# Patient Record
Sex: Female | Born: 1935
Health system: Southern US, Community
[De-identification: ages and names within clinical notes are randomized; demographics above are authoritative.]

## PROBLEM LIST (undated history)

## (undated) DIAGNOSIS — M810 Age-related osteoporosis without current pathological fracture: Secondary | ICD-10-CM

## (undated) DIAGNOSIS — I639 Cerebral infarction, unspecified: Secondary | ICD-10-CM

## (undated) DIAGNOSIS — E119 Type 2 diabetes mellitus without complications: Secondary | ICD-10-CM

## (undated) DIAGNOSIS — F32A Depression, unspecified: Secondary | ICD-10-CM

## (undated) DIAGNOSIS — S52509A Unspecified fracture of the lower end of unspecified radius, initial encounter for closed fracture: Secondary | ICD-10-CM

## (undated) DIAGNOSIS — S329XXA Fracture of unspecified parts of lumbosacral spine and pelvis, initial encounter for closed fracture: Secondary | ICD-10-CM

## (undated) DIAGNOSIS — F329 Major depressive disorder, single episode, unspecified: Secondary | ICD-10-CM

## (undated) DIAGNOSIS — I10 Essential (primary) hypertension: Secondary | ICD-10-CM

## (undated) DIAGNOSIS — M1990 Unspecified osteoarthritis, unspecified site: Secondary | ICD-10-CM

## (undated) DIAGNOSIS — D649 Anemia, unspecified: Secondary | ICD-10-CM

## (undated) DIAGNOSIS — S62101A Fracture of unspecified carpal bone, right wrist, initial encounter for closed fracture: Secondary | ICD-10-CM

## (undated) DIAGNOSIS — E785 Hyperlipidemia, unspecified: Secondary | ICD-10-CM

## (undated) HISTORY — DX: Fracture of unspecified carpal bone, right wrist, initial encounter for closed fracture: S62.101A

## (undated) HISTORY — DX: Major depressive disorder, single episode, unspecified: F32.9

## (undated) HISTORY — DX: Unspecified osteoarthritis, unspecified site: M19.90

## (undated) HISTORY — DX: Age-related osteoporosis without current pathological fracture: M81.0

## (undated) HISTORY — DX: Anemia, unspecified: D64.9

## (undated) HISTORY — DX: Unspecified fracture of the lower end of unspecified radius, initial encounter for closed fracture: S52.509A

## (undated) HISTORY — DX: Essential (primary) hypertension: I10

## (undated) HISTORY — DX: Hyperlipidemia, unspecified: E78.5

## (undated) HISTORY — DX: Depression, unspecified: F32.A

## (undated) HISTORY — DX: Type 2 diabetes mellitus without complications: E11.9

---

## 1958-08-27 HISTORY — PX: ABDOMINAL HYSTERECTOMY: SHX81

## 1977-08-27 DIAGNOSIS — I639 Cerebral infarction, unspecified: Secondary | ICD-10-CM

## 1977-08-27 HISTORY — DX: Cerebral infarction, unspecified: I63.9

## 2001-11-17 ENCOUNTER — Other Ambulatory Visit: Admission: RE | Admit: 2001-11-17 | Discharge: 2001-11-17 | Payer: Self-pay | Admitting: *Deleted

## 2006-07-08 ENCOUNTER — Inpatient Hospital Stay (HOSPITAL_COMMUNITY): Admission: EM | Admit: 2006-07-08 | Discharge: 2006-07-12 | Payer: Self-pay | Admitting: Emergency Medicine

## 2010-06-05 ENCOUNTER — Inpatient Hospital Stay (HOSPITAL_COMMUNITY): Admission: EM | Admit: 2010-06-05 | Discharge: 2010-06-08 | Payer: Self-pay | Admitting: Emergency Medicine

## 2010-06-06 ENCOUNTER — Encounter (INDEPENDENT_AMBULATORY_CARE_PROVIDER_SITE_OTHER): Payer: Self-pay | Admitting: Internal Medicine

## 2010-06-06 ENCOUNTER — Ambulatory Visit: Payer: Self-pay | Admitting: Vascular Surgery

## 2010-06-07 ENCOUNTER — Encounter (INDEPENDENT_AMBULATORY_CARE_PROVIDER_SITE_OTHER): Payer: Self-pay | Admitting: Internal Medicine

## 2010-06-19 ENCOUNTER — Encounter: Admission: RE | Admit: 2010-06-19 | Discharge: 2010-07-07 | Payer: Self-pay | Admitting: Internal Medicine

## 2010-11-09 LAB — COMPREHENSIVE METABOLIC PANEL
ALT: 14 U/L (ref 0–35)
Alkaline Phosphatase: 65 U/L (ref 39–117)
BUN: 10 mg/dL (ref 6–23)
CO2: 20 mEq/L (ref 19–32)
GFR calc non Af Amer: 50 mL/min — ABNORMAL LOW (ref >60)
Glucose, Bld: 177 mg/dL — ABNORMAL HIGH (ref 70–99)
Potassium: 4.4 mEq/L (ref 3.5–5.1)
Sodium: 139 mEq/L (ref 135–145)
Total Bilirubin: 0.6 mg/dL (ref 0.3–1.2)

## 2010-11-09 LAB — GLUCOSE, CAPILLARY
Glucose-Capillary: 117 mg/dL — ABNORMAL HIGH (ref 70–99)
Glucose-Capillary: 138 mg/dL — ABNORMAL HIGH (ref 70–99)
Glucose-Capillary: 194 mg/dL — ABNORMAL HIGH (ref 70–99)

## 2010-11-09 LAB — CBC
HCT: 30.6 % — ABNORMAL LOW (ref 36.0–46.0)
Hemoglobin: 9.8 g/dL — ABNORMAL LOW (ref 12.0–15.0)
MCHC: 32 g/dL (ref 30.0–36.0)
MCV: 95.5 fL (ref 78.0–100.0)
MCV: 95.6 fL (ref 78.0–100.0)
Platelets: 267 10*3/uL (ref 150–400)
RBC: 3.33 MIL/uL — ABNORMAL LOW (ref 3.87–5.11)
RDW: 13.6 % (ref 11.5–15.5)
WBC: 4.8 10*3/uL (ref 4.0–10.5)
WBC: 5.8 10*3/uL (ref 4.0–10.5)

## 2010-11-09 LAB — HEMOGLOBIN A1C
Hgb A1c MFr Bld: 8.1 % — ABNORMAL HIGH (ref <5.7)
Mean Plasma Glucose: 186 mg/dL — ABNORMAL HIGH (ref <117)

## 2010-11-09 LAB — BASIC METABOLIC PANEL
BUN: 13 mg/dL (ref 6–23)
Calcium: 8.4 mg/dL (ref 8.4–10.5)
Calcium: 9.1 mg/dL (ref 8.4–10.5)
Chloride: 111 mEq/L (ref 96–112)
Chloride: 112 mEq/L (ref 96–112)
Creatinine, Ser: 1.07 mg/dL (ref 0.4–1.2)
Creatinine, Ser: 1.09 mg/dL (ref 0.4–1.2)
GFR calc Af Amer: 58 mL/min — ABNORMAL LOW (ref >60)
GFR calc Af Amer: 59 mL/min — ABNORMAL LOW (ref >60)
GFR calc Af Amer: >60 mL/min (ref >60)
GFR calc non Af Amer: 48 mL/min — ABNORMAL LOW (ref >60)
GFR calc non Af Amer: 49 mL/min — ABNORMAL LOW (ref >60)
GFR calc non Af Amer: 51 mL/min — ABNORMAL LOW (ref >60)
Glucose, Bld: 160 mg/dL — ABNORMAL HIGH (ref 70–99)
Potassium: 4.4 mEq/L (ref 3.5–5.1)
Sodium: 137 mEq/L (ref 135–145)
Sodium: 139 mEq/L (ref 135–145)

## 2010-11-09 LAB — URINALYSIS, ROUTINE W REFLEX MICROSCOPIC
Bilirubin Urine: NEGATIVE
Glucose, UA: NEGATIVE mg/dL
Hgb urine dipstick: NEGATIVE
Protein, ur: NEGATIVE mg/dL
Urobilinogen, UA: 0.2 mg/dL (ref 0.0–1.0)

## 2010-11-09 LAB — DIFFERENTIAL
Basophils Absolute: 0 10*3/uL (ref 0.0–0.1)
Eosinophils Relative: 2 % (ref 0–5)
Lymphocytes Relative: 32 % (ref 12–46)
Lymphs Abs: 1.9 10*3/uL (ref 0.7–4.0)
Neutro Abs: 3.3 10*3/uL (ref 1.7–7.7)
Neutrophils Relative %: 58 % (ref 43–77)

## 2010-11-09 LAB — HEPATIC FUNCTION PANEL
Albumin: 3.6 g/dL (ref 3.5–5.2)
Alkaline Phosphatase: 62 U/L (ref 39–117)
Indirect Bilirubin: 0.6 mg/dL (ref 0.3–0.9)
Total Protein: 7 g/dL (ref 6.0–8.3)

## 2010-11-09 LAB — LIPID PANEL
Cholesterol: 162 mg/dL (ref 0–200)
HDL: 51 mg/dL (ref >39)
LDL Cholesterol: 97 mg/dL (ref 0–99)
Total CHOL/HDL Ratio: 3.2 RATIO

## 2010-11-09 LAB — MAGNESIUM: Magnesium: 1.7 mg/dL (ref 1.5–2.5)

## 2010-11-09 LAB — CK TOTAL AND CKMB (NOT AT ARMC)
CK, MB: 2.1 ng/mL (ref 0.3–4.0)
Relative Index: 0.5 (ref 0.0–2.5)
Total CK: 428 U/L — ABNORMAL HIGH (ref 7–177)

## 2010-11-09 LAB — APTT: aPTT: 28 seconds (ref 24–37)

## 2010-11-09 LAB — PROTIME-INR: INR: 1.01 (ref 0.00–1.49)

## 2010-11-09 LAB — AMMONIA: Ammonia: 21 umol/L (ref 11–35)

## 2010-11-26 ENCOUNTER — Emergency Department (HOSPITAL_COMMUNITY): Payer: No Typology Code available for payment source

## 2010-11-26 ENCOUNTER — Emergency Department (HOSPITAL_COMMUNITY)
Admission: EM | Admit: 2010-11-26 | Discharge: 2010-11-26 | Disposition: A | Discharge status: Home / Self Care | Payer: No Typology Code available for payment source | Attending: Emergency Medicine | Admitting: Emergency Medicine

## 2010-11-26 DIAGNOSIS — G8320 Monoplegia of upper limb affecting unspecified side: Secondary | ICD-10-CM | POA: Insufficient documentation

## 2010-11-26 DIAGNOSIS — E119 Type 2 diabetes mellitus without complications: Secondary | ICD-10-CM | POA: Insufficient documentation

## 2010-11-26 DIAGNOSIS — S139XXA Sprain of joints and ligaments of unspecified parts of neck, initial encounter: Secondary | ICD-10-CM | POA: Insufficient documentation

## 2010-11-26 DIAGNOSIS — M542 Cervicalgia: Secondary | ICD-10-CM | POA: Insufficient documentation

## 2010-11-26 DIAGNOSIS — Z8673 Personal history of transient ischemic attack (TIA), and cerebral infarction without residual deficits: Secondary | ICD-10-CM | POA: Insufficient documentation

## 2010-11-26 DIAGNOSIS — I10 Essential (primary) hypertension: Secondary | ICD-10-CM | POA: Insufficient documentation

## 2010-11-26 DIAGNOSIS — Z79899 Other long term (current) drug therapy: Secondary | ICD-10-CM | POA: Insufficient documentation

## 2011-01-12 NOTE — Discharge Summary (Signed)
NAME:  Patricia Andrews, Patricia Andrews               ACCOUNT NO.:  [id number]   MEDICAL RECORD NO.:  [id number]          PATIENT TYPE:  INP   LOCATION:  5040                         FACILITY:  [facility]   PHYSICIAN:  Margaretmary Bayley, M.D.    DATE OF BIRTH:  [birth year]   DATE OF ADMISSION:  07/08/2006  DATE OF DISCHARGE:  07/12/2006                                 DISCHARGE SUMMARY   DISCHARGE DIAGNOSES:  1. Fracture of the left inferior pubic ramus.  2. Type 2 diabetes mellitus.  3. Systemic hypertension.  4. Previous history of left intracerebral hemorrhage with a right      hemiparesis.   REASON FOR ADMISSION:  This is the first [facility] hospitalization for Ms.  Patricia Andrews.  She is a 77 year old African-American female who fell at home  sustaining injuries to her right pelvic area.  The patient spent the  following 5-7 days barely managing at home until it became apparent that her  pain was getting progressively worse and her ability to care for herself was  declining.  At this point, she told her daughter and she was brought into  the emergency room where she was x-rayed and found to have a left pelvic  fracture.   PERTINENT PHYSICAL FINDINGS:  VITAL SIGNS:  Blood pressure 122/82.  Pulse  rate of 72.  Respiratory rate of 20 and unlabored, and temperature is 98.7.  HEENT:  No conjunctivae pallor.  No scleral icterus.  EOMs are full.  No  nystagmus.  Her pharynx is without any exudates and no coating of the  tongue.  NECK:  Supple, no adenopathy, thyromegaly or jugular venous distention.  No  carotid bruits.  CHEST:  No splinting, no tenderness.  No breast tenderness or masses.  CARDIAC:  Her precordium is adynamic.  Heart sounds are intact and normal.  No murmurs, rubs or gallops.  ABDOMEN:  Nondistended, soft without any focal tenderness.  She does have  some tenderness in the area of the left pubic bone, no pelvic masses.  EXTREMITIES:  No clubbing, cyanosis or edema.  She does have contractures  of  the right upper extremity.  NEUROLOGIC:  She has weakness of the right upper extremity greater than the  left lower extremity, and as noted above, she does have a contracture of the  right arm.   Her CMET is normal with the exception of a slight elevation of blood glucose  to 142.  CBC is unremarkable.  Chest x-ray is without any acute infiltrates,  no masses.  She does have some mild bibasilar atelectasis. X-ray of the  pelvis reveals subtle changes suggestive of possible fracture of the  inferior pubic ramus on the left.   HISTORY:  The patient was admitted to the orthopedic floor where she was  started on her antihypertensive medications, her Amaryl for her diabetes,  Lovenox as a prophylactic agent to prevent any DVTs, and pain management was  not a particularly challenging problem because the patient stated that she  only had significant discomfort when trying to ambulate.  She was also  started on calcium and vitamin  D, Caltrate D 600 twice a day.  We resumed  her aspirin 81 mg per day.  She was initially started back on her Norvasc at  5 mg per day along with Avapro, but the patient became hypotensive  relatively speaking with a systolic blood pressure between 85-90.  Her  Avapro was subsequently discontinued and her Norvasc dropped to 2.5 mg per  day.  She had an uneventful hospitalization, and at this point in time, she  is medically fit to be transferred to a skilled nursing facility for  continued rehab and physical therapy.  Her discharge condition is  significantly improved.  She is discharged on a 3 gram sodium, 1400 calorie,  carb-modified diet.   DISCHARGE MEDICATIONS:  1. Vicodin 1 every 4 hours p.r.n. pain.  2. Xanax 0.5 mg at bedtime nightly with 0.25 mg b.i.d. p.r.n..  3. Norvasc 2.5 mg per day.  4. Ecotrin 81 mg per day.  5. Multivitamin once a day.  6. Protonix 40 mg per day.  7. Amaryl 4 mg daily.  8. Lovenox 40 mg subcu daily.  9. Caltrate D 1  b.i.d..           ______________________________  Margaretmary Bayley, M.D.     PC/MEDQ  D:  07/12/2006  T:  07/12/2006  Job:  [id number]

## 2011-06-28 ENCOUNTER — Emergency Department (HOSPITAL_COMMUNITY): Payer: Medicare Other

## 2011-06-28 ENCOUNTER — Encounter (HOSPITAL_COMMUNITY): Payer: Self-pay | Admitting: Radiology

## 2011-06-28 ENCOUNTER — Emergency Department (HOSPITAL_COMMUNITY)
Admission: EM | Admit: 2011-06-28 | Discharge: 2011-06-28 | Disposition: A | Discharge status: Home / Self Care | Payer: Medicare Other | Attending: Emergency Medicine | Admitting: Emergency Medicine

## 2011-06-28 DIAGNOSIS — G319 Degenerative disease of nervous system, unspecified: Secondary | ICD-10-CM | POA: Insufficient documentation

## 2011-06-28 DIAGNOSIS — Z8679 Personal history of other diseases of the circulatory system: Secondary | ICD-10-CM | POA: Insufficient documentation

## 2011-06-28 DIAGNOSIS — R5381 Other malaise: Secondary | ICD-10-CM | POA: Insufficient documentation

## 2011-06-28 DIAGNOSIS — I10 Essential (primary) hypertension: Secondary | ICD-10-CM | POA: Insufficient documentation

## 2011-06-28 DIAGNOSIS — E119 Type 2 diabetes mellitus without complications: Secondary | ICD-10-CM | POA: Insufficient documentation

## 2011-06-28 DIAGNOSIS — R5383 Other fatigue: Secondary | ICD-10-CM | POA: Insufficient documentation

## 2011-06-28 DIAGNOSIS — R29898 Other symptoms and signs involving the musculoskeletal system: Secondary | ICD-10-CM | POA: Insufficient documentation

## 2011-06-28 HISTORY — DX: Cerebral infarction, unspecified: I63.9

## 2011-06-28 LAB — CBC
HCT: 30.1 % — ABNORMAL LOW (ref 36.0–46.0)
Hemoglobin: 9.8 g/dL — ABNORMAL LOW (ref 12.0–15.0)
MCH: 30.9 pg (ref 26.0–34.0)
MCHC: 32.6 g/dL (ref 30.0–36.0)
RDW: 13 % (ref 11.5–15.5)

## 2011-06-28 LAB — COMPREHENSIVE METABOLIC PANEL
Albumin: 4.3 g/dL (ref 3.5–5.2)
Alkaline Phosphatase: 54 U/L (ref 39–117)
BUN: 37 mg/dL — ABNORMAL HIGH (ref 6–23)
Calcium: 9.4 mg/dL (ref 8.4–10.5)
Creatinine, Ser: 1.55 mg/dL — ABNORMAL HIGH (ref 0.50–1.10)
GFR calc Af Amer: 35 mL/min — ABNORMAL LOW (ref >90)
Glucose, Bld: 151 mg/dL — ABNORMAL HIGH (ref 70–99)
Potassium: 4.4 mEq/L (ref 3.5–5.1)
Total Protein: 8 g/dL (ref 6.0–8.3)

## 2011-06-28 LAB — URINALYSIS, ROUTINE W REFLEX MICROSCOPIC
Glucose, UA: NEGATIVE mg/dL
Ketones, ur: NEGATIVE mg/dL
Nitrite: NEGATIVE
Specific Gravity, Urine: 1.024 (ref 1.005–1.030)
pH: 5 (ref 5.0–8.0)

## 2011-06-28 LAB — URINE MICROSCOPIC-ADD ON

## 2011-06-28 LAB — DIFFERENTIAL
Basophils Absolute: 0 10*3/uL (ref 0.0–0.1)
Eosinophils Relative: 1 % (ref 0–5)
Lymphocytes Relative: 35 % (ref 12–46)
Monocytes Absolute: 0.3 10*3/uL (ref 0.1–1.0)
Monocytes Relative: 7 % (ref 3–12)
Neutro Abs: 2.6 10*3/uL (ref 1.7–7.7)

## 2011-06-28 LAB — HM DIABETES FOOT EXAM

## 2011-07-04 ENCOUNTER — Ambulatory Visit (INDEPENDENT_AMBULATORY_CARE_PROVIDER_SITE_OTHER): Payer: Medicare Other | Admitting: Neurology

## 2011-07-04 ENCOUNTER — Encounter: Payer: Self-pay | Admitting: Neurology

## 2011-07-04 ENCOUNTER — Other Ambulatory Visit (INDEPENDENT_AMBULATORY_CARE_PROVIDER_SITE_OTHER): Payer: Medicare Other

## 2011-07-04 DIAGNOSIS — I6350 Cerebral infarction due to unspecified occlusion or stenosis of unspecified cerebral artery: Secondary | ICD-10-CM

## 2011-07-04 DIAGNOSIS — I639 Cerebral infarction, unspecified: Secondary | ICD-10-CM

## 2011-07-04 DIAGNOSIS — F329 Major depressive disorder, single episode, unspecified: Secondary | ICD-10-CM

## 2011-07-04 LAB — TSH: TSH: 3.15 u[IU]/mL (ref 0.35–5.50)

## 2011-07-04 NOTE — Patient Instructions (Signed)
Go to the basement to have your lab work drawn.  Your appointment with Dr. Dellia Cloud is Wedneday, November 14th at 10:30am.  [phone].  Your MRI and MRA's have been scheduled for Wednesday, November 21st at 8:00am.  Please arrive to [facility] MRI by 7:45am  [phone].  Your follow up appointment with Dr. Modesto Charon is on Wednesday, December 19th at 10:30am.

## 2011-07-04 NOTE — Progress Notes (Signed)
Dear Dr. Thad Ranger,  Thank you for having me see Patricia Andrews in consultation today at [facility] for her problem with worsening weakness and faitgue.  As you may recall, she is a 77 y.o. year old female with a history of a right MCA stroke and diabetes leaving her with a spastic left hemiparesis that was sustained 33 years ago.  Last October she began to develop worsening right sided weakness and all over body fatigue.  She was admitted to [facility] and diagnosed with a TIA, although the worsening weakness that comes and goes has been persistent.   MRI revealed old encephalomalacia in the right MCA territory.  There was attenuation of flow in the right M1 branch although this may be due to the stroke itself.  She apparently saw you in the emergency department when she was seen on 11/1.  The reason for the visit was not an acute event, but rather it was the way her daughter(Patricia Andrews) who accompanied her today felt was the only way to get neurological consultation for her weakness.  At that time she was diagnosed with an anemia of unknown etiology and a U/A revealed positive leukoesterase.  She also complains of all over body fatigue.  She has early morning awakening typically when she has to get up and go the bathroom.  She is not able to get back to sleep.  She admits to depression.  She used to be an extraordinarily outgoing person but has had a general decline in her interactivity.  She gets agitated with people complaining about their ailments.  She stopped driving two years ago.  She does not like to go and socialized with peers in her independent living facility.  She does have falls and fell two years ago and broke her pelvis.  Past Medical History  Diagnosis Date  . Stroke   Diabetes - osteoporosis - htn   History   Social History  . Marital Status: Married    Spouse Name: N/A    Number of Children: N/A  . Years of Education: N/A   Social History Main Topics  . Smoking  status: Never Smoker   . Smokeless tobacco: Never Used  . Alcohol Use: None  . Drug Use: None  . Sexually Active: None   Other Topics Concern  . None   Social History Narrative  . None   Family History:  no significant family history of neurologic disease.   ROS:  13 systems were reviewed and are notable for depression and anxiety, chronic left sided weakness, athritis.  All other review of systems are unremarkable.   Examination:  Filed Vitals:   07/04/11 0921  BP: 140/72  Pulse: 92  Weight: 116 lb (52.617 kg)     In general, well appearing older woman in NAD.  Cardiovascular: The patient has a regular rate and rhythm and no carotid bruits.  Fundoscopy:  Limited by constricted pupils.  Mental status:   The patient is oriented to person, place and time. Recent and remote memory are intact. Attention span and concentration are normal. Language including repetition, naming, following commands are intact. Fund of knowledge of current and historical events, as well as vocabulary are normal.  Cranial Nerves: Pupils are equally round and reactive to light. Visual fields full to confrontation. Extraocular movements are intact without nystagmus. Facial sensation and muscles of mastication are intact. Muscles of facial expression are symmetric. Hearing intact to bilateral finger rub. Tongue protrusion, uvula, palate midline.  Shoulder shrug delayed  on the left.  Motor:  A spastic left hemiparesis with 0/5 strength in the LUE, 2/5 right HF, 4-/5 Knee Extension and Flexion, DF PF not checked due to lower extremity brace.    Reflexes:   Biceps  Triceps Brachioradialis Knee Ankle  Right 2+  2+  2+   2+ 0  Left  2+  2+  2+   3+ 3+  Toes not checked  Coordination:  Normal finger to nose on the right.  Sensation is decreased in the feet to temperature and vibration in a length dependent manner.  Also, reduce vibration, temperature on the lefthemibody in the arms and  legs.  Gait and Station revealed left spastic hemiparesis.  MRI brain from 2011 was reviewed and revealed a old right MCA territory infarct with relative sparing of the right parietal lobe.  MRA head revealed some attenuation of blood flow in the right M1, but I think this is likely due to the old stroke and decreased flow to that site and not necessarily a stenosis.   Impression: I think Patricia Andrews is likely experiencing worsening of her right sided weakness secondary to senescence/anamnestic affect.  I think another major contributor is her mood, poor sleep, anemia that is making her overall fatigue worse.  I am not convinced she ever had a TIA in the first place as the waxing and waning nature of her complaints in October 2011 do not sound vascular in nature.   Recommendations: I am going to get an MRI of her brain and MRA of her head and neck.  I am looking for signs of new infarctions.  I expect this to be negative.  I would like to have her see Dr. Caralyn Guile for therapy for her depression.  I am hopeful he can make a suggestion around a geriatric psychiatrist.  The patient is going to look into getting a new PCP who may be willing to start her on treatment for her depression.  She also needs workup of her anemia to look at its etiology.  I am going to get a TSH level today.   We will see the patient back in 6 weeks to discuss her results.  Thank you for having [place] see Patricia Andrews in consultation.  Feel free to contact me with any questions.  Lupita Raider Modesto Charon, MD [facility],  [address] [place], [place] [address] Phone: [phone] Fax: [phone].

## 2011-07-08 ENCOUNTER — Encounter: Payer: Self-pay | Admitting: Neurology

## 2011-07-09 NOTE — Progress Notes (Signed)
Spoke to pt's daughter regarding PCP. They are in transition and do not have a primary care physician. She will call us back later this week with new information.

## 2011-07-11 ENCOUNTER — Ambulatory Visit (INDEPENDENT_AMBULATORY_CARE_PROVIDER_SITE_OTHER): Payer: 59 | Admitting: Psychology

## 2011-07-11 DIAGNOSIS — F4323 Adjustment disorder with mixed anxiety and depressed mood: Secondary | ICD-10-CM

## 2011-07-12 NOTE — Progress Notes (Signed)
Pt's daughter called to update. They are still shopping for a new doctor, but they are continuing with Dr. Margaretmary Bayley until they find another. Faxed note to Dr. Chestine Spore 07/12/2011. Pt will call back once she switches Drs.

## 2011-07-18 ENCOUNTER — Ambulatory Visit (HOSPITAL_COMMUNITY)
Admission: RE | Admit: 2011-07-18 | Discharge: 2011-07-18 | Disposition: A | Discharge status: Home / Self Care | Payer: Medicare Other | Source: Ambulatory Visit | Attending: Neurology | Admitting: Neurology

## 2011-07-18 ENCOUNTER — Other Ambulatory Visit: Payer: Self-pay | Admitting: Neurology

## 2011-07-18 DIAGNOSIS — I639 Cerebral infarction, unspecified: Secondary | ICD-10-CM

## 2011-07-18 DIAGNOSIS — G9389 Other specified disorders of brain: Secondary | ICD-10-CM | POA: Insufficient documentation

## 2011-07-18 DIAGNOSIS — Z8673 Personal history of transient ischemic attack (TIA), and cerebral infarction without residual deficits: Secondary | ICD-10-CM | POA: Insufficient documentation

## 2011-07-18 DIAGNOSIS — R5381 Other malaise: Secondary | ICD-10-CM | POA: Insufficient documentation

## 2011-07-23 ENCOUNTER — Telehealth: Payer: Self-pay | Admitting: Neurology

## 2011-07-23 NOTE — Telephone Encounter (Signed)
Mailed a letter to the patient stating recent lab work was normal.

## 2011-07-23 NOTE — Telephone Encounter (Signed)
I don't feel comfortable diagnosing or treating her anemia unfortunately, not really my area of focus.  She was supposed to be looking for a PCP - wondering if she found someone yet, as they would be the best ones to address that.  If not we can recommend someone in [place].

## 2011-07-23 NOTE — Telephone Encounter (Signed)
I called and spoke directly to the pt. Information given as per Dr. Modesto Charon...MRI examination of the brain showed no new stroke and the MRA showed that the blood vessels were unchanged since last viewed in 2011.  The patient was relieved to get this news but did want me to check with Dr. Modesto Charon about her anemia and fatigue. She is hoping he can give her something to take to help her with these problems. I told her I would check with Dr. Modesto Charon and we would get back with her. The patient then asked me to call her daughter, Evette Georges at [phone] and give her this news as well. I told her I would and called her as soon as I hung up with the patient. The dame information was shared with her as per Dr. Modesto Charon. No other issues. Dr. Modesto Charon, please advise patient's concern re: anemia and fatigue. Thanks. Jan

## 2011-07-23 NOTE — Telephone Encounter (Signed)
Message copied by Benay Spice on Mon Jul 23, 2011  4:30 PM ------      Message from: Milas Gain      Created: Mon Jul 23, 2011 10:26 AM       Reviewed MRI brain, MRA head and neck.  No new infarctions since 2011 MRI.  I believe the attenuation of flow in the right M1 may be due to the encephalomalacia in that are and the increased demand.  If it is an actual stenosis it has not changed.            Jan - Could you call Patricia Andrews and let her know that her MRI did not show any new strokes since the one in 2011 and her blood vessels look the same.  Thx.  Judie Petit

## 2011-07-23 NOTE — Telephone Encounter (Signed)
Message copied by Benay Spice on Mon Jul 23, 2011 12:43 PM ------      Message from: Denton Meek H      Created: Mon Jul 23, 2011 10:26 AM       Reviewed MRI brain, MRA head and neck.  No new infarctions since 2011 MRI.  I believe the attenuation of flow in the right M1 may be due to the encephalomalacia in that are and the increased demand.  If it is an actual stenosis it has not changed.            Jan - Could you call Ms. Sharpe and let her know that her MRI did not show any new strokes since the one in 2011 and her blood vessels look the same.  Thx.  Judie Petit

## 2011-07-24 ENCOUNTER — Telehealth: Payer: Self-pay | Admitting: Neurology

## 2011-07-24 NOTE — Telephone Encounter (Signed)
See above phone note.  

## 2011-07-24 NOTE — Telephone Encounter (Signed)
Called and spoke with the patient's daughter, [name]. Information given to her as per Dr. Modesto Charon re: anemia and fatigue. The patient is seeing Dr. Vickii Chafe this Friday. They will address her fatigue and depression at that time. She also has an appt with Dr. Felicity Coyer in January as her new PCP. No other issues.

## 2011-07-27 ENCOUNTER — Ambulatory Visit (INDEPENDENT_AMBULATORY_CARE_PROVIDER_SITE_OTHER): Payer: 59 | Admitting: Psychology

## 2011-07-27 DIAGNOSIS — F331 Major depressive disorder, recurrent, moderate: Secondary | ICD-10-CM

## 2011-07-27 DIAGNOSIS — F411 Generalized anxiety disorder: Secondary | ICD-10-CM

## 2011-08-08 ENCOUNTER — Ambulatory Visit: Payer: 59 | Admitting: Psychology

## 2011-08-10 ENCOUNTER — Ambulatory Visit: Payer: 59 | Admitting: Psychology

## 2011-08-15 ENCOUNTER — Encounter: Payer: Self-pay | Admitting: Neurology

## 2011-08-15 ENCOUNTER — Ambulatory Visit (INDEPENDENT_AMBULATORY_CARE_PROVIDER_SITE_OTHER): Payer: Medicare Other | Admitting: Neurology

## 2011-08-15 VITALS — BP 150/80 | HR 80 | Wt 119.0 lb

## 2011-08-15 DIAGNOSIS — Z8673 Personal history of transient ischemic attack (TIA), and cerebral infarction without residual deficits: Secondary | ICD-10-CM

## 2011-08-15 DIAGNOSIS — I6930 Unspecified sequelae of cerebral infarction: Secondary | ICD-10-CM
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
## 2011-08-15 NOTE — Patient Instructions (Signed)
Follow up only as needed

## 2011-09-03 ENCOUNTER — Ambulatory Visit (INDEPENDENT_AMBULATORY_CARE_PROVIDER_SITE_OTHER): Payer: 59 | Admitting: Psychology

## 2011-09-03 DIAGNOSIS — F331 Major depressive disorder, recurrent, moderate: Secondary | ICD-10-CM

## 2011-09-03 DIAGNOSIS — F411 Generalized anxiety disorder: Secondary | ICD-10-CM

## 2011-09-14 ENCOUNTER — Ambulatory Visit: Payer: Medicare Other | Admitting: Internal Medicine

## 2011-09-20 ENCOUNTER — Encounter: Payer: Self-pay | Admitting: Internal Medicine

## 2011-09-20 ENCOUNTER — Ambulatory Visit (INDEPENDENT_AMBULATORY_CARE_PROVIDER_SITE_OTHER): Payer: Medicare Other | Admitting: Internal Medicine

## 2011-09-20 ENCOUNTER — Other Ambulatory Visit (INDEPENDENT_AMBULATORY_CARE_PROVIDER_SITE_OTHER): Payer: Medicare Other

## 2011-09-20 VITALS — BP 142/68 | HR 75 | Temp 98.4°F | Ht 63.0 in | Wt 114.0 lb

## 2011-09-20 DIAGNOSIS — F418 Other specified anxiety disorders: Secondary | ICD-10-CM

## 2011-09-20 DIAGNOSIS — R5383 Other fatigue: Secondary | ICD-10-CM

## 2011-09-20 DIAGNOSIS — I69959 Hemiplegia and hemiparesis following unspecified cerebrovascular disease affecting unspecified side: Secondary | ICD-10-CM

## 2011-09-20 DIAGNOSIS — H6120 Impacted cerumen, unspecified ear: Secondary | ICD-10-CM

## 2011-09-20 DIAGNOSIS — F341 Dysthymic disorder: Secondary | ICD-10-CM

## 2011-09-20 DIAGNOSIS — E1159 Type 2 diabetes mellitus with other circulatory complications: Secondary | ICD-10-CM | POA: Insufficient documentation

## 2011-09-20 DIAGNOSIS — E119 Type 2 diabetes mellitus without complications: Secondary | ICD-10-CM

## 2011-09-20 DIAGNOSIS — R5381 Other malaise: Secondary | ICD-10-CM

## 2011-09-20 DIAGNOSIS — E1169 Type 2 diabetes mellitus with other specified complication: Secondary | ICD-10-CM | POA: Insufficient documentation

## 2011-09-20 DIAGNOSIS — M810 Age-related osteoporosis without current pathological fracture: Secondary | ICD-10-CM | POA: Insufficient documentation

## 2011-09-20 DIAGNOSIS — Z23 Encounter for immunization: Secondary | ICD-10-CM

## 2011-09-20 DIAGNOSIS — I69359 Hemiplegia and hemiparesis following cerebral infarction affecting unspecified side: Secondary | ICD-10-CM | POA: Insufficient documentation

## 2011-09-20 DIAGNOSIS — E785 Hyperlipidemia, unspecified: Secondary | ICD-10-CM

## 2011-09-20 DIAGNOSIS — D649 Anemia, unspecified: Secondary | ICD-10-CM | POA: Insufficient documentation

## 2011-09-20 DIAGNOSIS — I69354 Hemiplegia and hemiparesis following cerebral infarction affecting left non-dominant side: Secondary | ICD-10-CM | POA: Insufficient documentation

## 2011-09-20 DIAGNOSIS — I10 Essential (primary) hypertension: Secondary | ICD-10-CM | POA: Insufficient documentation

## 2011-09-20 DIAGNOSIS — Z79899 Other long term (current) drug therapy: Secondary | ICD-10-CM

## 2011-09-20 DIAGNOSIS — H6122 Impacted cerumen, left ear: Secondary | ICD-10-CM

## 2011-09-20 LAB — BASIC METABOLIC PANEL
BUN: 16 mg/dL (ref 6–23)
Calcium: 9.4 mg/dL (ref 8.4–10.5)
GFR: 44.67 mL/min — ABNORMAL LOW (ref >60.00)
Glucose, Bld: 159 mg/dL — ABNORMAL HIGH (ref 70–99)
Sodium: 139 mEq/L (ref 135–145)

## 2011-09-20 LAB — HEPATIC FUNCTION PANEL
Albumin: 4.1 g/dL (ref 3.5–5.2)
Alkaline Phosphatase: 45 U/L (ref 39–117)
Total Bilirubin: 0.5 mg/dL (ref 0.3–1.2)

## 2011-09-20 LAB — IBC PANEL
Iron: 64 ug/dL (ref 42–145)
Transferrin: 230.4 mg/dL (ref 212.0–360.0)

## 2011-09-20 LAB — CBC WITH DIFFERENTIAL/PLATELET
Basophils Absolute: 0 10*3/uL (ref 0.0–0.1)
Lymphocytes Relative: 32.9 % (ref 12.0–46.0)
Monocytes Relative: 8.5 % (ref 3.0–12.0)
Platelets: 225 10*3/uL (ref 150.0–400.0)
RDW: 13.5 % (ref 11.5–14.6)

## 2011-09-20 LAB — LIPID PANEL
Cholesterol: 185 mg/dL (ref 0–200)
HDL: 89.5 mg/dL (ref >39.00)
VLDL: 12.2 mg/dL (ref 0.0–40.0)

## 2011-09-20 LAB — HEMOGLOBIN A1C: Hgb A1c MFr Bld: 6.1 % (ref 4.6–6.5)

## 2011-09-20 LAB — TSH: TSH: 1.96 u[IU]/mL (ref 0.35–5.50)

## 2011-09-20 NOTE — Assessment & Plan Note (Signed)
History of pelvic fracture following accidental fall 2006. On calcium supplement and weekly bisphosphonate since that time. Will send for prior records including last DEXA and consider other treatment if needed No bone pain or current complaints

## 2011-09-20 NOTE — Progress Notes (Signed)
Subjective:    Patient ID: Patricia Andrews, female    DOB: [birth year], 78 y.o.   MRN: [id number]  HPI New pt to me and our division, prev PCP with Dr Audrie Lia - here to establish care  Reviewed chronic medical issues today  Hx CVA 1979, residual L HP - recurrent TIA symptoms 06/2011, s/p neuro reeval - medical mgmt ongoing; the patient reports compliance with medication(s) as prescribed. Denies adverse side effects.  DM2 - the patient reports compliance with medication(s) as prescribed. Denies adverse side effects. Denies symptoms or evidence for hypo-or hyperglycemia. No polyuria, polydipsia. Weight is stable  HTN - the patient reports compliance with medication(s) as prescribed. Denies adverse side effects. No chest pain, headache or vision changes. No edema  Dyslipidemia - on statin - the patient reports compliance with medication(s) as prescribed. Denies adverse side effects.  Also complains of fatigue. anemia. Remote B12 shots provided by prior PCP. Denies diarrhea blood per rectum or melena. No epigastric pain. No known history of anemia. No prior colonoscopy. No unexpected weight changes or fever.  Past Medical History  Diagnosis Date  . Stroke 1980    R MCA>>L HP, spastic; TIA 06/2011  . Diabetes mellitus   . Hyperlipidemia   . Hypertension   . Arthritis   . Depression     anxiety overlap  . Allergic rhinitis    Family History  Problem Relation Age of Onset  . Arthritis Mother   . Arthritis Father    History  Substance Use Topics  . Smoking status: Never Smoker   . Smokeless tobacco: Never Used  . Alcohol Use: No    Review of Systems Constitutional: Negative for fever or unexpected weight change.  Respiratory: Negative for cough and shortness of breath.   Cardiovascular: Negative for chest pain or palpitations.  Gastrointestinal: Negative for abdominal pain, no bowel changes.  Musculoskeletal: Negative for new gait problem or joint swelling.  Skin: Negative for  rash.  Neurological: Negative for dizziness or headache.  No other specific complaints in a complete review of systems (except as listed in HPI above).     Objective:   Physical Exam BP 142/68  Pulse 75  Temp(Src) 98.4 F (36.9 C) (Oral)  Ht 5\' 3"  (1.6 m)  Wt 114 lb (51.71 kg)  BMI 20.19 kg/m2  SpO2 95% Wt Readings from Last 3 Encounters:  09/20/11 114 lb (51.71 kg)  08/15/11 119 lb (53.978 kg)  07/04/11 116 lb (52.617 kg)   Constitutional: She is petite, elderly but spry. appears well-developed and well-nourished. No distress Dr. [name] at side.  HENT: Head: Normocephalic and atraumatic. Ears: Left canal initially occluded with cerumen. After irrigation, B TMs ok, no erythema or effusion; Nose: Nose normal.  Mouth/Throat: Oropharynx is clear and moist. No oropharyngeal exudate.  Eyes: Conjunctivae and EOM are normal. Pupils are equal, round, and reactive to light. No scleral icterus.  Neck: Normal range of motion. Neck supple. No JVD present. No thyromegaly present.  Cardiovascular: Normal rate, regular rhythm and normal heart sounds.  No murmur heard. No BLE edema. Pulmonary/Chest: Effort normal and breath sounds normal. No respiratory distress. She has no wheezes.  Abdominal: Soft. Bowel sounds are normal. She exhibits no distension. There is no tenderness. no masses Musculoskeletal: Wears legs/AFO on left lower extremity. Spastic contraction of left upper extremity otherwise normal range of motion, no joint effusions. No gross deformities Neurological: She is alert and oriented to person, place, and time. No cranial nerve deficit. Chronic left  hemiparesis affecting upper and lower extremity. Gait independent with assistance of cane. Speech and recall are normal Skin: Skin is warm and dry. No rash noted. No erythema.  Psychiatric: She has a bright but minimally anxious mood and affect. Occasional tears in eyes when discussing depression symptoms prior to medication treatment. Her  behavior is normal. Judgment and thought content normal.   Lab Results  Component Value Date   WBC 4.6 06/28/2011   HGB 9.8* 06/28/2011   HCT 30.1* 06/28/2011   PLT 239 06/28/2011   GLUCOSE 151* 06/28/2011   CHOL  Value: 162        ATP III CLASSIFICATION:  <200     mg/dL   Desirable  161-096  mg/dL   Borderline High  >=045    mg/dL   High        40/98/1191   TRIG 70 06/07/2010   HDL 51 06/07/2010   LDLCALC  Value: 97        Total Cholesterol/HDL:CHD Risk Coronary Heart Disease Risk Table                     Men   Women  1/2 Average Risk   3.4   3.3  Average Risk       5.0   4.4  2 X Average Risk   9.6   7.1  3 X Average Risk  23.4   11.0        Use the calculated Patient Ratio above and the CHD Risk Table to determine the patient's CHD Risk.        ATP III CLASSIFICATION (LDL):  <100     mg/dL   Optimal  478-295  mg/dL   Near or Above                    Optimal  130-159  mg/dL   Borderline  621-308  mg/dL   High  >657     mg/dL   Very High 84/69/6295   ALT 8 06/28/2011   AST 12 06/28/2011   NA 136 06/28/2011   K 4.4 06/28/2011   CL 106 06/28/2011   CREATININE 1.55* 06/28/2011   BUN 37* 06/28/2011   CO2 17* 06/28/2011   TSH 3.15 07/04/2011   INR 1.01 06/05/2010   HGBA1C  Value: 8.1 (NOTE)                                                                       According to the ADA Clinical Practice Recommendations for 2011, when HbA1c is used as a screening test:   >=6.5%   Diagnostic of Diabetes Mellitus           (if abnormal result  is confirmed)  5.7-6.4%   Increased risk of developing Diabetes Mellitus  References:Diagnosis and Classification of Diabetes Mellitus,Diabetes Care,2011,34(Suppl 1):S62-S69 and Standards of Medical Care in         Diabetes - 2011,Diabetes Care,2011,34  (Suppl 1):S11-S61.* 06/05/2010    No results found for this basename: IRON, TIBC, FERRITIN   No results found for this basename: VITAMINB12   Procedure: wax removal Reason: wax impaction Risks and benefits of procedure  discussed with the patient who agrees to proceed. Ear(s) irrigated  with warm water. Large amount of wax removed. Instrumentation with metal ear loop was performed to accomplish wax removal. the patient tolerated procedure well.     Assessment & Plan:  Fatigue. Chronic and nonspecific. Suspect multifactorial related to anemia and depression. Reports improvement since initiation of antidepressants. Further evaluation of anemia as discussed below. History and exam benign. No medication changes recommended at this time. Check screening labs. Reassurance provided and will continue to follow.  Cerumen impaction, left ear. Initially associated with subacute hearing loss. Reports improvement following irrigation today. Family and patient will continue to observe and followup next office visit if other problems to consider audiology testing as needed  Also see problem list. Medications and labs reviewed today.

## 2011-09-20 NOTE — Assessment & Plan Note (Signed)
Uncertain chronicity. Will send for records. Recheck CBC along with iron studies and B12 Consider need for GI evaluation as never done previously No history or exam evidence for acute GI bleed

## 2011-09-20 NOTE — Patient Instructions (Signed)
It was good to see you today. We have reviewed your prior history and available records including labs and tests today Medications reviewed, no changes at this time. Test(s) ordered today. Your results will be called to you after review (48-72hours after test completion). If any changes need to be made, you will be notified at that time. Pneumonia shot updated today Ear irrigation for wax - let us know if you continue to have hearing problems Please schedule followup in 4-6 weeks to continue review, call sooner if problems.

## 2011-09-20 NOTE — Assessment & Plan Note (Signed)
On Amaryl and metformin, and denies symptoms of hypoglycemia. Check A1c now and titrate medications as needed Will send for prior records to review as available

## 2011-09-20 NOTE — Assessment & Plan Note (Signed)
On statin. No adverse complaints with medication treatment. Check labs now and titrate as needed especially with prior CVA and other co-morbid risk factors  

## 2011-09-20 NOTE — Assessment & Plan Note (Signed)
New diagnosis fall 2012 following TIA. Reviewed progressive symptoms of social withdrawal and dysphoria prior to initiation of SSRI. Following with psychology services and counseling. Much improved with SSRI medication - continue same in treatment as ongoing

## 2011-09-20 NOTE — Assessment & Plan Note (Signed)
Remote CVA, medical management ongoing. Reviewed recent hospitalization for TIA in fall 2012. No residual symptoms from most current event. Continue lower ext brace support as per orthopedics, Dr. Leslee Home Recent neuro note for same reviewed.

## 2011-10-01 ENCOUNTER — Telehealth: Payer: Self-pay | Admitting: *Deleted

## 2011-10-01 ENCOUNTER — Other Ambulatory Visit: Payer: Self-pay

## 2011-10-01 MED ORDER — SIMVASTATIN 20 MG PO TABS: 20.0000 mg | ORAL_TABLET | Freq: Every day | ORAL | Status: DC

## 2011-10-01 MED ORDER — CLOPIDOGREL BISULFATE 75 MG PO TABS: 75.0000 mg | ORAL_TABLET | Freq: Every day | ORAL | Status: DC

## 2011-10-01 MED ORDER — RISEDRONATE SODIUM 150 MG PO TABS: 150.0000 mg | ORAL_TABLET | ORAL | Status: DC

## 2011-10-01 MED ORDER — GLIMEPIRIDE 4 MG PO TABS: 4.0000 mg | ORAL_TABLET | Freq: Every day | ORAL | Status: DC

## 2011-10-01 MED ORDER — OLMESARTAN MEDOXOMIL 20 MG PO TABS: 20.0000 mg | ORAL_TABLET | Freq: Every day | ORAL | Status: DC

## 2011-10-01 MED ORDER — AMLODIPINE BESYLATE 5 MG PO TABS: 5.0000 mg | ORAL_TABLET | Freq: Every day | ORAL | Status: DC

## 2011-10-01 MED ORDER — METFORMIN HCL 500 MG PO TABS: 500.0000 mg | ORAL_TABLET | Freq: Two times a day (BID) | ORAL | Status: DC

## 2011-10-01 MED ORDER — ZOLPIDEM TARTRATE 10 MG PO TABS: 5.0000 mg | ORAL_TABLET | Freq: Every evening | ORAL | Status: DC | PRN

## 2011-10-01 MED ORDER — CITALOPRAM HYDROBROMIDE 10 MG PO TABS: 10.0000 mg | ORAL_TABLET | Freq: Every day | ORAL | Status: DC

## 2011-10-01 NOTE — Telephone Encounter (Signed)
Notified pt daughter [facility]... 10/01/11@3 :11pm/LMB

## 2011-10-01 NOTE — Telephone Encounter (Signed)
Faxed script for Patricia Andrews back to [facility] @ [phone]... 10/01/11@3 :04pm/LMB

## 2011-10-01 NOTE — Telephone Encounter (Signed)
[facility] did get the fax your office sent.  They advised there were 2 medications they did not receive that the pt takes.  These were the metformin 500mg  and actonel 150mg .  Please send these to the pharmacy and let me know they have been sent.

## 2011-10-18 ENCOUNTER — Ambulatory Visit: Payer: Medicare Other | Admitting: Internal Medicine

## 2011-10-18 DIAGNOSIS — Z0289 Encounter for other administrative examinations: Secondary | ICD-10-CM

## 2011-10-22 ENCOUNTER — Other Ambulatory Visit: Payer: Self-pay | Admitting: *Deleted

## 2011-10-22 DIAGNOSIS — I69359 Hemiplegia and hemiparesis following cerebral infarction affecting unspecified side: Secondary | ICD-10-CM

## 2011-10-22 DIAGNOSIS — R269 Unspecified abnormalities of gait and mobility: Secondary | ICD-10-CM

## 2011-10-22 NOTE — Telephone Encounter (Signed)
Pt's daughter is requesting letter of medical necessity and prescription for lift chair due to history of stroke and weakness on right side and also pelvic fracture from fall.

## 2011-10-22 NOTE — Telephone Encounter (Signed)
i usually defer DME needs to [facility] - i will refer to [facility] to eval for same and will rx what they recommend - thanks

## 2011-10-22 NOTE — Telephone Encounter (Signed)
Pt's informed of MD's advisement.

## 2011-10-30 ENCOUNTER — Telehealth: Payer: Self-pay | Admitting: *Deleted

## 2011-10-30 DIAGNOSIS — I69359 Hemiplegia and hemiparesis following cerebral infarction affecting unspecified side: Secondary | ICD-10-CM

## 2011-10-30 DIAGNOSIS — I69959 Hemiplegia and hemiparesis following unspecified cerebrovascular disease affecting unspecified side: Secondary | ICD-10-CM

## 2011-10-30 NOTE — Telephone Encounter (Signed)
Pt came over wanting to get a left chair. Requesting md to fax over script for lift chair to [phone]... 10/30/11@12 :30pm/LMB

## 2011-10-30 NOTE — Telephone Encounter (Signed)
Faxed script back to [facility] @ [phone]... 10/30/11@2 :04pm/LMB

## 2011-10-30 NOTE — Telephone Encounter (Signed)
DME order done - may print and fax as reuested

## 2011-12-18 ENCOUNTER — Encounter: Payer: Self-pay | Admitting: Internal Medicine

## 2011-12-18 ENCOUNTER — Ambulatory Visit (INDEPENDENT_AMBULATORY_CARE_PROVIDER_SITE_OTHER): Payer: Medicare Other | Admitting: Internal Medicine

## 2011-12-18 ENCOUNTER — Other Ambulatory Visit (INDEPENDENT_AMBULATORY_CARE_PROVIDER_SITE_OTHER): Payer: Medicare Other

## 2011-12-18 DIAGNOSIS — D649 Anemia, unspecified: Secondary | ICD-10-CM

## 2011-12-18 DIAGNOSIS — M810 Age-related osteoporosis without current pathological fracture: Secondary | ICD-10-CM

## 2011-12-18 DIAGNOSIS — E119 Type 2 diabetes mellitus without complications: Secondary | ICD-10-CM

## 2011-12-18 LAB — CBC WITH DIFFERENTIAL/PLATELET
Basophils Relative: 0.7 % (ref 0.0–3.0)
Eosinophils Absolute: 0.1 10*3/uL (ref 0.0–0.7)
Eosinophils Relative: 1.8 % (ref 0.0–5.0)
Lymphocytes Relative: 37.4 % (ref 12.0–46.0)
MCHC: 32.7 g/dL (ref 30.0–36.0)
Monocytes Absolute: 0.4 10*3/uL (ref 0.1–1.0)
Neutrophils Relative %: 50 % (ref 43.0–77.0)
Platelets: 214 10*3/uL (ref 150.0–400.0)
RBC: 3.27 Mil/uL — ABNORMAL LOW (ref 3.87–5.11)
WBC: 4 10*3/uL — ABNORMAL LOW (ref 4.5–10.5)

## 2011-12-18 MED ORDER — IBANDRONATE SODIUM 150 MG PO TABS: 150.0000 mg | ORAL_TABLET | ORAL | Status: DC

## 2011-12-18 MED ORDER — GLIMEPIRIDE 4 MG PO TABS: 4.0000 mg | ORAL_TABLET | Freq: Every day | ORAL | Status: DC

## 2011-12-18 MED ORDER — METFORMIN HCL 500 MG PO TABS: 500.0000 mg | ORAL_TABLET | Freq: Two times a day (BID) | ORAL | Status: DC

## 2011-12-18 MED ORDER — CLOPIDOGREL BISULFATE 75 MG PO TABS: 75.0000 mg | ORAL_TABLET | Freq: Every day | ORAL | Status: DC

## 2011-12-18 MED ORDER — SIMVASTATIN 20 MG PO TABS: 20.0000 mg | ORAL_TABLET | Freq: Every day | ORAL | Status: DC

## 2011-12-18 MED ORDER — OLMESARTAN MEDOXOMIL 20 MG PO TABS: 20.0000 mg | ORAL_TABLET | Freq: Every day | ORAL | Status: DC

## 2011-12-18 MED ORDER — AMLODIPINE BESYLATE 5 MG PO TABS: 5.0000 mg | ORAL_TABLET | Freq: Every day | ORAL | Status: DC

## 2011-12-18 NOTE — Assessment & Plan Note (Signed)
History of pelvic fracture following accidental fall 2006. On calcium supplement and weekly bisphosphonate since that time. Mild GI upset with actonel and low sugar while empty/waiting change to Boniva - consider Prolia if Boniva $ prohibitive No bone pain or current complaints

## 2011-12-18 NOTE — Progress Notes (Signed)
Subjective:    Patient ID: Patricia Andrews, female    DOB: 07/24/1926, 78 y.o.   MRN: [id number]  HPI  Here for follow up - reviewed chronic medical issues today  Hx CVA 1979, residual L HP - recurrent TIA symptoms 06/2011, s/p neuro reeval - medical mgmt ongoing; the patient reports compliance with medication(s) as prescribed. Denies adverse side effects.  DM2 - the patient reports compliance with medication(s) as prescribed. Denies adverse side effects. Denies symptoms or evidence for hypo-or hyperglycemia. No polyuria, polydipsia. Weight is stable  HTN - the patient reports compliance with medication(s) as prescribed. Denies adverse side effects. No chest pain, headache or vision changes. No edema  Dyslipidemia - on statin - the patient reports compliance with medication(s) as prescribed. Denies adverse side effects.  anemia. Remote B12 shots provided by prior PCP. Denies diarrhea blood per rectum or melena. No epigastric pain. No known history of anemia. No prior colonoscopy. No unexpected weight changes or fever.  Past Medical History  Diagnosis Date  . Stroke 1979    R MCA>>residual spastic L HP; TIA 06/2011  . Diabetes mellitus, type 2   . Hyperlipidemia   . Hypertension   . Arthritis   . Depression     anxiety overlap  . Allergic rhinitis   . Anemia   . Osteoporosis, senile     accidental fall w/ pelvic fx 2006    Review of Systems  Constitutional: Negative for fever or unexpected weight change.  Respiratory: Negative for cough and shortness of breath.   Cardiovascular: Negative for chest pain or palpitations.       Objective:   Physical Exam  BP 130/60  Pulse 77  Temp(Src) 98.5 F (36.9 C) (Oral)  Ht 5\' 3"  (1.6 m)  Wt 110 lb 6.4 oz (50.077 kg)  BMI 19.56 kg/m2  SpO2 98% Wt Readings from Last 3 Encounters:  12/18/11 110 lb 6.4 oz (50.077 kg)  09/20/11 114 lb (51.71 kg)  08/15/11 119 lb (53.978 kg)   Constitutional: She is petite, elderly but spry.  appears well-developed and well-nourished. No distress Drr [name] at side.  Eyes: Conjunctivae and EOM are normal. Pupils are equal, round, and reactive to light. No scleral icterus.  Neck: Normal range of motion. Neck supple. No JVD present. No thyromegaly present.  Cardiovascular: Normal rate, regular rhythm and normal heart sounds.  No murmur heard. No BLE edema. Pulmonary/Chest: Effort normal and breath sounds normal. No respiratory distress. She has no wheezes.  Musculoskeletal: Wears legs/AFO on left lower extremity. Spastic contraction of left upper extremity otherwise normal range of motion, no joint effusions. No gross deformities Neurological: She is alert and oriented to person, place, and time. No cranial nerve deficit. Chronic left hemiparesis affecting upper and lower extremity. Gait independent with assistance of cane. Speech and recall are normal Psychiatric: She has a bright but minimally anxious mood and affect. Occasional tears in eyes when discussing depression symptoms prior to medication treatment. Her behavior is normal. Judgment and thought content normal.   Lab Results  Component Value Date   WBC 4.2* 09/20/2011   HGB 10.5* 09/20/2011   HCT 31.7* 09/20/2011   PLT 225.0 09/20/2011   GLUCOSE 159* 09/20/2011   CHOL 185 09/20/2011   TRIG 61.0 09/20/2011   HDL 89.50 09/20/2011   LDLCALC 83 09/20/2011   ALT 12 09/20/2011   AST 20 09/20/2011   NA 139 09/20/2011   K 4.4 09/20/2011   CL 105 09/20/2011   CREATININE 1.2 09/20/2011  BUN 16 09/20/2011   CO2 26 09/20/2011   TSH 1.96 09/20/2011   INR 1.01 06/05/2010   HGBA1C 6.1 09/20/2011    Lab Results  Component Value Date   IRON 64 09/20/2011   Lab Results  Component Value Date   VITAMINB12 560 09/20/2011       Assessment & Plan:   Also see problem list. Medications and labs reviewed today.

## 2011-12-18 NOTE — Patient Instructions (Signed)
It was good to see you today. Medications reviewed: stop Actonel and start Boniva - no other changes at this time. Your prescription(s) and refills have been submitted to your pharmacy. Please take as directed and contact our office if you believe you are having problem(s) with the medication(s). Test(s) ordered today. Your results will be called to you after review (48-72hours after test completion). If any changes need to be made, you will be notified at that time. Please schedule followup in 3-4 months, call sooner if problems.

## 2011-12-18 NOTE — Assessment & Plan Note (Signed)
On Amaryl and metformin, and denies symptoms of hypoglycemia. Check A1c now and titrate medications as needed  Lab Results  Component Value Date   HGBA1C 6.1 09/20/2011

## 2011-12-18 NOTE — Assessment & Plan Note (Signed)
Uncertain chronicity. On oral iron Recheck CBC now Consider need for GI evaluation as never done previously No history or exam evidence for acute GI bleed

## 2012-03-05 ENCOUNTER — Ambulatory Visit (INDEPENDENT_AMBULATORY_CARE_PROVIDER_SITE_OTHER): Payer: 59 | Admitting: Psychology

## 2012-03-05 DIAGNOSIS — F331 Major depressive disorder, recurrent, moderate: Secondary | ICD-10-CM

## 2012-03-05 DIAGNOSIS — F411 Generalized anxiety disorder: Secondary | ICD-10-CM

## 2012-04-21 ENCOUNTER — Ambulatory Visit (INDEPENDENT_AMBULATORY_CARE_PROVIDER_SITE_OTHER): Payer: Medicare Other | Admitting: Internal Medicine

## 2012-04-21 ENCOUNTER — Other Ambulatory Visit (INDEPENDENT_AMBULATORY_CARE_PROVIDER_SITE_OTHER): Payer: 59

## 2012-04-21 ENCOUNTER — Encounter: Payer: Self-pay | Admitting: Internal Medicine

## 2012-04-21 VITALS — BP 138/60 | HR 80 | Temp 98.3°F | Ht 63.0 in | Wt 111.0 lb

## 2012-04-21 DIAGNOSIS — R636 Underweight: Secondary | ICD-10-CM

## 2012-04-21 DIAGNOSIS — D649 Anemia, unspecified: Secondary | ICD-10-CM

## 2012-04-21 DIAGNOSIS — E119 Type 2 diabetes mellitus without complications: Secondary | ICD-10-CM

## 2012-04-21 DIAGNOSIS — I10 Essential (primary) hypertension: Secondary | ICD-10-CM

## 2012-04-21 LAB — CBC WITH DIFFERENTIAL/PLATELET
Basophils Absolute: 0 10*3/uL (ref 0.0–0.1)
Basophils Relative: 0.6 % (ref 0.0–3.0)
Hemoglobin: 9.4 g/dL — ABNORMAL LOW (ref 12.0–15.0)
Lymphocytes Relative: 31.4 % (ref 12.0–46.0)
Monocytes Relative: 11.3 % (ref 3.0–12.0)
Neutro Abs: 2.4 10*3/uL (ref 1.4–7.7)
Neutrophils Relative %: 55.8 % (ref 43.0–77.0)
RBC: 3.05 Mil/uL — ABNORMAL LOW (ref 3.87–5.11)
RDW: 14.2 % (ref 11.5–14.6)

## 2012-04-21 LAB — HEMOGLOBIN A1C: Hgb A1c MFr Bld: 5.8 % (ref 4.6–6.5)

## 2012-04-21 MED ORDER — AMLODIPINE BESYLATE 5 MG PO TABS: 5.0000 mg | ORAL_TABLET | Freq: Every day | ORAL | Status: DC

## 2012-04-21 NOTE — Assessment & Plan Note (Signed)
Uncertain chronicity. On oral iron Recheck CBC now Refer for GI evaluation as never done previously given alternating bowels and inability to gain weight No history or exam evidence for acute GI bleed

## 2012-04-21 NOTE — Progress Notes (Signed)
Subjective:    Patient ID: Patricia Andrews, female    DOB: Feb 29, 1926, 78 y.o.   MRN: [id number]  HPI  Here for follow up - reviewed chronic medical issues today  Hx CVA 1979, residual L HP - recurrent TIA symptoms 06/2011, s/p neuro reeval - medical mgmt ongoing; the patient reports compliance with medication(s) as prescribed. Denies adverse side effects.  DM2 - the patient reports compliance with medication(s) as prescribed. Denies adverse side effects. Denies symptoms or evidence for hypo-or hyperglycemia. No polyuria, polydipsia. Weight is stable  HTN - the patient reports compliance with medication(s) as prescribed. Denies adverse side effects. No chest pain, headache or vision changes. No edema  Dyslipidemia - on statin - the patient reports compliance with medication(s) as prescribed. Denies adverse side effects.  anemia. Remote B12 shots provided by prior PCP, chronic iron intake. Occassional diarrhea and constipation, blood per rectum or melena. No epigastric pain. No prior colonoscopy. Feels unable to gain weight  Past Medical History  Diagnosis Date  . Stroke 1979    R MCA>>residual spastic L HP; TIA 06/2011  . Diabetes mellitus, type 2   . Hyperlipidemia   . Hypertension   . Arthritis   . Depression     anxiety overlap  . Allergic rhinitis   . Anemia   . Osteoporosis, senile     accidental fall w/ pelvic fx 2006    Review of Systems  Constitutional: Negative for fever or unexpected weight change.  Respiratory: Negative for cough and shortness of breath.   Cardiovascular: Negative for chest pain or palpitations.       Objective:   Physical Exam  BP 138/60  Pulse 80  Temp 98.3 F (36.8 C) (Oral)  Ht 5\' 3"  (1.6 m)  Wt 111 lb (50.349 kg)  BMI 19.66 kg/m2  SpO2 98% Wt Readings from Last 3 Encounters:  04/21/12 111 lb (50.349 kg)  12/18/11 110 lb 6.4 oz (50.077 kg)  09/20/11 114 lb (51.71 kg)   Constitutional: She is petite, elderly but spry. appears  well-developed and well-nourished. No distress Dtr Hazel at side.  Neck: Normal range of motion. Neck supple. No JVD present. No thyromegaly present.  Cardiovascular: Normal rate, regular rhythm and normal heart sounds.  No murmur heard. No BLE edema. Pulmonary/Chest: Effort normal and breath sounds normal. No respiratory distress. She has no wheezes.  Musculoskeletal: Wears legs/AFO on left lower extremity. Spastic contraction of left upper extremity otherwise normal range of motion, no joint effusions. No gross deformities Neurological: She is alert and oriented to person, place, and time. No cranial nerve deficit. Chronic left hemiparesis affecting upper and lower extremity. Gait independent with assistance of cane. Speech and recall are normal Psychiatric: She has a bright but minimally anxious mood and affect. Occasional tears in eyes when discussing depression symptoms prior to medication treatment. Her behavior is normal. Judgment and thought content normal.   Lab Results  Component Value Date   WBC 4.0* 12/18/2011   HGB 10.1* 12/18/2011   HCT 30.9* 12/18/2011   PLT 214.0 12/18/2011   GLUCOSE 159* 09/20/2011   CHOL 185 09/20/2011   TRIG 61.0 09/20/2011   HDL 89.50 09/20/2011   LDLCALC 83 09/20/2011   ALT 12 09/20/2011   AST 20 09/20/2011   NA 139 09/20/2011   K 4.4 09/20/2011   CL 105 09/20/2011   CREATININE 1.2 09/20/2011   BUN 16 09/20/2011   CO2 26 09/20/2011   TSH 1.96 09/20/2011   INR 1.01 06/05/2010  HGBA1C 5.8 12/18/2011    Lab Results  Component Value Date   IRON 64 09/20/2011   Lab Results  Component Value Date   VITAMINB12 560 09/20/2011       Assessment & Plan:   see problem list. Medications and labs reviewed today.

## 2012-04-21 NOTE — Assessment & Plan Note (Signed)
On Amaryl and metformin, and denies symptoms of hypoglycemia. Check A1c now and titrate medications as needed  Lab Results  Component Value Date   HGBA1C 5.8 12/18/2011

## 2012-04-21 NOTE — Assessment & Plan Note (Signed)
BP Readings from Last 3 Encounters:  04/21/12 138/60  12/18/11 130/60  09/20/11 142/68   The current medical regimen is effective;  continue present plan and medications.

## 2012-04-21 NOTE — Patient Instructions (Signed)
It was good to see you today. Medications reviewed- no changes at this time. we'll make referral to Dr. Arlyce Dice (GI) . Our office will contact you regarding appointment(s) once made. Test(s) ordered today. Your results will be called to you after review (48-72hours after test completion). If any changes need to be made, you will be notified at that time. Please schedule followup in 4 months, call sooner if problems.

## 2012-04-23 ENCOUNTER — Encounter: Payer: Self-pay | Admitting: Gastroenterology

## 2012-05-27 ENCOUNTER — Telehealth: Payer: Self-pay | Admitting: *Deleted

## 2012-05-27 ENCOUNTER — Encounter: Payer: Self-pay | Admitting: Gastroenterology

## 2012-05-27 ENCOUNTER — Ambulatory Visit (INDEPENDENT_AMBULATORY_CARE_PROVIDER_SITE_OTHER): Payer: Medicare Other | Admitting: Gastroenterology

## 2012-05-27 VITALS — BP 146/72 | HR 88 | Ht 63.25 in | Wt 110.0 lb

## 2012-05-27 DIAGNOSIS — I69359 Hemiplegia and hemiparesis following cerebral infarction affecting unspecified side: Secondary | ICD-10-CM

## 2012-05-27 DIAGNOSIS — D649 Anemia, unspecified: Secondary | ICD-10-CM

## 2012-05-27 DIAGNOSIS — I69959 Hemiplegia and hemiparesis following unspecified cerebrovascular disease affecting unspecified side: Secondary | ICD-10-CM

## 2012-05-27 NOTE — Assessment & Plan Note (Signed)
Patient remains on Plavix

## 2012-05-27 NOTE — Telephone Encounter (Signed)
  05/27/2012    RE: Patricia Andrews DOB: 27-Jul-1926 MRN: [id number]   Dear Dr. Felicity Coyer    We have scheduled the above patient for an endoscopic procedure. Our records show that she is on anticoagulation therapy.   Please advise as to how long the patient may come off her therapy of Plavix prior to the procedure, which is scheduled for June 06, 2012.  Please fax back/ or route the completed form to Merri Ray, CMA at [phone].   Sincerely,  Ok Anis

## 2012-05-27 NOTE — Telephone Encounter (Signed)
As patient's last TIA greater than 6 months ago, okay to hold Plavix for 5 days before procedure.  Should resume as soon after procedure as gastroenterologist permits.  Patient is at increased risk of recurrent TIA/CVA while holding antiplatelet medication, but I feel potential benefit outweighs any potential risk for this evaluation of her anemia

## 2012-05-27 NOTE — Progress Notes (Signed)
History of Present Illness:  Pleasant 78 year old Afro-American female with history of CVA, on Plavix, referred for evaluation of anemia.  Over the past 10 months her hemoglobin has dropped from 10.5-9.4. She was started on supplementary iron. She's had no rectal bleeding, change in bowel habits or abdominal pain. She's on no gastric irritants including nonsteroidals. There is no history of peptic ulcer disease. Patient generally feels well.    Past Medical History  Diagnosis Date  . Stroke 1979    R MCA>>residual spastic L HP; TIA 06/2011  . Diabetes mellitus, type 2   . Hyperlipidemia   . Hypertension   . Arthritis   . Depression     anxiety overlap  . Allergic rhinitis   . Anemia   . Osteoporosis, senile     accidental fall w/ pelvic fx 2006   Past Surgical History  Procedure Date  . Abdominal hysterectomy 1960    Fibroid tumors   family history includes Arthritis in her father and mother. Current Outpatient Prescriptions  Medication Sig Dispense Refill  . amLODipine (NORVASC) 5 MG tablet Take 1 tablet (5 mg total) by mouth daily.  30 tablet  5  . citalopram (CELEXA) 10 MG tablet Take 1 tablet (10 mg total) by mouth daily.  90 tablet  1  . clopidogrel (PLAVIX) 75 MG tablet Take 1 tablet (75 mg total) by mouth daily.  90 tablet  2  . ferrous sulfate 325 (65 FE) MG tablet Take 325 mg by mouth daily with breakfast.      . glimepiride (AMARYL) 4 MG tablet Take 1 tablet (4 mg total) by mouth daily before breakfast.  90 tablet  2  . ibandronate (BONIVA) 150 MG tablet Take 1 tablet (150 mg total) by mouth every 30 (thirty) days. Take in the morning with a full glass of water, on an empty stomach, and do not take anything else by mouth or lie down for the next 30 min.  4 tablet  3  . metFORMIN (GLUCOPHAGE) 500 MG tablet Take 1 tablet (500 mg total) by mouth 2 (two) times daily with a meal.  180 tablet  2  . olmesartan (BENICAR) 20 MG tablet Take 1 tablet (20 mg total) by mouth daily.  90  tablet  2  . simvastatin (ZOCOR) 20 MG tablet Take 1 tablet (20 mg total) by mouth at bedtime.  90 tablet  2  . zolpidem (AMBIEN) 5 MG tablet Take 5 mg by mouth at bedtime as needed.       Allergies as of 05/27/2012  . (No Known Allergies)    reports that she has never smoked. She has never used smokeless tobacco. She reports that she does not drink alcohol or use illicit drugs.     Review of Systems: She has a left hemiparesis as a result of her CVA Pertinent positive and negative review of systems were noted in the above HPI section. All other review of systems were otherwise negative.  Vital signs were reviewed in today's medical record Physical Exam: General: Well developed , well nourished, no acute distress Head: Normocephalic and atraumatic Eyes:  sclerae anicteric, EOMI Ears: Normal auditory acuity Mouth: No deformity or lesions Neck: Supple, no masses or thyromegaly Lungs: Clear throughout to auscultation Heart: Regular rate and rhythm; no murmurs, rubs or bruits Abdomen: Soft, non tender and non distended. No masses, hepatosplenomegaly or hernias noted. Normal Bowel sounds Rectal:deferred Musculoskeletal: Symmetrical with no gross deformities  Skin: No lesions on visible extremities Pulses:  Normal pulses noted Extremities: No clubbing, cyanosis, edema or deformities noted Neurological: Alert oriented x 4; there is a left hemiparesis Cervical Nodes:  No significant cervical adenopathy Inguinal Nodes: No significant inguinal adenopathy Psychological:  Alert and cooperative. Normal mood and affect   Results for Patricia, Andrews (MRN [id number]) as of 05/27/2012 11:03  Ref. Range 09/20/2011 10:55 12/18/2011 11:53 04/21/2012 11:44  WBC Latest Range: 4.5-10.5 K/uL 4.2 (L) 4.0 (L) 4.3 (L)  RBC Latest Range: 3.87-5.11 Mil/uL 3.37 (L) 3.27 (L) 3.05 (L)  Hemoglobin Latest Range: 12.0-15.0 g/dL 78.2 (L) 95.6 (L) 9.4 (L)  HCT Latest Range: 36.0-46.0 % 31.7 (L) 30.9 (L) 29.1 (L)  MCV  Latest Range: 78.0-100.0 fl 94.1 94.7 95.6  MCHC Latest Range: 30.0-36.0 g/dL 21.3 08.6 57.8  RDW Latest Range: 11.5-14.6 % 13.5 14.0 14.2  Platelets Latest Range: 150.0-400.0 K/uL 225.0 214.0 238.0

## 2012-05-27 NOTE — Assessment & Plan Note (Addendum)
Patient has a nonspecific anemia. There is no evidence of overt GI blood loss. She has never had a colonoscopy.  Recommendations #1 colonoscopy; I will check with her PCP whether Plavix can be held #2 Hemoccults  Because of the patient's limited mobility she will be admitted to observation for assistance with the colon prep and then have her procedure later in the day and discharged

## 2012-05-27 NOTE — Patient Instructions (Addendum)
Your procedure for your colonoscopy is scheduled for June 06, 2012. You will be contacted from bed placement from [facility] about your arrival time to be admitted for procedure   Your prep for your colonoscopy will be done the morning at the hospital   You will be contacted by our office prior to your procedure for directions on holding your Plavix.  If you do not hear from our office 1 week prior to your scheduled procedure, please call [phone] to discuss.

## 2012-05-30 NOTE — Telephone Encounter (Signed)
SPOKE WITH PTS DAUGHTER. [phone] Patricia Andrews  SHE IS AWARE THAT HER MOTHER IS TO HOLD HER PLAVIX STARTING Sunday  SHE WILL CONTACT BED PLACEMENT ON THE 10 TH TO REMIND THEM THAT HER MOTHER IS SUPPOSE TO BE ADMITTED ON THE 11TH IN THE AM FIRST THING TO DO HER PREP

## 2012-05-30 NOTE — Telephone Encounter (Signed)
PT CAN HOLD PLAVIX 5 DAYS  BEFORE PROCEDURE  TRIED TO CONTACT PT BUT NO ANSWER 2ND ATTEMPT

## 2012-06-04 ENCOUNTER — Telehealth: Payer: Self-pay | Admitting: *Deleted

## 2012-06-04 NOTE — Telephone Encounter (Signed)
Called daughter to inform her of [facility] decision to admit her mother a day early for prep.  Waiting on call back from daughter before calling bed placement

## 2012-06-04 NOTE — Telephone Encounter (Signed)
Pts daughter aware she will get a call from hospital....Marland Kitchentomorrow

## 2012-06-05 ENCOUNTER — Observation Stay (HOSPITAL_COMMUNITY)
Admission: RE | Admit: 2012-06-05 | Discharge: 2012-06-06 | Disposition: A | Discharge status: Home / Self Care | Payer: Medicare Other | Source: Ambulatory Visit | Attending: Gastroenterology | Admitting: Gastroenterology

## 2012-06-05 ENCOUNTER — Encounter (HOSPITAL_COMMUNITY): Payer: Self-pay | Admitting: *Deleted

## 2012-06-05 DIAGNOSIS — E119 Type 2 diabetes mellitus without complications: Secondary | ICD-10-CM | POA: Insufficient documentation

## 2012-06-05 DIAGNOSIS — E785 Hyperlipidemia, unspecified: Secondary | ICD-10-CM | POA: Insufficient documentation

## 2012-06-05 DIAGNOSIS — D649 Anemia, unspecified: Secondary | ICD-10-CM

## 2012-06-05 DIAGNOSIS — I69359 Hemiplegia and hemiparesis following cerebral infarction affecting unspecified side: Secondary | ICD-10-CM

## 2012-06-05 DIAGNOSIS — Z90710 Acquired absence of both cervix and uterus: Secondary | ICD-10-CM | POA: Insufficient documentation

## 2012-06-05 DIAGNOSIS — Z7902 Long term (current) use of antithrombotics/antiplatelets: Secondary | ICD-10-CM | POA: Insufficient documentation

## 2012-06-05 DIAGNOSIS — D509 Iron deficiency anemia, unspecified: Principal | ICD-10-CM | POA: Insufficient documentation

## 2012-06-05 DIAGNOSIS — Z8673 Personal history of transient ischemic attack (TIA), and cerebral infarction without residual deficits: Secondary | ICD-10-CM | POA: Insufficient documentation

## 2012-06-05 DIAGNOSIS — I10 Essential (primary) hypertension: Secondary | ICD-10-CM | POA: Insufficient documentation

## 2012-06-05 DIAGNOSIS — M810 Age-related osteoporosis without current pathological fracture: Secondary | ICD-10-CM | POA: Insufficient documentation

## 2012-06-05 HISTORY — DX: Fracture of unspecified parts of lumbosacral spine and pelvis, initial encounter for closed fracture: S32.9XXA

## 2012-06-05 LAB — CBC
HCT: 29.5 % — ABNORMAL LOW (ref 36.0–46.0)
MCH: 30.3 pg (ref 26.0–34.0)
MCHC: 32.2 g/dL (ref 30.0–36.0)
MCV: 93.9 fL (ref 78.0–100.0)
RDW: 13.1 % (ref 11.5–15.5)

## 2012-06-05 LAB — COMPREHENSIVE METABOLIC PANEL
ALT: 6 U/L (ref 0–35)
Albumin: 3.7 g/dL (ref 3.5–5.2)
Alkaline Phosphatase: 41 U/L (ref 39–117)
Potassium: 4.3 mEq/L (ref 3.5–5.1)
Sodium: 136 mEq/L (ref 135–145)
Total Protein: 6.8 g/dL (ref 6.0–8.3)

## 2012-06-05 LAB — GLUCOSE, CAPILLARY
Glucose-Capillary: 163 mg/dL — ABNORMAL HIGH (ref 70–99)
Glucose-Capillary: 161 mg/dL — ABNORMAL HIGH (ref 70–99)

## 2012-06-05 MED ORDER — HYDROCODONE-ACETAMINOPHEN 5-325 MG PO TABS: 1.0000 | ORAL_TABLET | ORAL | Status: DC | PRN

## 2012-06-05 MED ORDER — ONDANSETRON HCL 4 MG PO TABS: 4.0000 mg | ORAL_TABLET | Freq: Four times a day (QID) | ORAL | Status: DC | PRN

## 2012-06-05 MED ORDER — AMLODIPINE BESYLATE 5 MG PO TABS
5.0000 mg | ORAL_TABLET | Freq: Every day | ORAL | Status: DC
  Administered 2012-06-06: 5 mg via ORAL
  Filled 2012-06-05 (×2): qty 1

## 2012-06-05 MED ORDER — DEXTROSE-NACL 5-0.45 % IV SOLN
INTRAVENOUS | Status: DC
  Administered 2012-06-05: 19:00:00 via INTRAVENOUS

## 2012-06-05 MED ORDER — CITALOPRAM HYDROBROMIDE 10 MG/5ML PO SOLN
10.0000 mg | Freq: Every day | ORAL | Status: DC
  Administered 2012-06-06: 10 mg via ORAL
  Filled 2012-06-05 (×2): qty 10

## 2012-06-05 MED ORDER — ONDANSETRON HCL 4 MG/2ML IJ SOLN: 4.0000 mg | Freq: Four times a day (QID) | INTRAMUSCULAR | Status: DC | PRN

## 2012-06-05 MED ORDER — SODIUM CHLORIDE 0.9 % IV SOLN
INTRAVENOUS | Status: DC
  Administered 2012-06-06: 14:00:00 via INTRAVENOUS

## 2012-06-05 MED ORDER — ACETAMINOPHEN 650 MG RE SUPP: 650.0000 mg | Freq: Four times a day (QID) | RECTAL | Status: DC | PRN

## 2012-06-05 MED ORDER — ACETAMINOPHEN 325 MG PO TABS: 650.0000 mg | ORAL_TABLET | Freq: Four times a day (QID) | ORAL | Status: DC | PRN

## 2012-06-05 MED ORDER — PEG-KCL-NACL-NASULF-NA ASC-C 100 G PO SOLR
1.0000 | Freq: Once | ORAL | Status: AC
  Administered 2012-06-05: 100 g via ORAL
  Filled 2012-06-05: qty 1

## 2012-06-05 MED ORDER — INSULIN ASPART 100 UNIT/ML ~~LOC~~ SOLN: 0.0000 [IU] | Freq: Three times a day (TID) | SUBCUTANEOUS | Status: DC

## 2012-06-05 NOTE — H&P (Signed)
Primary Care Physician:  Rene Paci, MD Primary Gastroenterologist:  Dr. Arlyce Dice  CHIEF COMPLAINT:  anemia  HPI: Patricia Andrews is a 78 y.o. female who was seen recently in the office by Dr. Arlyce Dice. She has history of a CVA and is on chronic Plavix and was referred for evaluation of anemia. Apparently over the past one year her hemoglobin has dropped from the 10.5 range to the 9.5 range. She was started on oral iron recently with labs showing a very mild iron deficiency anemia . She had no GI complaints. Her daughter was interested in pursuing GI evaluation and decision made to proceed with colonoscopy. Patient's Plavix has been held, she felt unable to complete a  prep at home and is admitted at this time to undergo bowel prep and colonoscopy.   Past Medical History:   Stroke                                          1979           Comment:R MCA>>residual spastic L HP; TIA 06/2011   Diabetes mellitus, type 2                                    Hyperlipidemia                                               Hypertension                                                 Arthritis                                                    Depression                                                     Comment:anxiety overlap   Allergic rhinitis                                            Anemia                                                       Osteoporosis, senile                                           Comment:accidental fall w/ pelvic fx 2006  Past Surgical History:   ABDOMINAL HYSTERECTOMY                          1960           Comment:Fibroid tumors  Prior to Admission medications : Medication amLODipine (NORVASC) 5 MG tablet, Sig Take 1 tablet (5 mg total) by mouth daily., Start Date 04/21/12, End Date , Taking? , Authorizing Provider Newt Lukes, MD  Medication citalopram (CELEXA) 10 MG tablet, Sig Take 1 tablet (10 mg total) by mouth daily., Start Date 10/01/11, End Date , Taking?  , Authorizing Provider Newt Lukes, MD  Medication clopidogrel (PLAVIX) 75 MG tablet, Sig Take 1 tablet (75 mg total) by mouth daily., Start Date 12/18/11, End Date , Taking? , Authorizing Provider Newt Lukes, MD  Medication ferrous sulfate 325 (65 FE) MG tablet, Sig Take 325 mg by mouth daily with breakfast., Start Date , End Date , Taking? , Authorizing Provider Historical Provider, MD  Medication glimepiride (AMARYL) 4 MG tablet, Sig Take 1 tablet (4 mg total) by mouth daily before breakfast., Start Date 12/18/11, End Date , Taking? , Authorizing Provider Newt Lukes, MD  Medication ibandronate (BONIVA) 150 MG tablet, Sig Take 1 tablet (150 mg total) by mouth every 30 (thirty) days. Take in the morning with a full glass of water, on an empty stomach, and do not take anything else by mouth or lie down for the next 30 min., Start Date 12/18/11, End Date 12/17/12, Taking? , Authorizing Provider Newt Lukes, MD  Medication metFORMIN (GLUCOPHAGE) 500 MG tablet, Sig Take 1 tablet (500 mg total) by mouth 2 (two) times daily with a meal., Start Date 12/18/11, End Date , Taking? , Authorizing Provider Newt Lukes, MD  Medication olmesartan (BENICAR) 20 MG tablet, Sig Take 1 tablet (20 mg total) by mouth daily., Start Date 12/18/11, End Date , Taking? , Authorizing Provider Newt Lukes, MD  Medication simvastatin (ZOCOR) 20 MG tablet, Sig Take 1 tablet (20 mg total) by mouth at bedtime., Start Date 12/18/11, End Date , Taking? , Authorizing Provider Newt Lukes, MD  Medication zolpidem (AMBIEN) 5 MG tablet, Sig Take 5 mg by mouth at bedtime as needed., Start Date , End Date , Taking? , Authorizing Provider Historical Provider, MD    Current Facility-Administered Medications: 0.9 %  sodium chloride infusion, , Intravenous, Continuous, Louis Meckel, MD acetaminophen (TYLENOL) tablet 650 mg, 650 mg, Oral, Q6H PRN, Jetta Murray S Rolfe Hartsell, PA   Or acetaminophen  (TYLENOL) suppository 650 mg, 650 mg, Rectal, Q6H PRN, Marena Witts S Maxxwell Edgett, PA amLODipine (NORVASC) tablet 5 mg, 5 mg, Oral, Daily, Asmar Brozek S Auriella Wieand, PA citalopram (CELEXA) 10 MG/5ML suspension 10 mg, 10 mg, Oral, Daily, Larin Weissberg S Kariana Wiles, PA dextrose 5 %-0.45 % sodium chloride infusion, , Intravenous, Continuous, Queen Abbett S Tyshan Enderle, PA HYDROcodone-acetaminophen (NORCO/VICODIN) 5-325 MG per tablet 1-2 tablet, 1-2 tablet, Oral, Q4H PRN, Ohana Birdwell S Nayah Lukens, PA ondansetron (ZOFRAN) tablet 4 mg, 4 mg, Oral, Q6H PRN, Shun Pletz S Ronique Simerly, PA   Or ondansetron (ZOFRAN) injection 4 mg, 4 mg, Intravenous, Q6H PRN, Jalilah Wiltsie S Jaiyon Wander, PA peg 3350 powder (MOVIPREP) 100 G kit 100 g, 1 kit, Oral, Once, Amirra Herling S Erich Kochan, PA    Allergies as of 05/27/2012 (No Known Allergies)  - Review Complete 05/27/2012   Review of patient's family history indicates:   Arthritis  Mother                   Arthritis                      Father                   Social History   Marital Status: Single              Spouse Name:                      Years of Education:                 Number of children: 2           Occupational History Occupation          Associate Professor            Comment              RETIRED                                   Social History Main Topics   Smoking Status: Never Smoker                     Smokeless Status: Never Used                       Comment: Single, lives alone in independent assisted             living since 2001. Support by local daughter   Alcohol Use: No             Drug Use: No             Sexual Activity: Not on file        Other Topics            Concern   None on file  Social History Narrative   Lives alone in independent living, single. Never married   Supportive daughter [name] -    Review of Systems: Pertinent positive and negative review of systems were noted in the above HPI section.  All other review of systems was otherwise negative.Marland Kitchen  Physical Exam: Vital  signs in last 24 hours: Temp:  [98.3 F (36.8 C)] 98.3 F (36.8 C) (10/10 1557) Pulse Rate:  [77] 77  (10/10 1557) Resp:  [18] 18  (10/10 1557) BP: (128)/(64) 128/64 mmHg (10/10 1557) SpO2:  [96 %] 96 % (10/10 1557) Weight:  [110 lb 12.8 oz (50.259 kg)] 110 lb 12.8 oz (50.259 kg) (10/10 1557)   General:   Alert,  well-developed, cooperative elderly female in NAD Head:  Normocephalic and atraumatic. Eyes:  Sclera clear, no icterus.   Conjunctiva pink. Ears:  Normal auditory acuity. Mouth:  No deformity or lesions.  Oropharynx pink & moist. Neck:  Supple; no masses or thyromegaly. Lungs:  Clear throughout to auscultation.   No wheezes, crackles, or rhonchi. No acute distress. Heart:  Regular rate and rhythm; no murmurs. Abdomen:  Soft, nontender and nondistended. No masses, hepatomegaly. No obvious masses.  Normal bowel .    Rectal:  Not done Msk:  Symmetrical without gross deformities. Normal posture. Pulses:  Normal pulses noted. Extremities:  Without edema lower extremity brace Neurologic:  Alert and  oriented x4;  grossly normal neurologically. Skin:  Intact without significant lesions or rashes. Cervical Nodes:  No significant cervical adenopathy. Psych:  Alert and cooperative. Normal mood and affect.  Lab Results: No results found for this basename:  WBC:3,HGB:3,HCT:3,PLT:3 in the last 72 hours BMET No results found for this basename:  NA:3,K:3,CL:3,CO2:3,GLUCOSE:3,BUN:3,CREATININE:3,CALCIUM:3 in the last 72 hours LFT No results found for this basename:  PROT,ALBUMIN,AST,ALT,ALKPHOS,BILITOT,BILIDIR,IBILI in the last 72 hours PT/INR No results found for this basename:  LABPROT:2,INR:2 in the last 72 hours Hepatitis Panel No results found for this basename:  HEPBSAG,HCVAB,HEPAIGM,HEPBIGM in the last 72 hours  Studies/Results: No results found.  Impression / Plan: #1 78 yo female with anemia/mild Fe deficiency admitted for bowel prep and colonscopy Pt unable to prep  safely at home secondary to age and prior CVA  #2 chronic anticoagulation -Plavix- on hold since 10/6 #3 AODM-oral agent  Plan; moviprep this evening and colonoscopy 10/11, plan for discharge home tomorrow afternoon.      LOS: 0 days   Cheryl Chay  06/05/2012, 4:47 PM

## 2012-06-06 ENCOUNTER — Encounter (HOSPITAL_COMMUNITY)
Admission: RE | Disposition: A | Discharge status: Home / Self Care | Payer: Self-pay | Source: Ambulatory Visit | Attending: Gastroenterology

## 2012-06-06 ENCOUNTER — Encounter (HOSPITAL_COMMUNITY): Payer: Self-pay

## 2012-06-06 HISTORY — PX: COLONOSCOPY: SHX5424

## 2012-06-06 SURGERY — COLONOSCOPY
Anesthesia: Moderate Sedation

## 2012-06-06 MED ORDER — FENTANYL CITRATE 0.05 MG/ML IJ SOLN
INTRAMUSCULAR | Status: DC | PRN
  Administered 2012-06-06: 12.5 ug via INTRAVENOUS
  Administered 2012-06-06: 25 ug via INTRAVENOUS
  Administered 2012-06-06: 12.5 ug via INTRAVENOUS

## 2012-06-06 MED ORDER — DIPHENHYDRAMINE HCL 50 MG/ML IJ SOLN
INTRAMUSCULAR | Status: AC
  Filled 2012-06-06: qty 1

## 2012-06-06 MED ORDER — MIDAZOLAM HCL 10 MG/2ML IJ SOLN
INTRAMUSCULAR | Status: AC
  Filled 2012-06-06: qty 4

## 2012-06-06 MED ORDER — MIDAZOLAM HCL 5 MG/5ML IJ SOLN
INTRAMUSCULAR | Status: DC | PRN
  Administered 2012-06-06 (×2): 2 mg via INTRAVENOUS

## 2012-06-06 MED ORDER — FENTANYL CITRATE 0.05 MG/ML IJ SOLN
INTRAMUSCULAR | Status: AC
Start: 2012-06-06 — End: 2012-06-06
  Filled 2012-06-06: qty 4

## 2012-06-06 NOTE — Progress Notes (Signed)
Patient was able to complete her bowel prep and has been having watery stools all night. Pt is currently resting comfortably.   MCCLAIN, Patricia Andrews 06/06/2012 1:47 AM

## 2012-06-06 NOTE — Interval H&P Note (Signed)
History and Physical Interval Note:  06/06/2012 2:23 PM  Patricia Andrews  has presented today for surgery, with the diagnosis of Anemia   The various methods of treatment have been discussed with the patient and family. After consideration of risks, benefits and other options for treatment, the patient has consented to  Procedure(s) (LRB) with comments: COLONOSCOPY (N/A) - Rm 1501. Pt to be discharged after procedure. Patient was only admitted for prep. as a surgical intervention .  The patient's history has been reviewed, patient examined, no change in status, stable for surgery.  I have reviewed the patient's chart and labs.  Questions were answered to the patient's satisfaction.    The recent H&P (dated *06/06/12**) was reviewed, the patient was examined and there is no change in the patients condition since that H&P was completed.   Patricia Andrews  06/06/2012, 2:23 PM    Patricia Andrews

## 2012-06-06 NOTE — Discharge Summary (Signed)
[facility] Discharge Summary  Name: Patricia Andrews MRN: [id number] DOB: 11/16/1925 78 y.o. PCP:  Rene Paci, MD  Date of Admission: 06/05/2012  3:37 PM Date of Discharge: 06/06/2012 Attending Physician: Louis Meckel, MD  Discharge Diagnosis: 78 year old female with mild iron deficiency anemia admitted to undergo bowel prep and colonoscopy. Colonoscopy normal. #2 chronic anticoagulation/Plavix #3 history of CVA #4 adult onset diabetes mellitus  Consultations: None Procedures Performed:  No results found.  GI Procedures: Colonoscopy  History/Physical Exam:  See Admission H&P  Admission HPI: Patricia Andrews is a very nice 78 year old African American female who was admitted to the hospital to undergo bowel prep for a planned colonoscopy. Do to her advanced age and history of a CVA family did not feel that she could prep at home. She is on chronic Plavix and this was held prior to admission .  Hospital Course by problem list: Patient was admitted on the evening of 06/05/2012 to undergo bowel prep which she tolerated without difficulty. She has an anemia with hemoglobin of 9.5, this is been stable recently and a mild iron deficiency. She had not undergone prior colonoscopy She had colonoscopy with Dr. Arlyce Dice on 06/06/2012 and this was a normal exam. She tolerated the procedure well and is discharged home this afternoon. She will followup with GI as needed. She will resume her Plavix.  Discharge Vitals:  BP 120/67  Pulse 64  Temp 98.2 F (36.8 C) (Oral)  Resp 13  Ht 5\' 3"  (1.6 m)  Wt 110 lb 12.8 oz (50.259 kg)  BMI 19.63 kg/m2  SpO2 97%  Discharge Labs:  Results for orders placed during the hospital encounter of 06/05/12 (from the past 24 hour(s))  COMPREHENSIVE METABOLIC PANEL     Status: Abnormal   Collection Time   06/05/12  4:57 PM      Component Value Range   Sodium 136  135 - 145 mEq/L   Potassium 4.3  3.5 - 5.1 mEq/L   Chloride 100  96 - 112 mEq/L   CO2  28  19 - 32 mEq/L   Glucose, Bld 180 (*) 70 - 99 mg/dL   BUN 14  6 - 23 mg/dL   Creatinine, Ser 5.62  0.50 - 1.10 mg/dL   Calcium 9.1  8.4 - 13.0 mg/dL   Total Protein 6.8  6.0 - 8.3 g/dL   Albumin 3.7  3.5 - 5.2 g/dL   AST 13  0 - 37 U/L   ALT 6  0 - 35 U/L   Alkaline Phosphatase 41  39 - 117 U/L   Total Bilirubin 0.2 (*) 0.3 - 1.2 mg/dL   GFR calc non Af Amer 49 (*) >90 mL/min   GFR calc Af Amer 57 (*) >90 mL/min  CBC     Status: Abnormal   Collection Time   06/05/12  4:57 PM      Component Value Range   WBC 3.5 (*) 4.0 - 10.5 K/uL   RBC 3.14 (*) 3.87 - 5.11 MIL/uL   Hemoglobin 9.5 (*) 12.0 - 15.0 g/dL   HCT 86.5 (*) 78.4 - 69.6 %   MCV 93.9  78.0 - 100.0 fL   MCH 30.3  26.0 - 34.0 pg   MCHC 32.2  30.0 - 36.0 g/dL   RDW 29.5  28.4 - 13.2 %   Platelets 235  150 - 400 K/uL  PROTIME-INR     Status: Normal   Collection Time   06/05/12  4:57 PM  Component Value Range   Prothrombin Time 12.4  11.6 - 15.2 seconds   INR 0.93  0.00 - 1.49  GLUCOSE, CAPILLARY     Status: Abnormal   Collection Time   06/05/12  5:36 PM      Component Value Range   Glucose-Capillary 163 (*) 70 - 99 mg/dL   Comment 1 Notify RN     Comment 2 Documented in Chart    GLUCOSE, CAPILLARY     Status: Abnormal   Collection Time   06/05/12  9:12 PM      Component Value Range   Glucose-Capillary 161 (*) 70 - 99 mg/dL   Comment 1 Notify RN    GLUCOSE, CAPILLARY     Status: Abnormal   Collection Time   06/06/12  7:25 AM      Component Value Range   Glucose-Capillary 130 (*) 70 - 99 mg/dL   Comment 1 Notify RN    GLUCOSE, CAPILLARY     Status: Abnormal   Collection Time   06/06/12 11:29 AM      Component Value Range   Glucose-Capillary 116 (*) 70 - 99 mg/dL   Comment 1 Notify RN      Disposition and follow-up:   [name] was discharged from [facility] in stable condition.    Follow-up Appointments: Discharge Orders    Future Appointments: Provider: Department: Dept Phone:  Center:   08/12/2012 10:45 AM Newt Lukes, MD [facility] [phone] [facility]      Discharge Medications:   Medication List     As of 06/06/2012  3:11 PM    TAKE these medications         amLODipine 5 MG tablet   Commonly known as: NORVASC   Take 1 tablet (5 mg total) by mouth daily.      bisacodyl 5 MG EC tablet   Commonly known as: DULCOLAX   Take 5 mg by mouth daily as needed. For constipation.      cholecalciferol 1000 UNITS tablet   Commonly known as: VITAMIN D   Take 1,000 Units by mouth daily.      citalopram 10 MG tablet   Commonly known as: CELEXA   Take 1 tablet (10 mg total) by mouth daily.      clopidogrel 75 MG tablet   Commonly known as: PLAVIX   Take 1 tablet (75 mg total) by mouth daily.      ferrous sulfate 325 (65 FE) MG tablet   Take 325 mg by mouth daily with breakfast.      glimepiride 4 MG tablet   Commonly known as: AMARYL   Take 1 tablet (4 mg total) by mouth daily before breakfast.      ibandronate 150 MG tablet   Commonly known as: BONIVA   Take 150 mg by mouth every 30 (thirty) days. Take in the morning with a full glass of water, on an empty stomach, and do not take anything else by mouth or lie down for the next 30 min.  Taken the last Saturday of each month.      metFORMIN 500 MG tablet   Commonly known as: GLUCOPHAGE   Take 1 tablet (500 mg total) by mouth 2 (two) times daily with a meal.      olmesartan 20 MG tablet   Commonly known as: BENICAR   Take 1 tablet (20 mg total) by mouth daily.      simvastatin 20 MG tablet   Commonly known as:  ZOCOR   Take 1 tablet (20 mg total) by mouth at bedtime.      zolpidem 5 MG tablet   Commonly known as: AMBIEN   Take 5 mg by mouth at bedtime as needed. For sleep.        Signed: Mike Gip 06/06/2012, 3:11 PM

## 2012-06-06 NOTE — Op Note (Signed)
[facility] [address] [place], [address]   COLONOSCOPY PROCEDURE REPORT  PATIENT: Patricia, Andrews  MR#: [id number] BIRTHDATE: 03-13-1926 , 86  yrs. old GENDER: Female ENDOSCOPIST: Louis Meckel, MD REFERRED [id number] Felicity Coyer, M.D. PROCEDURE DATE:  06/06/2012 PROCEDURE:   Colonoscopy, diagnostic ASA CLASS:   Class II INDICATIONS:anemia, non-specific. MEDICATIONS: These medications were titrated to patient response per physician's verbal order, Versed 4 mg IV, and Fentanyl 50 mcg IV  DESCRIPTION OF PROCEDURE:   After the risks benefits and alternatives of the procedure were thoroughly explained, informed consent was obtained.  A digital rectal exam revealed no abnormalities of the rectum.   The Pentax Colonoscope [id number] endoscope was introduced through the anus and advanced to the cecum, which was identified by both the appendix and ileocecal valve. No adverse events experienced.   The quality of the prep was good, using MoviPrep  The instrument was then slowly withdrawn as the colon was fully examined.      COLON FINDINGS: A normal appearing cecum, ileocecal valve, and appendiceal orifice were identified.  The ascending, hepatic flexure, transverse, splenic flexure, descending, sigmoid colon and rectum appeared unremarkable.  No polyps or cancers were seen. Retroflexed views revealed no abnormalities. The time to cecum=  . Withdrawal time=5 minutes 0 seconds.  The scope was withdrawn and the procedure completed. COMPLICATIONS: There were no complications.  ENDOSCOPIC IMPRESSION: Normal colon  RECOMMENDATIONS: Call to schedule a follow-up appointment with office PRN   eSigned:  Louis Meckel, MD 06/06/2012 2:59 PM   cc:

## 2012-06-06 NOTE — Progress Notes (Signed)
[facility] Progress Note  Subjective: In good spirits- tolerated prep without difficulty, no sleep last night  Objective:  Vital signs in last 24 hours: Temp:  [98 F (36.7 C)-98.4 F (36.9 C)] 98.4 F (36.9 C) (10/11 0645) Pulse Rate:  [67-88] 88  (10/11 0645) Resp:  [18-20] 20  (10/11 0645) BP: (121-151)/(64-110) 121/110 mmHg (10/11 0645) SpO2:  [96 %-100 %] 99 % (10/11 0645) Weight:  [110 lb 12.8 oz (50.259 kg)] 110 lb 12.8 oz (50.259 kg) (10/10 1557) Last BM Date: 06/05/12 General:   Alert,  Well-developed,    in NAD Heart:  Regular rate and rhythm; no murmurs Pulm;clear Abdomen:  Soft, nontender and nondistended. Normal bowel sounds, without guarding, and without rebound.   Extremities:  Without edema. Neurologic:  Alert and  oriented x4;  grossly normal neurologically. Psych:  Alert and cooperative. Normal mood and affect.  Intake/Output from previous day: 10/10 0701 - 10/11 0700 In: 120 [P.O.:120] Out: 0  Intake/Output this shift:    Lab Results:  Basename 06/05/12 1657  WBC 3.5*  HGB 9.5*  HCT 29.5*  PLT 235   BMET  Basename 06/05/12 1657  NA 136  K 4.3  CL 100  CO2 28  GLUCOSE 180*  BUN 14  CREATININE 1.02  CALCIUM 9.1   LFT  Basename 06/05/12 1657  PROT 6.8  ALBUMIN 3.7  AST 13  ALT 6  ALKPHOS 41  BILITOT 0.2*  BILIDIR --  IBILI --   PT/INR  Basename 06/05/12 1657  LABPROT 12.4  INR 0.93      Assessment / Plan: 78 yo female with anemia- on chronic Plavix post CVA For colonoscopy today , and hopefully home  Active Problems:  * No active hospital problems. *      LOS: 1 day   Amy Esterwood  06/06/2012, 9:33 AM

## 2012-06-09 ENCOUNTER — Encounter (HOSPITAL_COMMUNITY): Payer: Self-pay

## 2012-06-09 ENCOUNTER — Encounter (HOSPITAL_COMMUNITY): Payer: Self-pay | Admitting: Gastroenterology

## 2012-08-06 ENCOUNTER — Encounter: Payer: Self-pay | Admitting: Internal Medicine

## 2012-08-12 ENCOUNTER — Ambulatory Visit (INDEPENDENT_AMBULATORY_CARE_PROVIDER_SITE_OTHER): Payer: Medicare Other | Admitting: Internal Medicine

## 2012-08-12 ENCOUNTER — Other Ambulatory Visit (INDEPENDENT_AMBULATORY_CARE_PROVIDER_SITE_OTHER): Payer: Medicare Other

## 2012-08-12 ENCOUNTER — Encounter: Payer: Self-pay | Admitting: Internal Medicine

## 2012-08-12 VITALS — BP 120/64 | HR 82 | Temp 99.0°F | Ht 63.25 in | Wt 105.6 lb

## 2012-08-12 DIAGNOSIS — E785 Hyperlipidemia, unspecified: Secondary | ICD-10-CM

## 2012-08-12 DIAGNOSIS — F341 Dysthymic disorder: Secondary | ICD-10-CM

## 2012-08-12 DIAGNOSIS — E119 Type 2 diabetes mellitus without complications: Secondary | ICD-10-CM

## 2012-08-12 DIAGNOSIS — Z23 Encounter for immunization: Secondary | ICD-10-CM

## 2012-08-12 DIAGNOSIS — F418 Other specified anxiety disorders: Secondary | ICD-10-CM

## 2012-08-12 DIAGNOSIS — M810 Age-related osteoporosis without current pathological fracture: Secondary | ICD-10-CM

## 2012-08-12 DIAGNOSIS — I10 Essential (primary) hypertension: Secondary | ICD-10-CM

## 2012-08-12 LAB — LIPID PANEL
Cholesterol: 180 mg/dL (ref 0–200)
HDL: 85.1 mg/dL (ref >39.00)
LDL Cholesterol: 86 mg/dL (ref 0–99)
Triglycerides: 45 mg/dL (ref 0.0–149.0)
VLDL: 9 mg/dL (ref 0.0–40.0)

## 2012-08-12 MED ORDER — GLIMEPIRIDE 4 MG PO TABS: 4.0000 mg | ORAL_TABLET | Freq: Every day | ORAL | Status: DC

## 2012-08-12 NOTE — Assessment & Plan Note (Signed)
History of pelvic fracture following accidental fall 2006. On calcium supplement and weekly bisphosphonate since that time. Mild GI upset with actonel and low sugar while empty/waiting changed to Boniva 11/2011 - consider Prolia if Boniva $ prohibitive No bone pain or current complaints Check DEXA now 

## 2012-08-12 NOTE — Patient Instructions (Signed)
It was good to see you today. Medications reviewed and updated- add Remeron for sleep and appetite stimulation - take this in addition to your other medications. Your prescription(s) have been submitted to your pharmacy. Please take as directed and contact our office if you believe you are having problem(s) with the medication(s). we'll make referral for bone density. Our office will contact you regarding appointment(s) once made. Test(s) ordered today. Your results will be released to MyChart (or called to you) after review, usually within 72hours after test completion. If any changes need to be made, you will be notified at that same time. Please schedule followup in 4 months, call sooner if problems.

## 2012-08-12 NOTE — Assessment & Plan Note (Signed)
BP Readings from Last 3 Encounters:  08/12/12 120/64  06/06/12 132/68  06/06/12 132/68   The current medical regimen is effective;  continue present plan and medications.

## 2012-08-12 NOTE — Progress Notes (Signed)
Subjective:    Patient ID: Patricia Andrews, female    DOB: 10/04/1925, 78 y.o.   MRN: [id number]  HPI  Here for follow up - reviewed chronic medical issues today  Hx CVA 1979, residual L HP - recurrent TIA symptoms 06/2011, s/p neuro reeval - medical mgmt ongoing; the patient reports compliance with medication(s) as prescribed. Denies adverse side effects.  DM2 - the patient reports compliance with medication(s) as prescribed. Denies adverse side effects. Denies symptoms or evidence for hypo-or hyperglycemia. No polyuria, polydipsia. Weight is stable  hypertension - the patient reports compliance with medication(s) as prescribed. Denies adverse side effects. No chest pain, headache or vision changes. No edema  Dyslipidemia - on statin - the patient reports compliance with medication(s) as prescribed. Denies adverse side effects.  anemia. Remote B12 shots provided by prior PCP, chronic iron intake. Occassional diarrhea and constipation, blood per rectum or melena. No epigastric pain. No prior colonoscopy. Feels unable to gain weight  Past Medical History  Diagnosis Date  . Stroke 1979    R MCA>>residual spastic L HP; TIA 06/2011  . Diabetes mellitus, type 2   . Hyperlipidemia   . Hypertension   . Arthritis   . Depression     anxiety overlap  . Allergic rhinitis   . Anemia   . Osteoporosis, senile     accidental fall w/ pelvic fx 2006  . Pelvis fracture     Review of Systems  Constitutional: Negative for fever or unexpected weight change.  Respiratory: Negative for cough and shortness of breath.   Cardiovascular: Negative for chest pain or palpitations.       Objective:   Physical Exam  BP 120/64  Pulse 82  Temp 99 F (37.2 C) (Oral)  Ht 5' 3.25" (1.607 m)  Wt 105 lb 9.6 oz (47.9 kg)  BMI 18.56 kg/m2  SpO2 95% Wt Readings from Last 3 Encounters:  08/12/12 105 lb 9.6 oz (47.9 kg)  06/05/12 110 lb 12.8 oz (50.259 kg)  06/05/12 110 lb 12.8 oz (50.259 kg)    Constitutional: She is petite, elderly but spry. appears well-developed and well-nourished. No distress Dtr Hazel at side.  Neck: Normal range of motion. Neck supple. No JVD present. No thyromegaly present.  Cardiovascular: Normal rate, regular rhythm and normal heart sounds.  No murmur heard. No BLE edema. Pulmonary/Chest: Effort normal and breath sounds normal. No respiratory distress. She has no wheezes.  Musculoskeletal: Wears legs/AFO on left lower extremity. Spastic contraction of left upper extremity otherwise normal range of motion, no joint effusions. No gross deformities Neurological: She is alert and oriented to person, place, and time. No cranial nerve deficit. Chronic left hemiparesis affecting upper and lower extremity. Gait independent with assistance of cane. Speech and recall are normal Psychiatric: She has a bright but minimally anxious mood and affect. Occasional tears in eyes when discussing depression symptoms prior to medication treatment. Her behavior is normal. Judgment and thought content normal.   Lab Results  Component Value Date   WBC 3.5* 06/05/2012   HGB 9.5* 06/05/2012   HCT 29.5* 06/05/2012   PLT 235 06/05/2012   GLUCOSE 180* 06/05/2012   CHOL 185 09/20/2011   TRIG 61.0 09/20/2011   HDL 89.50 09/20/2011   LDLCALC 83 09/20/2011   ALT 6 06/05/2012   AST 13 06/05/2012   NA 136 06/05/2012   K 4.3 06/05/2012   CL 100 06/05/2012   CREATININE 1.02 06/05/2012   BUN 14 06/05/2012   CO2 28 06/05/2012  TSH 2.57 04/21/2012   INR 0.93 06/05/2012   HGBA1C 5.8 04/21/2012    Lab Results  Component Value Date   IRON 64 09/20/2011   Lab Results  Component Value Date   VITAMINB12 560 09/20/2011       Assessment & Plan:   see problem list. Medications and labs reviewed today.

## 2012-08-12 NOTE — Assessment & Plan Note (Signed)
New diagnosis fall 2012 following TIA. Reviewed progressive symptoms of social withdrawal and dysphoria prior to initiation of SSRI. Following with psychology services and counseling. Initially much improved with SSRI medication - continue same in treatment as ongoing Also add Remeron qhs for side effects of sedation (to use in place of Ambien if possible) and increase appetite to help weight loss

## 2012-08-12 NOTE — Assessment & Plan Note (Signed)
On statin. No adverse complaints with medication treatment. Check labs now and titrate as needed especially with prior CVA and other co-morbid risk factors

## 2012-08-12 NOTE — Assessment & Plan Note (Signed)
On Amaryl and metformin, and denies symptoms of hypoglycemia. On ARB, statin - refer for optho and foot exam Check A1c now and titrate medications as needed  Lab Results  Component Value Date   HGBA1C 5.8 04/21/2012

## 2012-08-13 ENCOUNTER — Telehealth: Payer: Self-pay | Admitting: *Deleted

## 2012-08-13 MED ORDER — MIRTAZAPINE 15 MG PO TABS: 7.5000 mg | ORAL_TABLET | Freq: Every day | ORAL | Status: DC

## 2012-08-13 NOTE — Telephone Encounter (Signed)
Pt states med for appetite & sleep was not sent to pharmacy...Raechel Chute

## 2012-08-13 NOTE — Telephone Encounter (Signed)
Thanks - erx for remeron now done

## 2012-08-14 NOTE — Telephone Encounter (Signed)
Pt has been aware...Patricia Andrews

## 2012-09-19 ENCOUNTER — Other Ambulatory Visit: Payer: Self-pay | Admitting: *Deleted

## 2012-09-19 MED ORDER — CLOPIDOGREL BISULFATE 75 MG PO TABS: 75.0000 mg | ORAL_TABLET | Freq: Every day | ORAL | Status: DC

## 2012-10-21 ENCOUNTER — Other Ambulatory Visit: Payer: Self-pay | Admitting: Internal Medicine

## 2012-10-25 ENCOUNTER — Other Ambulatory Visit: Payer: Self-pay | Admitting: Internal Medicine

## 2012-11-28 ENCOUNTER — Other Ambulatory Visit: Payer: Self-pay | Admitting: Internal Medicine

## 2012-11-29 ENCOUNTER — Other Ambulatory Visit: Payer: Self-pay | Admitting: Internal Medicine

## 2012-12-04 ENCOUNTER — Encounter: Payer: Self-pay | Admitting: Internal Medicine

## 2012-12-04 ENCOUNTER — Ambulatory Visit (INDEPENDENT_AMBULATORY_CARE_PROVIDER_SITE_OTHER): Payer: Medicare Other | Admitting: Internal Medicine

## 2012-12-04 ENCOUNTER — Other Ambulatory Visit (INDEPENDENT_AMBULATORY_CARE_PROVIDER_SITE_OTHER): Payer: Medicare Other

## 2012-12-04 VITALS — BP 142/74 | HR 84 | Temp 99.1°F | Wt 116.6 lb

## 2012-12-04 DIAGNOSIS — F418 Other specified anxiety disorders: Secondary | ICD-10-CM

## 2012-12-04 DIAGNOSIS — E119 Type 2 diabetes mellitus without complications: Secondary | ICD-10-CM

## 2012-12-04 DIAGNOSIS — F341 Dysthymic disorder: Secondary | ICD-10-CM

## 2012-12-04 DIAGNOSIS — R29818 Other symptoms and signs involving the nervous system: Secondary | ICD-10-CM

## 2012-12-04 DIAGNOSIS — H9190 Unspecified hearing loss, unspecified ear: Secondary | ICD-10-CM

## 2012-12-04 DIAGNOSIS — R2689 Other abnormalities of gait and mobility: Secondary | ICD-10-CM

## 2012-12-04 MED ORDER — GLIMEPIRIDE 2 MG PO TABS: 2.0000 mg | ORAL_TABLET | Freq: Every day | ORAL | Status: DC

## 2012-12-04 MED ORDER — MIRTAZAPINE 15 MG PO TABS: 15.0000 mg | ORAL_TABLET | Freq: Every day | ORAL | Status: DC

## 2012-12-04 MED ORDER — IBANDRONATE SODIUM 150 MG PO TABS: 150.0000 mg | ORAL_TABLET | ORAL | Status: DC

## 2012-12-04 NOTE — Assessment & Plan Note (Signed)
On Amaryl and metformin Note AM hypoglycemia - reduce amaryl now. On ARB, statin - annual eval with optho and foot exam Check A1c now and titrate medications as needed  Lab Results  Component Value Date   HGBA1C 5.5 08/12/2012

## 2012-12-04 NOTE — Assessment & Plan Note (Signed)
New diagnosis late 2012 following TIA. Reviewed progressive symptoms of social withdrawal and dysphoria prior to initiation of SSRI. Following with psychology services and counseling. Initially much improved with SSRI medication - continue same in treatment as ongoing Also added Remeron qhs for side effects of sedation (to use in place of Ambien if possible) and increase appetite to help weight loss -increase dose now

## 2012-12-04 NOTE — Patient Instructions (Signed)
It was good to see you today. Medications reviewed and updated-  Increase dose Remeron to whole pill (15mg ) for sleep and appetite stimulation - take this in addition to your other medications. Decrease dose Amaryl to 2mg  daily Your prescription(s) and refills have been submitted to your pharmacy. Please take as directed and contact our office if you believe you are having problem(s) with the medication(s). we'll make referral for home health therapy. Our office will contact you regarding appointment(s) once made. Test(s) ordered today. Your results will be released to MyChart (or called to you) after review, usually within 72hours after test completion. If any changes need to be made, you will be notified at that same time. Please schedule followup in 4 months, call sooner if problems.

## 2012-12-04 NOTE — Progress Notes (Signed)
Subjective:    Patient ID: Patricia Andrews, female    DOB: 09-Sep-1925, 79 y.o.   MRN: [id number]  HPI  Here for follow up - reviewed chronic medical issues today  Hx CVA 1979, residual L HP - recurrent TIA symptoms 06/2011, s/p neuro reeval - medical mgmt ongoing; the patient reports compliance with medication(s) as prescribed. Denies adverse side effects. Poor balance, fall 09/2012 at home - no injury  DM2 - the patient reports compliance with medication(s) as prescribed. Denies adverse side effects. Denies symptoms or evidence for hyperglycemia. No polyuria, polydipsia. AM cbg 50s  hypertension - the patient reports compliance with medication(s) as prescribed. Denies adverse side effects. No chest pain, headache or vision changes. No edema  Dyslipidemia - on statin - the patient reports compliance with medication(s) as prescribed. Denies adverse side effects.  anemia. Remote B12 shots provided by prior PCP, chronic iron intake. Occassional diarrhea and constipation, blood per rectum or melena. No epigastric pain. No prior colonoscopy. Feels unable to gain weight  Past Medical History  Diagnosis Date  . Stroke 1979    R MCA>>residual spastic L HP; TIA 06/2011  . Diabetes mellitus, type 2   . Hyperlipidemia   . Hypertension   . Arthritis   . Depression     anxiety overlap  . Allergic rhinitis   . Anemia   . Osteoporosis, senile     accidental fall w/ pelvic fx 2006  . Pelvis fracture     Review of Systems  Constitutional: Negative for fever or unexpected weight change.  ENT: decreased hearing Respiratory: Negative for cough and shortness of breath.   Cardiovascular: Negative for chest pain or palpitations.       Objective:   Physical Exam  BP 142/74  Pulse 84  Temp(Src) 99.1 F (37.3 C) (Oral)  Wt 116 lb 9.6 oz (52.889 kg)  BMI 20.48 kg/m2  SpO2 98% Wt Readings from Last 3 Encounters:  12/04/12 116 lb 9.6 oz (52.889 kg)  08/12/12 105 lb 9.6 oz (47.9 kg)  06/05/12  110 lb 12.8 oz (50.259 kg)   Constitutional: She is petite, elderly but spry. appears well-developed and well-nourished. No distress Dtr Hazel at side.  Neck: Normal range of motion. Neck supple. No JVD present. No thyromegaly present.  Cardiovascular: Normal rate, regular rhythm and normal heart sounds.  No murmur heard. No BLE edema. Pulmonary/Chest: Effort normal and breath sounds normal. No respiratory distress. She has no wheezes.  Musculoskeletal: Wears legs/AFO on left lower extremity. Spastic contraction of left upper extremity otherwise normal range of motion, no joint effusions. No gross deformities Neurological: She is alert and oriented to person, place, and time. No cranial nerve deficit. Chronic left hemiparesis affecting upper and lower extremity. Gait independent with assistance of cane. Speech and recall are normal Psychiatric: She has a bright but minimally anxious mood and affect. Occasional tears in eyes when discussing depression symptoms prior to medication treatment. Her behavior is normal. Judgment and thought content normal.   Lab Results  Component Value Date   WBC 3.5* 06/05/2012   HGB 9.5* 06/05/2012   HCT 29.5* 06/05/2012   PLT 235 06/05/2012   GLUCOSE 180* 06/05/2012   CHOL 180 08/12/2012   TRIG 45.0 08/12/2012   HDL 85.10 08/12/2012   LDLCALC 86 08/12/2012   ALT 6 06/05/2012   AST 13 06/05/2012   NA 136 06/05/2012   K 4.3 06/05/2012   CL 100 06/05/2012   CREATININE 1.02 06/05/2012   BUN 14 06/05/2012  CO2 28 06/05/2012   TSH 2.57 04/21/2012   INR 0.93 06/05/2012   HGBA1C 5.5 08/12/2012    Lab Results  Component Value Date   IRON 64 09/20/2011   Lab Results  Component Value Date   VITAMINB12 560 09/20/2011       Assessment & Plan:   see problem list. Medications and labs reviewed today.  Fall 09/2012 - accidental at home - chronic balance disorder, exac by CVA deficit - refer for [facility] Pt/Ot and safety eval  Decreased hearing - refer to  audiology

## 2012-12-11 ENCOUNTER — Ambulatory Visit: Payer: Self-pay | Admitting: Internal Medicine

## 2012-12-17 ENCOUNTER — Telehealth: Payer: Self-pay

## 2012-12-17 NOTE — Telephone Encounter (Signed)
Concerns noted. No reason to continue physical therapy. I respectfully disagree with the physical therapist opinion about potential medication interaction. It is safe to continue amlodipine with simvastatin at the currently prescribed doses. No changes recommended.Thanks

## 2012-12-17 NOTE — Telephone Encounter (Signed)
Physical therapist called to inform MD that she has evaluated pt and does not feel pt has a need for PT but does recommends home exercise program. PT also wanted to inform MD that there was a severe drug interaction between Norvasc and Simvastatin. Please advise on drug interaction.

## 2012-12-18 NOTE — Telephone Encounter (Signed)
Physical therapist advised

## 2012-12-23 DIAGNOSIS — R269 Unspecified abnormalities of gait and mobility: Secondary | ICD-10-CM

## 2013-01-06 ENCOUNTER — Encounter: Payer: Self-pay | Admitting: Internal Medicine

## 2013-02-03 ENCOUNTER — Ambulatory Visit: Payer: Self-pay | Admitting: Podiatry

## 2013-02-04 ENCOUNTER — Ambulatory Visit (INDEPENDENT_AMBULATORY_CARE_PROVIDER_SITE_OTHER): Payer: Medicare Other | Admitting: Podiatry

## 2013-02-04 VITALS — BP 138/74 | HR 78

## 2013-02-04 DIAGNOSIS — B351 Tinea unguium: Secondary | ICD-10-CM | POA: Insufficient documentation

## 2013-02-04 DIAGNOSIS — M79609 Pain in unspecified limb: Secondary | ICD-10-CM

## 2013-02-04 NOTE — Progress Notes (Signed)
Subjective: 79 y.o. year old Type II NIDDM, female patient presents complaining of painful left great toe from thick and long nails.  Patient had a stroke and has weak left arm and leg. Wearing a AFO leg brace on left.  Objective: Dermatologic: Thick yellow deformed nails x 10. Vascular: Pedal pulses are weak but all palpable. Orthopedic: No gross deformities. Left foot has foot drop and flaccid.  Neurologic: All epicritic and tactile sensations grossly intact.  Assessment: Dystrophic mycotic nails x 10. Pain on left great toe with thick dystrophic nail.  Treatment: All mycotic nails debrided.  Return in 3 months or as needed.

## 2013-03-19 ENCOUNTER — Other Ambulatory Visit: Payer: Self-pay | Admitting: *Deleted

## 2013-03-19 MED ORDER — AMLODIPINE BESYLATE 5 MG PO TABS: ORAL_TABLET | ORAL | Status: DC

## 2013-03-19 NOTE — Telephone Encounter (Signed)
Called pharmacy spoke with Olegario Messier gave approval. Epic updated...lmb

## 2013-04-16 ENCOUNTER — Other Ambulatory Visit: Payer: Self-pay | Admitting: *Deleted

## 2013-04-16 MED ORDER — AMLODIPINE BESYLATE 5 MG PO TABS: ORAL_TABLET | ORAL | Status: DC

## 2013-04-16 MED ORDER — OLMESARTAN MEDOXOMIL 20 MG PO TABS: ORAL_TABLET | ORAL | Status: DC

## 2013-04-20 ENCOUNTER — Other Ambulatory Visit: Payer: Self-pay | Admitting: *Deleted

## 2013-04-20 NOTE — Telephone Encounter (Signed)
A user error has taken place.. Open by mistake.../lmb 

## 2013-04-21 ENCOUNTER — Ambulatory Visit: Payer: Self-pay | Admitting: Internal Medicine

## 2013-04-21 ENCOUNTER — Ambulatory Visit (INDEPENDENT_AMBULATORY_CARE_PROVIDER_SITE_OTHER): Payer: Medicare Other | Admitting: Internal Medicine

## 2013-04-21 ENCOUNTER — Other Ambulatory Visit (INDEPENDENT_AMBULATORY_CARE_PROVIDER_SITE_OTHER): Payer: Medicare Other

## 2013-04-21 ENCOUNTER — Encounter: Payer: Self-pay | Admitting: Internal Medicine

## 2013-04-21 VITALS — BP 144/78 | HR 91 | Temp 99.2°F | Wt 123.2 lb

## 2013-04-21 DIAGNOSIS — E785 Hyperlipidemia, unspecified: Secondary | ICD-10-CM

## 2013-04-21 DIAGNOSIS — D649 Anemia, unspecified: Secondary | ICD-10-CM

## 2013-04-21 DIAGNOSIS — Z23 Encounter for immunization: Secondary | ICD-10-CM

## 2013-04-21 DIAGNOSIS — E119 Type 2 diabetes mellitus without complications: Secondary | ICD-10-CM

## 2013-04-21 DIAGNOSIS — I10 Essential (primary) hypertension: Secondary | ICD-10-CM

## 2013-04-21 LAB — CBC WITH DIFFERENTIAL/PLATELET
Basophils Relative: 0.8 % (ref 0.0–3.0)
Eosinophils Relative: 4.8 % (ref 0.0–5.0)
Lymphocytes Relative: 28.4 % (ref 12.0–46.0)
MCV: 93.4 fl (ref 78.0–100.0)
Monocytes Absolute: 0.4 10*3/uL (ref 0.1–1.0)
Monocytes Relative: 8.1 % (ref 3.0–12.0)
Neutrophils Relative %: 57.9 % (ref 43.0–77.0)
Platelets: 217 10*3/uL (ref 150.0–400.0)
RBC: 3.35 Mil/uL — ABNORMAL LOW (ref 3.87–5.11)
WBC: 4.4 10*3/uL — ABNORMAL LOW (ref 4.5–10.5)

## 2013-04-21 LAB — LIPID PANEL
Cholesterol: 217 mg/dL — ABNORMAL HIGH (ref 0–200)
Total CHOL/HDL Ratio: 2
Triglycerides: 83 mg/dL (ref 0.0–149.0)
VLDL: 16.6 mg/dL (ref 0.0–40.0)

## 2013-04-21 LAB — BASIC METABOLIC PANEL
BUN: 20 mg/dL (ref 6–23)
Calcium: 9.4 mg/dL (ref 8.4–10.5)
Chloride: 104 mEq/L (ref 96–112)
Creatinine, Ser: 1.4 mg/dL — ABNORMAL HIGH (ref 0.4–1.2)

## 2013-04-21 LAB — LDL CHOLESTEROL, DIRECT: Direct LDL: 108.6 mg/dL

## 2013-04-21 LAB — HEMOGLOBIN A1C: Hgb A1c MFr Bld: 5.8 % (ref 4.6–6.5)

## 2013-04-21 MED ORDER — MIRTAZAPINE 15 MG PO TABS: 15.0000 mg | ORAL_TABLET | Freq: Every day | ORAL | Status: DC

## 2013-04-21 NOTE — Patient Instructions (Signed)
It was good to see you today. Health Maintenance reviewed - Tetanus booster updated today - all other recommended immunizations and age-appropriate screenings are up-to-date. Medications reviewed and updated, no changes recommended at this time. Test(s) ordered today. Your results will be released to MyChart (or called to you) after review, usually within 72hours after test completion. If any changes need to be made, you will be notified at that same time. Please schedule followup in 4 months for diabetes mellitus check, call sooner if problems.

## 2013-04-21 NOTE — Assessment & Plan Note (Signed)
BP Readings from Last 3 Encounters:  04/21/13 144/78  02/04/13 138/74  12/04/12 142/74   The current medical regimen is effective;  continue present plan and medications.

## 2013-04-21 NOTE — Assessment & Plan Note (Signed)
On Amaryl and metformin Previously AM hypoglycemia - reduced amaryl 11/2012. On ARB, statin - annual eval with optho and foot exam Check A1c now and titrate medications as needed  Lab Results  Component Value Date   HGBA1C 6.0 12/04/2012

## 2013-04-21 NOTE — Progress Notes (Signed)
  Subjective:    Patient ID: Patricia Andrews, female    DOB: [birth year], 79 y.o.   MRN: [id number]  HPI Here for follow up - reviewed chronic medical issues today   Past Medical History  Diagnosis Date  . Stroke 1979    R MCA>>residual spastic L HP; TIA 06/2011  . Diabetes mellitus, type 2   . Hyperlipidemia   . Hypertension   . Arthritis   . Depression     anxiety overlap  . Allergic rhinitis   . Anemia   . Osteoporosis, senile     accidental fall w/ pelvic fx 2006  . Pelvis fracture     Review of Systems Constitutional: Negative for fever or unexpected weight change.  ENT: decreased hearing, chronic Respiratory: Negative for cough and shortness of breath.   Cardiovascular: Negative for chest pain or palpitations.       Objective:   Physical Exam BP 144/78  Pulse 91  Temp(Src) 99.2 F (37.3 C) (Oral)  Wt 123 lb 3.2 oz (55.883 kg)  BMI 21.64 kg/m2  SpO2 98% Wt Readings from Last 3 Encounters:  04/21/13 123 lb 3.2 oz (55.883 kg)  12/04/12 116 lb 9.6 oz (52.889 kg)  08/12/12 105 lb 9.6 oz (47.9 kg)   Constitutional: She is petite, elderly but spry. appears well-developed and well-nourished. No distress Dtr Hazel at side.  Neck: Normal range of motion. Neck supple. No JVD present. No thyromegaly present.  Cardiovascular: Normal rate, regular rhythm and normal heart sounds.  No murmur heard. No BLE edema. Pulmonary/Chest: Effort normal and breath sounds normal. No respiratory distress. She has no wheezes.  Musculoskeletal: Wears legs/AFO on left lower extremity. Spastic contraction of left upper extremity otherwise normal range of motion, no joint effusions. No gross deformities Neurological: She is alert and oriented to person, place, and time. No cranial nerve deficit. Chronic left hemiparesis affecting upper and lower extremity. Gait independent with assistance of cane. Speech and recall are normal Psychiatric: She has a bright, minimally anxious mood and affect. Her  behavior is normal. Judgment and thought content normal.   Lab Results  Component Value Date   WBC 3.5* 06/05/2012   HGB 9.5* 06/05/2012   HCT 29.5* 06/05/2012   PLT 235 06/05/2012   GLUCOSE 180* 06/05/2012   CHOL 180 08/12/2012   TRIG 45.0 08/12/2012   HDL 85.10 08/12/2012   LDLCALC 86 08/12/2012   ALT 6 06/05/2012   AST 13 06/05/2012   NA 136 06/05/2012   K 4.3 06/05/2012   CL 100 06/05/2012   CREATININE 1.02 06/05/2012   BUN 14 06/05/2012   CO2 28 06/05/2012   TSH 2.57 04/21/2012   INR 0.93 06/05/2012   HGBA1C 6.0 12/04/2012    Lab Results  Component Value Date   IRON 64 09/20/2011   Lab Results  Component Value Date   VITAMINB12 560 09/20/2011       Assessment & Plan:   see problem list. Medications and labs reviewed today.

## 2013-04-21 NOTE — Assessment & Plan Note (Signed)
Chronic iron def - On oral iron Recheck CBC now s/p GI evaluation 05/2012 with Arlyce Dice - unremarkable No history or exam evidence for acute GI bleed

## 2013-04-21 NOTE — Assessment & Plan Note (Signed)
On statin. No adverse complaints with medication treatment. Check labs now and titrate as needed especially with prior CVA and other co-morbid risk factors

## 2013-05-12 ENCOUNTER — Ambulatory Visit (INDEPENDENT_AMBULATORY_CARE_PROVIDER_SITE_OTHER): Payer: Medicare Other | Admitting: Podiatry

## 2013-05-12 ENCOUNTER — Encounter: Payer: Self-pay | Admitting: Podiatry

## 2013-05-12 VITALS — BP 126/80 | HR 80 | Ht 62.0 in | Wt 125.0 lb

## 2013-05-12 DIAGNOSIS — B351 Tinea unguium: Secondary | ICD-10-CM

## 2013-05-12 DIAGNOSIS — I69959 Hemiplegia and hemiparesis following unspecified cerebrovascular disease affecting unspecified side: Secondary | ICD-10-CM

## 2013-05-12 DIAGNOSIS — I69359 Hemiplegia and hemiparesis following cerebral infarction affecting unspecified side: Secondary | ICD-10-CM

## 2013-05-12 DIAGNOSIS — M79609 Pain in unspecified limb: Secondary | ICD-10-CM

## 2013-05-12 NOTE — Progress Notes (Signed)
Patient wants toe nails trimmed.  Weak on left side arm and leg from post stroke. Wears AFO on left foot and leg, and ambulating well using a cane.  Hypertrophic nails x 10. Corn on 5th right. Plantar callus on 2nd MPJ right. Left foot pedal pulses are not palpable, right side is normal with palpable pedal pulses.  All nails, corns and calluses debrided. Return in 3 months or as needed.

## 2013-05-12 NOTE — Patient Instructions (Addendum)
Seen for toe nails and calluses.  All nails, corns and calluses debrided. Return in 3 months or as needed.

## 2013-06-08 ENCOUNTER — Other Ambulatory Visit: Payer: Self-pay | Admitting: Internal Medicine

## 2013-06-22 ENCOUNTER — Other Ambulatory Visit: Payer: Self-pay | Admitting: Internal Medicine

## 2013-06-24 ENCOUNTER — Other Ambulatory Visit: Payer: Self-pay | Admitting: Internal Medicine

## 2013-07-28 LAB — HM MAMMOGRAPHY

## 2013-07-30 ENCOUNTER — Encounter: Payer: Self-pay | Admitting: Internal Medicine

## 2013-08-10 ENCOUNTER — Ambulatory Visit (INDEPENDENT_AMBULATORY_CARE_PROVIDER_SITE_OTHER): Payer: Medicare Other | Admitting: Podiatry

## 2013-08-10 ENCOUNTER — Encounter: Payer: Self-pay | Admitting: Podiatry

## 2013-08-10 VITALS — BP 145/96 | HR 70 | Ht 62.0 in | Wt 125.0 lb

## 2013-08-10 DIAGNOSIS — B351 Tinea unguium: Secondary | ICD-10-CM

## 2013-08-10 DIAGNOSIS — M79609 Pain in unspecified limb: Secondary | ICD-10-CM

## 2013-08-10 NOTE — Patient Instructions (Signed)
Seen for hypertrophic nails and calluses. All debrided. Return in 3 months.

## 2013-08-10 NOTE — Progress Notes (Signed)
Subjective:  79 year old female presents requesting toe nails and calluses trimmed.  She has weak on left arm and leg from post stroke. Wears AFO on left foot and leg, and ambulating well using a cane.   Objective: Hypertrophic nails x 10.  Corn on 5th right.  Plantar callus on 2nd MPJ right.  Left foot pedal pulses are not palpable, right side is normal with palpable pedal pulses.   Assessment: Dystrophic nails x 10. Painful feet with corns and toe nails.  Status post stroke with paralysis on left side arm and leg.  Plan: All nails, corns and calluses debrided.  Return in 3 months or as needed.

## 2013-08-11 ENCOUNTER — Ambulatory Visit: Payer: Medicare Other | Admitting: Podiatry

## 2013-09-08 ENCOUNTER — Ambulatory Visit (INDEPENDENT_AMBULATORY_CARE_PROVIDER_SITE_OTHER): Payer: Medicare Other | Admitting: Internal Medicine

## 2013-09-08 ENCOUNTER — Encounter: Payer: Self-pay | Admitting: Internal Medicine

## 2013-09-08 ENCOUNTER — Other Ambulatory Visit (INDEPENDENT_AMBULATORY_CARE_PROVIDER_SITE_OTHER): Payer: Medicare Other

## 2013-09-08 VITALS — BP 140/78 | HR 101 | Temp 98.9°F | Ht 62.0 in | Wt 127.8 lb

## 2013-09-08 DIAGNOSIS — Z23 Encounter for immunization: Secondary | ICD-10-CM

## 2013-09-08 DIAGNOSIS — I10 Essential (primary) hypertension: Secondary | ICD-10-CM

## 2013-09-08 DIAGNOSIS — M810 Age-related osteoporosis without current pathological fracture: Secondary | ICD-10-CM

## 2013-09-08 DIAGNOSIS — E119 Type 2 diabetes mellitus without complications: Secondary | ICD-10-CM

## 2013-09-08 DIAGNOSIS — Z0000 Encounter for general adult medical examination without abnormal findings: Secondary | ICD-10-CM

## 2013-09-08 DIAGNOSIS — E785 Hyperlipidemia, unspecified: Secondary | ICD-10-CM

## 2013-09-08 LAB — BASIC METABOLIC PANEL
BUN: 16 mg/dL (ref 6–23)
CO2: 26 meq/L (ref 19–32)
Calcium: 9.4 mg/dL (ref 8.4–10.5)
Chloride: 103 mEq/L (ref 96–112)
Creatinine, Ser: 1.3 mg/dL — ABNORMAL HIGH (ref 0.4–1.2)
GFR: 52.3 mL/min — ABNORMAL LOW (ref >60.00)
Glucose, Bld: 113 mg/dL — ABNORMAL HIGH (ref 70–99)
Potassium: 5 mEq/L (ref 3.5–5.1)
SODIUM: 137 meq/L (ref 135–145)

## 2013-09-08 LAB — HEMOGLOBIN A1C: Hgb A1c MFr Bld: 6.1 % (ref 4.6–6.5)

## 2013-09-08 NOTE — Assessment & Plan Note (Signed)
History of pelvic fracture following accidental fall 2006. On calcium supplement and weekly bisphosphonate since that time. Mild GI upset with actonel and low sugar while empty/waiting changed to Boniva 11/2011 - consider Prolia if Boniva $ prohibitive No bone pain or current complaints Check DEXA now

## 2013-09-08 NOTE — Progress Notes (Signed)
Subjective:    Patient ID: Patricia Andrews, female    DOB: 1926-03-20, 80 y.o.   MRN: [id number]  HPI   Here for medicare wellness  Diet: heart healthy or DM if diabetic Physical activity: sedentary Depression/mood screen: negative Hearing: intact to whispered voice Visual acuity: grossly normal, performs annual eye exam  ADLs: capable Fall risk: none Home safety: good Cognitive evaluation: intact to orientation, naming, recall and repetition EOL planning: adv directives, full code/ I agree  I have personally reviewed and have noted 1. The patient's medical and social history 2. Their use of alcohol, tobacco or illicit drugs 3. Their current medications and supplements 4. The patient's functional ability including ADL's, fall risks, home safety risks and hearing or visual impairment. 5. Diet and physical activities 6. Evidence for depression or mood disorders  also reviewed chronic medical issues today   Past Medical History  Diagnosis Date  . Stroke 1979    R MCA>>residual spastic L HP; TIA 06/2011  . Diabetes mellitus, type 2   . Hyperlipidemia   . Hypertension   . Arthritis   . Depression     anxiety overlap  . Allergic rhinitis   . Anemia   . Osteoporosis, senile     accidental fall w/ pelvic fx 2006  . Pelvis fracture    Family History  Problem Relation Age of Onset  . Arthritis Mother   . Arthritis Father    History  Substance Use Topics  . Smoking status: Never Smoker   . Smokeless tobacco: Never Used     Comment: Single, lives alone in independent assisted living since 2001. Support by local daughter  . Alcohol Use: No     Review of Systems  Constitutional: Negative for fatigue and unexpected weight change.  Respiratory: Negative for cough, shortness of breath and wheezing.   Cardiovascular: Negative for chest pain, palpitations and leg swelling.  Gastrointestinal: Negative for nausea, abdominal pain and diarrhea.  Neurological: Negative for  dizziness, weakness, light-headedness and headaches.  Psychiatric/Behavioral: Negative for dysphoric mood. The patient is not nervous/anxious.   All other systems reviewed and are negative.         Objective:   Physical Exam BP 140/78  Pulse 101  Temp(Src) 98.9 F (37.2 C) (Oral)  Ht 5\' 2"  (1.575 m)  Wt 127 lb 12.8 oz (57.97 kg)  BMI 23.37 kg/m2  SpO2 97% Wt Readings from Last 3 Encounters:  09/08/13 127 lb 12.8 oz (57.97 kg)  08/10/13 125 lb (56.7 kg)  05/12/13 125 lb (56.7 kg)   Constitutional: She is petite, elderly but spry. appears well-developed and well-nourished. No distress Dtr Hazel at side.  Neck: Normal range of motion. Neck supple. No JVD present. No thyromegaly present.  Cardiovascular: Normal rate, regular rhythm and normal heart sounds.  No murmur heard. No BLE edema. Pulmonary/Chest: Effort normal and breath sounds normal. No respiratory distress. She has no wheezes.  Musculoskeletal: Wears legs/AFO on left lower extremity. Spastic contraction of left upper extremity otherwise normal range of motion, no joint effusions. No gross deformities Neurological: She is alert and oriented to person, place, and time. No cranial nerve deficit. Chronic left hemiparesis affecting upper and lower extremity. Gait independent with assistance of cane. Speech and recall are normal Psychiatric: She has a bright, minimally anxious mood and affect. Her behavior is normal. Judgment and thought content normal.   Lab Results  Component Value Date   WBC 4.4* 04/21/2013   HGB 10.4* 04/21/2013   HCT 31.3*  04/21/2013   PLT 217.0 04/21/2013   GLUCOSE 108* 04/21/2013   CHOL 217* 04/21/2013   TRIG 83.0 04/21/2013   HDL 95.00 04/21/2013   LDLDIRECT 108.6 04/21/2013   LDLCALC 86 08/12/2012   ALT 6 06/05/2012   AST 13 06/05/2012   NA 134* 04/21/2013   K 5.4* 04/21/2013   CL 104 04/21/2013   CREATININE 1.4* 04/21/2013   BUN 20 04/21/2013   CO2 26 04/21/2013   TSH 2.57 04/21/2012   INR 0.93  06/05/2012   HGBA1C 5.8 04/21/2013    Lab Results  Component Value Date   IRON 64 09/20/2011   Lab Results  Component Value Date   VITAMINB12 560 09/20/2011       Assessment & Plan:   AWV/v70.0 - Today patient counseled on age appropriate routine health concerns for screening and prevention, each reviewed and up to date or declined. Immunizations reviewed and up to date or declined. Labs ordered and reviewed. Risk factors for depression reviewed and negative. Hearing function and visual acuity are intact. ADLs screened and addressed as needed. Functional ability and level of safety reviewed and appropriate. Education, counseling and referrals performed based on assessed risks today. Patient provided with a copy of personalized plan for preventive services.  Also see problem list. Medications and labs reviewed today.

## 2013-09-08 NOTE — Progress Notes (Signed)
Pre-visit discussion using our clinic review tool. No additional management support is needed unless otherwise documented below in the visit note.  

## 2013-09-08 NOTE — Assessment & Plan Note (Signed)
BP Readings from Last 3 Encounters:  09/08/13 140/78  08/10/13 145/96  05/12/13 126/80   The current medical regimen is effective;  continue present plan and medications.

## 2013-09-08 NOTE — Assessment & Plan Note (Signed)
On Amaryl and metformin Previously AM hypoglycemia - reduced amaryl 11/2012. On ARB, statin - annual eval with optho and foot exam Check A1c now and titrate medications as needed  Lab Results  Component Value Date   HGBA1C 5.8 04/21/2013

## 2013-09-08 NOTE — Assessment & Plan Note (Signed)
On statin. No adverse complaints with medication treatment. Check labs annually and titrate as needed especially with prior CVA and other co-morbid risk factors

## 2013-09-08 NOTE — Patient Instructions (Addendum)
It was good to see you today.  Your annual flu shot was given and/or updated today.  Test(s) ordered today. Your results will be released to Patricia Andrews (or called to you) after review, usually within 72hours after test completion. If any changes need to be made, you will be notified at that same time.  Scheduled DEXA bone density to evaluate osteoporosis and consider treatment as needed  Medications reviewed and updated, no changes recommended at this time.  Please schedule followup in 6 months, call sooner if problems.  Diabetes and Standards of Medical Care  Diabetes is complicated. You may find that your diabetes team includes a dietitian, nurse, diabetes educator, eye doctor, and more. To help everyone know what is going on and to help you get the care you deserve, the following schedule of care was developed to help keep you on track. Below are the tests, exams, vaccines, medicines, education, and plans you will need. HbA1c test This test shows how well you have controlled your glucose over the past 2 3 months. It is used to see if your diabetes management plan needs to be adjusted.   It is performed at least 2 times a year if you are meeting treatment goals.  It is performed 4 times a year if therapy has changed or if you are not meeting treatment goals. Blood pressure test  This test is performed at every routine medical visit. The goal is less than 140/90 mmHg for most people, but 130/80 mmHg in some cases. Ask your health care provider about your goal. Dental exam  Follow up with the dentist regularly. Eye exam  If you are diagnosed with type 1 diabetes as a child, get an exam upon reaching the age of 50 years or older and have had diabetes for 3 5 years. Yearly eye exams are recommended after that initial eye exam.  If you are diagnosed with type 1 diabetes as an adult, get an exam within 5 years of diagnosis and then yearly.  If you are diagnosed with type 2 diabetes, get an  exam as soon as possible after the diagnosis and then yearly. Foot care exam  Visual foot exams are performed at every routine medical visit. The exams check for cuts, injuries, or other problems with the feet.  A comprehensive foot exam should be done yearly. This includes visual inspection as well as assessing foot pulses and testing for loss of sensation.  Check your feet nightly for cuts, injuries, or other problems with your feet. Tell your health care provider if anything is not healing. Kidney function test (urine microalbumin)  This test is performed once a year.  Type 1 diabetes: The first test is performed 5 years after diagnosis.  Type 2 diabetes: The first test is performed at the time of diagnosis.  A serum creatinine and estimated glomerular filtration rate (eGFR) test is done once a year to assess the level of chronic kidney disease (CKD), if present. Lipid profile (cholesterol, HDL, LDL, triglycerides)  Performed every 5 years for most people.  The goal for LDL is less than 100 mg/dL. If you are at high risk, the goal is less than 70 mg/dL.  The goal for HDL is 40 mg/dL 50 mg/dL for men and 50 mg/dL 60 mg/dL for women. An HDL cholesterol of 60 mg/dL or higher gives some protection against heart disease.  The goal for triglycerides is less than 150 mg/dL. Influenza vaccine, pneumococcal vaccine, and hepatitis B vaccine  The influenza vaccine  is recommended yearly.  The pneumococcal vaccine is generally given once in a lifetime. However, there are some instances when another vaccination is recommended. Check with your health care provider.  The hepatitis B vaccine is also recommended for adults with diabetes. Diabetes self-management education  Education is recommended at diagnosis and ongoing as needed. Treatment plan  Your treatment plan is reviewed at every medical visit. Document Released: 06/10/2009 Document Revised: 04/15/2013 Document Reviewed:  01/13/2013 [facility] Patient Information 2014 Patricia Andrews.

## 2013-09-08 NOTE — Progress Notes (Signed)
Subjective:    Patient ID: Patricia Andrews, female    DOB: 1926/05/23, [age] y.o.   MRN: [id number] Chief Complaint  Patient presents with  . Follow-up    4 months  . Annual Exam    HPI Patient is here for 6 month follow up ,  We reviewed chronic medical issues and medical concerns. No new medical complaints.     Past Medical History  Diagnosis Date  . Stroke 1979    R MCA>>residual spastic L HP; TIA 06/2011  . Diabetes mellitus, type 2   . Hyperlipidemia   . Hypertension   . Arthritis   . Depression     anxiety overlap  . Allergic rhinitis   . Anemia   . Osteoporosis, senile     accidental fall w/ pelvic fx 2006  . Pelvis fracture    Past Surgical History  Procedure Laterality Date  . Abdominal hysterectomy  1960    Fibroid tumors  . Colonoscopy  06/06/2012    Procedure: COLONOSCOPY;  Surgeon: Louis Meckelobert D Kaplan, MD;  Location: [facility];  Service: Endoscopy;  Laterality: N/A;  Rm 1501. Pt to be discharged after procedure. Patient was only admitted for prep.   History   Social History  . Marital Status: Single    Spouse Name: N/A    Number of Children: 2  . Years of Education: N/A   Occupational History  . RETIRED    Social History Main Topics  . Smoking status: Never Smoker   . Smokeless tobacco: Never Used     Comment: Single, lives alone in independent assisted living since 2001. Support by local daughter  . Alcohol Use: No  . Drug Use: No  . Sexual Activity: Not on file   Other Topics Concern  . Not on file   Social History Narrative   Lives alone in independent living, single. Never married   Supportive daughter Jerrye BeaversHazel -   Family History  Problem Relation Age of Onset  . Arthritis Mother   . Arthritis Father    Current Outpatient Prescriptions on File Prior to Visit  Medication Sig Dispense Refill  . amLODipine (NORVASC) 5 MG tablet TAKE 1 TABLET BY MOUTH DAILY .  30 tablet  5  . bisacodyl (DULCOLAX) 5 MG EC tablet Take 5 mg by mouth daily as  needed. For constipation.      . chlorhexidine (PERIDEX) 0.12 % solution       . cholecalciferol (VITAMIN D) 1000 UNITS tablet Take 1,000 Units by mouth daily.      . citalopram (CELEXA) 20 MG tablet Take 20 mg by mouth daily.      . clopidogrel (PLAVIX) 75 MG tablet TAKE 1 TABLET BY MOUTH DAILY .  30 tablet  0  . ferrous sulfate 325 (65 FE) MG tablet Take 325 mg by mouth daily with breakfast.      . glimepiride (AMARYL) 2 MG tablet TAKE 1 TABLET BY MOUTH EVERY DAY BEFORE BREAKFAST  90 tablet  1  . ibandronate (BONIVA) 150 MG tablet SEE NOTES  1 tablet  11  . metFORMIN (GLUCOPHAGE) 500 MG tablet TAKE 1 TABLET BY MOUTH TWICE DAILY WITH A MEAL  60 tablet  5  . mirtazapine (REMERON) 15 MG tablet Take 1 tablet (15 mg total) by mouth at bedtime.  30 tablet  5  . olmesartan (BENICAR) 20 MG tablet TAKE 1 TABLET BY MOUTH DAILY .  30 tablet  5  . simvastatin (ZOCOR) 20 MG tablet TAKE  1 TABLET BY MOUTH EVERY NIGHT AT BEDTIME.  30 tablet  5  . zolpidem (AMBIEN) 5 MG tablet Take 5 mg by mouth at bedtime as needed. For sleep.       No current facility-administered medications on file prior to visit.      Review of Systems  Constitutional: Negative for fever, chills, appetite change and fatigue.  HENT: Negative.   Eyes: Negative.   Respiratory: Negative for cough, choking, chest tightness, shortness of breath and wheezing.   Cardiovascular: Negative for chest pain, palpitations and leg swelling.  Gastrointestinal: Negative.   Endocrine: Negative.   Genitourinary: Negative for menstrual problem and pelvic pain.  Musculoskeletal: Positive for gait problem.       Previous CVA    Neurological: Positive for weakness (chronic) and numbness. Negative for seizures and speech difficulty.       Previous CVA  Psychiatric/Behavioral: Negative for suicidal ideas, behavioral problems, confusion, sleep disturbance, dysphoric mood and agitation. The patient is not nervous/anxious.        Objective:    Physical Exam  Constitutional: She is oriented to person, place, and time. She appears well-developed and well-nourished. No distress.  HENT:  Head: Normocephalic and atraumatic.  Right Ear: External ear normal.  Left Ear: External ear normal.  Eyes: Conjunctivae are normal. Pupils are equal, round, and reactive to light. Right eye exhibits no discharge. Left eye exhibits no discharge.  Neck: Normal range of motion. Neck supple. No JVD present.  Cardiovascular: Normal rate, regular rhythm and normal heart sounds.  Exam reveals no gallop and no friction rub.   No murmur heard. Pulmonary/Chest: Effort normal and breath sounds normal. No respiratory distress. She has no wheezes. She exhibits no tenderness.  Neurological: She is alert and oriented to person, place, and time. She displays atrophy (left arm and leg - Chronic - Previous CVA ). She displays no tremor. She displays no seizure activity.  Skin: Skin is warm, dry and intact.  Psychiatric: She has a normal mood and affect. Her behavior is normal. Judgment normal.   Wt Readings from Last 3 Encounters:  09/08/13 127 lb 12.8 oz (57.97 kg)  08/10/13 125 lb (56.7 kg)  05/12/13 125 lb (56.7 kg)   Filed Vitals:   09/08/13 1020  BP: 140/78  Pulse: 101  Temp: 98.9 F (37.2 C)  TempSrc: Oral  Height: 5\' 2"  (1.575 m)  Weight: 127 lb 12.8 oz (57.97 kg)  SpO2: 97%   BP Readings from Last 3 Encounters:  09/08/13 140/78  08/10/13 145/96  05/12/13 126/80    Lab Results  Component Value Date   WBC 4.4* 04/21/2013   HGB 10.4* 04/21/2013   HCT 31.3* 04/21/2013   PLT 217.0 04/21/2013   GLUCOSE 108* 04/21/2013   CHOL 217* 04/21/2013   TRIG 83.0 04/21/2013   HDL 95.00 04/21/2013   LDLDIRECT 108.6 04/21/2013   LDLCALC 86 08/12/2012   ALT 6 06/05/2012   AST 13 06/05/2012   NA 134* 04/21/2013   K 5.4* 04/21/2013   CL 104 04/21/2013   CREATININE 1.4* 04/21/2013   BUN 20 04/21/2013   CO2 26 04/21/2013   TSH 2.57 04/21/2012   INR 0.93 06/05/2012    HGBA1C 5.8 04/21/2013        Assessment & Plan:  Diabetes- controlled,  Ordered HgA1C.  Last HgA1C was 5.8 on 04/21/13.  Will assess diabetic control and next visit.    Current medication proving effective, no medication change today.    Hypertension -  controlled, Will review blood pressure at the next 6 month follow-up.  No medication change today. The current medical regimen is effective;  continue present plan and medications.   Hyperlipidemia- on statin. Lipid panel ordered for today to insure hyperlipidemia is under control.  Will notify the patient if results are out of normal range.    Osteoporosis controlled,  DEXA scan ordered to assess osteoporosis.  Will schedule follow up after DEXA scan to discuss options pending results.    Depression - controlled,  Medication is effective,  No medication change today.    Appropriate vaccines administered today-  Influenza  Patient was educated on age appropriate diet and exercise.  Patient was instructed to notify the office if any new symptoms appear or any current symptoms became worse  Claiborne Rigg   I have personally reviewed this case with PA student. I also personally examined this patient. I agree with history and findings as documented above. I reviewed, discussed and approve of the assessment and plan as listed above. Rene Paci, MD

## 2013-09-17 ENCOUNTER — Other Ambulatory Visit: Payer: Self-pay

## 2013-09-28 ENCOUNTER — Ambulatory Visit (INDEPENDENT_AMBULATORY_CARE_PROVIDER_SITE_OTHER)
Admission: RE | Admit: 2013-09-28 | Discharge: 2013-09-28 | Disposition: A | Discharge status: Home / Self Care | Payer: Medicare Other | Source: Ambulatory Visit | Attending: Internal Medicine | Admitting: Internal Medicine

## 2013-09-28 DIAGNOSIS — M810 Age-related osteoporosis without current pathological fracture: Secondary | ICD-10-CM

## 2013-10-20 ENCOUNTER — Other Ambulatory Visit: Payer: Self-pay | Admitting: Internal Medicine

## 2013-11-13 ENCOUNTER — Other Ambulatory Visit: Payer: Self-pay | Admitting: Internal Medicine

## 2013-11-16 ENCOUNTER — Other Ambulatory Visit: Payer: Self-pay | Admitting: Internal Medicine

## 2013-11-21 DIAGNOSIS — S52502A Unspecified fracture of the lower end of left radius, initial encounter for closed fracture: Secondary | ICD-10-CM | POA: Insufficient documentation

## 2013-11-22 ENCOUNTER — Ambulatory Visit (INDEPENDENT_AMBULATORY_CARE_PROVIDER_SITE_OTHER): Payer: Medicare Other | Admitting: Emergency Medicine

## 2013-11-22 ENCOUNTER — Ambulatory Visit: Payer: Medicare Other

## 2013-11-22 VITALS — BP 136/80 | HR 81 | Temp 99.2°F | Resp 15 | Ht 63.0 in | Wt 123.0 lb

## 2013-11-22 DIAGNOSIS — M25532 Pain in left wrist: Secondary | ICD-10-CM

## 2013-11-22 DIAGNOSIS — M25539 Pain in unspecified wrist: Secondary | ICD-10-CM

## 2013-11-22 MED ORDER — HYDROCODONE-ACETAMINOPHEN 5-325 MG PO TABS: ORAL_TABLET | ORAL | Status: DC

## 2013-11-22 NOTE — Patient Instructions (Signed)
Wrist Fracture A wrist fracture is a break in one of the bones of the wrist. Your wrist is made up of several small bones at the palm of your hand (carpal bones) and the two bones that make up your forearm (radius and ulna). The bones come together to form multiple large and small joints. The shape and design of these joints allow your wrist to bend and straighten, move side-to-side, and rotate, as in twisting your palm up or down. CAUSES  A fracture may occur in any of the bones in your wrist when enough force is applied to the wrist, such as when falling down onto an outstretched hand. Severe injuries may occur from a more forceful injury. SYMPTOMS Symptoms of wrist fractures include tenderness, bruising, and swelling. Additionally, the wrist may hang in an odd position or may be misshaped. DIAGNOSIS To diagnose a wrist fracture, your caregiver will physically examine your wrist. Your caregiver may also request an X-ray exam of your wrist. TREATMENT Treatment depends on many factors, including the nature and location of the fracture, your age, and your activity level. Treatment for wrist fracture can be nonsurgical or surgical. For nonsurgical treatment, a plaster cast or splint may be applied to your wrist if the bone is in a good position (aligned). The cast will stay on for about 6 weeks. If the alignment of your bone is not good, it may be necessary to realign (reduce) it. After the bone is reduced, a splint usually is placed on your wrist to allow for a small amount of normal swelling. After about 1 week, the splint is removed and a cast is added. The cast is removed 2 or 3 weeks later, after the swelling goes down, causing the cast to loosen. Another cast is applied. This cast is removed after about another 2 or 3 weeks, for a total of 4 to 6 weeks of immobilization. Sometimes the position of the bone is so far out of place that surgery is required to apply a device to hold it together as it  heals. If the bone cannot be reduced without cutting the skin around the bone (closed reduction), a cut (incision) is made to allow direct access to the bone to reduce it (open reduction). Depending on the fracture, there are a number of options for holding the bone in place while it heals, including a cast, metal pins, a plate and screws, and a device called an external fixator. With an external fixator, most of the hardware remains outside of the body. HOME CARE INSTRUCTIONS  To lessen swelling, keep your injured wrist elevated and move your fingers as much as possible.  Apply ice to your wrist for the first 1 to 2 days after you have been treated or as directed by your caregiver. Applying ice helps to reduce inflammation and pain.  Put ice in a plastic bag.  Place a towel between your skin and the bag.  Leave the ice on for 15 to 20 minutes at a time, every 2 hours while you are awake.  Do not put pressure on any part of your cast or splint. It may break.  Use a plastic bag to protect your cast or splint from water while bathing or showering. Do not lower your cast or splint into water.  Only take over-the-counter or prescription medicines for pain as directed by your caregiver. SEEK IMMEDIATE MEDICAL CARE IF:   Your cast or splint gets damaged or breaks.  You have continued severe pain   or more swelling than you did before the cast was put on.  Your skin or fingernails below the injury turn blue or gray or feel cold or numb.  You develop decreased feeling in your fingers. MAKE SURE YOU:  Understand these instructions.  Will watch your condition.  Will get help right away if you are not doing well or get worse. Document Released: 05/23/2005 Document Revised: 11/05/2011 Document Reviewed: 08/31/2011 [employer] Patient Information 2014 [employer].  

## 2013-11-22 NOTE — Progress Notes (Signed)
Subjective:  This chart was scribed for Patricia Chris, MD by Patricia Andrews, Medical Scribe. This patient was seen in Room 11 and the patient's care was started at 10:54 AM.   Patient ID: Patricia Andrews, female    DOB: Apr 05, 1926, 80 y.o.   MRN: [id number]  HPI HPI Comments: Patricia Andrews is a 80 y.o. female who presents to the [facility] complaining of a left wrist injury that started after the patient tripped and fell onto her left arm last night.  She also states that she had some ecchymosis to her right knee after she slid on the carpet trying to get up from her fall.  The patient state that she had a TIA which left the left side of her body paralyzed.     Past Medical History  Diagnosis Date  . Stroke 1979    R MCA>>residual spastic L HP; TIA 06/2011  . Diabetes mellitus, type 2   . Hyperlipidemia   . Hypertension   . Arthritis   . Depression     anxiety overlap  . Allergic rhinitis   . Anemia   . Osteoporosis, senile     accidental fall w/ pelvic fx 2006  . Pelvis fracture    Past Surgical History  Procedure Laterality Date  . Abdominal hysterectomy  1960    Fibroid tumors  . Colonoscopy  06/06/2012    Procedure: COLONOSCOPY;  Surgeon: Louis Meckel, MD;  Location: [facility];  Service: Endoscopy;  Laterality: N/A;  Rm 1501. Pt to be discharged after procedure. Patient was only admitted for prep.   Family History  Problem Relation Age of Onset  . Arthritis Mother   . Arthritis Father    History   Social History  . Marital Status: Single    Spouse Name: N/A    Number of Children: 2  . Years of Education: N/A   Occupational History  . RETIRED    Social History Main Topics  . Smoking status: Never Smoker   . Smokeless tobacco: Never Used     Comment: Single, lives alone in independent assisted living since 2001. Support by local daughter  . Alcohol Use: No  . Drug Use: No  . Sexual Activity: Not on file   Other Topics Concern  . Not  on file   Social History Narrative   Lives alone in independent living, single. Never married   Supportive daughter [name] -   No Known Allergies  Review of Systems  Musculoskeletal: Positive for arthralgias (left wrist).  All other systems reviewed and are negative.     Objective:  Physical Exam Physical Exam  Nursing note and vitals reviewed. Constitutional: She is oriented to person, place, and time. She appears well-developed and well-nourished.  HENT:  Head: Normocephalic and atraumatic.  Eyes: EOM are normal.  Neck: Normal range of motion.  Cardiovascular: Normal rate.   Pulmonary/Chest: Effort normal.  Musculoskeletal  there is a flexion deformity of the left-hand. There is significant swelling over the distal radius with pain on flexion extension to  Neurological: She is alert and oriented to person, place, and time. She has a spastic left hemiparesthesias.  Skin: Skin is warm and dry.  Psychiatric: She has a normal mood and affect. Her behavior is normal.   [facility] reading (PRIMARY) by  Dr. Cleta Alberts they're essentially distal radius fracture with acceptable alignment     BP 136/80  Pulse 81  Temp(Src) 99.2 F (37.3 C) (Oral)  Resp 15  Ht 5\' 3"  (1.6 m)  Wt 123 lb (55.792 kg)  BMI 21.79 kg/m2  SpO2 98% Assessment & Plan:  Patient with a left spastic hemiparesis and now with a left distal radius fracture. Sugar tong splint will be placed in referral made range per orthopedics she will be on a low dose hydrocodone for severe pain.  I personally performed the services described in this documentation, which was scribed in my presence. The recorded information has been reviewed and is accurate.

## 2013-11-24 ENCOUNTER — Encounter: Payer: Self-pay | Admitting: Internal Medicine

## 2013-12-07 ENCOUNTER — Ambulatory Visit (INDEPENDENT_AMBULATORY_CARE_PROVIDER_SITE_OTHER): Payer: Medicare Other | Admitting: Podiatry

## 2013-12-07 ENCOUNTER — Encounter: Payer: Self-pay | Admitting: Podiatry

## 2013-12-07 VITALS — BP 137/70 | HR 74

## 2013-12-07 DIAGNOSIS — M79609 Pain in unspecified limb: Secondary | ICD-10-CM

## 2013-12-07 DIAGNOSIS — M79606 Pain in leg, unspecified: Secondary | ICD-10-CM

## 2013-12-07 DIAGNOSIS — E119 Type 2 diabetes mellitus without complications: Secondary | ICD-10-CM

## 2013-12-07 DIAGNOSIS — B351 Tinea unguium: Secondary | ICD-10-CM

## 2013-12-07 NOTE — Patient Instructions (Signed)
Seen for hypertrophic nails and calluses. All nails and calluses debrided. Return in 3 months or as needed.  

## 2013-12-07 NOTE — Progress Notes (Signed)
Subjective:  80 year old female presents requesting toe nails and calluses trimmed.  Her left arm is in brace due to broken arm that happened recently. Wears AFO on left foot and leg, and ambulating well using a cane.   Objective:  Hypertrophic nails x 10.  Corn on 5th right.  Plantar callus on 2nd MPJ right.  Left foot pedal pulses are not palpable, right side is normal with palpable pedal pulses.   Assessment:  Dystrophic nails x 10.  Painful feet with corns and toe nails.  Status post stroke with paralysis on left side arm and leg.  Recent episode of broken arm left.   Plan:  All nails, corns and calluses debrided.  Return in 3 months or as needed.

## 2013-12-08 ENCOUNTER — Telehealth: Payer: Self-pay | Admitting: Internal Medicine

## 2013-12-08 ENCOUNTER — Other Ambulatory Visit: Payer: Self-pay | Admitting: Internal Medicine

## 2013-12-08 NOTE — Telephone Encounter (Signed)
Called pt back inform her will send Colin Mulders(Rose Brewer) a msg to go [facility] and get prolia authorize by insurance. Once we hear back from her I will call her nad let her know the status...Raechel Chute/lmb

## 2013-12-08 NOTE — Telephone Encounter (Signed)
Pt wants to know if she needs to start Prolia.  If so, when can it be arranged? Please call Patricia Andrews her daughter with the details at [phone] [facility].

## 2013-12-14 ENCOUNTER — Telehealth: Payer: Self-pay | Admitting: *Deleted

## 2013-12-14 NOTE — Telephone Encounter (Signed)
Called pt to inform of status on prolia. She wanted me to call daughter to make appt. Called daughter [name](Hazel) gave her status on prolia. Daughter states mom fell 3 weeks ago and broke her wrist. Needing to actually see md sooner than July. Inform daughter can make f/u appt on fall nd we can give the prolia then. Transferred to schedulers to make appt...Raechel Chute/lmb

## 2013-12-14 NOTE — Telephone Encounter (Signed)
Message copied by Deatra JamesBRAND, LUCY M on Mon Dec 14, 2013 10:59 AM ------      Message from: Charmian MuffBREWER, ROSE D      Created: Fri Dec 11, 2013  2:38 PM      Regarding: FW: Prolia       Good afternoon Lucy,            I have just gotten the [employer] insurance verification back for Ms. Patricia StallionGeorge.  With an office visit Patricia Andrews's copay will be $10. Without an office her copay will be 20%, which would come to approx $180.            If you have any questions please let me know.            Hope you enjoy your weekend! :)      ----- Message -----         From: Randal Bubaose D Brewer         Sent: 12/08/2013   2:27 PM           To: Deatra JamesLucy M Brand, MA      Subject: RE: Benjamin StainProlia                                               Good afternoon Valentina GuLucy,            I have sent Patricia Andrews's info to [employer] for [employer] verification.  As soon as I hear back from them, I will let you know.            Thank you!      Rose      ----- Message -----         From: Deatra JamesLucy M Brand, MA         Sent: 12/08/2013  12:01 PM           To: Randal Bubaose D Brewer      Subject: Patricia CarotaProlia                                                   Hey Rose, Here is another patient that Dr. Felicity CoyerLeschber want to get set-up to start getting prolia injections. Let me know status and i will call pt!            Thanks Valentina GuLucy, RMA             ------

## 2013-12-22 ENCOUNTER — Other Ambulatory Visit (INDEPENDENT_AMBULATORY_CARE_PROVIDER_SITE_OTHER): Payer: Medicare Other

## 2013-12-22 ENCOUNTER — Encounter: Payer: Self-pay | Admitting: Internal Medicine

## 2013-12-22 ENCOUNTER — Ambulatory Visit (INDEPENDENT_AMBULATORY_CARE_PROVIDER_SITE_OTHER): Payer: Medicare Other | Admitting: Internal Medicine

## 2013-12-22 VITALS — BP 148/70 | HR 85 | Temp 98.5°F | Wt 127.4 lb

## 2013-12-22 DIAGNOSIS — W19XXXA Unspecified fall, initial encounter: Secondary | ICD-10-CM

## 2013-12-22 DIAGNOSIS — E119 Type 2 diabetes mellitus without complications: Secondary | ICD-10-CM

## 2013-12-22 DIAGNOSIS — S52502A Unspecified fracture of the lower end of left radius, initial encounter for closed fracture: Secondary | ICD-10-CM

## 2013-12-22 DIAGNOSIS — Y92009 Unspecified place in unspecified non-institutional (private) residence as the place of occurrence of the external cause: Secondary | ICD-10-CM

## 2013-12-22 DIAGNOSIS — S52599A Other fractures of lower end of unspecified radius, initial encounter for closed fracture: Secondary | ICD-10-CM

## 2013-12-22 DIAGNOSIS — I10 Essential (primary) hypertension: Secondary | ICD-10-CM

## 2013-12-22 DIAGNOSIS — M810 Age-related osteoporosis without current pathological fracture: Secondary | ICD-10-CM

## 2013-12-22 LAB — HEMOGLOBIN A1C: HEMOGLOBIN A1C: 5.9 % (ref 4.6–6.5)

## 2013-12-22 MED ORDER — DENOSUMAB 60 MG/ML ~~LOC~~ SOLN
60.0000 mg | Freq: Once | SUBCUTANEOUS | Status: AC
  Administered 2013-12-22: 60 mg via SUBCUTANEOUS

## 2013-12-22 NOTE — Progress Notes (Signed)
Patricia SkinnerCarmen Andrews [id number]-Jan-2027 12/22/2013  Chief Complaint  Patient presents with  . Follow-up    On fall- also getting prolia    Subjective  HPI  Patient here for f/u after fall on 11/22/13 and for Prolia injection.  Osteoporosis: Last bone density scan 09/08/13: t-score 2.8 suggestive of osteoporosis. Here for Prolia injection. D/c'd Boniva. Received pre-authorization for Prolia by insurance- here for injection.  Fracture of left distal radius: Fall on 11/22/12. [facility]/ [facility] Cle[facility]ub [facility] showed fracture of left distal radius. [facility] sugar tong splint. Orthopaedist applied cast to wrist-caused swelling, redness, bruising, and blister on extensor surface of left forearm. Then given abx ointment for skin and placed in sugar tong splint instead. Has been in sugar tong splint X 4 wks and will f/u w/ orthopaedist in 4 wks for x-ray to assess  fracture. Also given a sling to wear. Elevates wrist when sitting and sleeping. Denies pain, edema, and evidence of purulence. Pertinent positives include slight warmth, bruising,and erythema around irritated skin, though sxs have improved.  Daughter plans to call home health nurse to aid patient w/ wrist splint. Currently has aid helping her 2 X a week.   Diabetes Mellitus II: Diabetes controlled on current medical regimen of Glimepiride and Metformin. Previously stable according to A1C on 09/08/13: 6.1   HTN: controlled.   Depression: Says she is alone alot. Denies feeling depressed and SI/HI. Stable on current regimen of celexa, remeron, and ambien (for sleep).      Past Medical History  Diagnosis Date  . Stroke 1979    R MCA>>residual spastic L HP; TIA 06/2011  . Diabetes mellitus, type 2   . Hyperlipidemia   . Hypertension   . Arthritis   . Depression     anxiety overlap  . Allergic rhinitis   . Anemia   . Osteoporosis, senile     accidental fall w/ pelvic fx 2006  . Pelvis fracture     Past  Surgical History  Procedure Laterality Date  . Abdominal hysterectomy  1960    Fibroid tumors  . Colonoscopy  06/06/2012    Procedure: COLONOSCOPY;  Surgeon: Louis Meckelobert D Kaplan, MD;  Location: [facility];  Service: Endoscopy;  Laterality: N/A;  Rm 1501. Pt to be discharged after procedure. Patient was only admitted for prep.    Family History  Problem Relation Age of Onset  . Arthritis Mother   . Arthritis Father     History  Substance Use Topics  . Smoking status: Never Smoker   . Smokeless tobacco: Never Used     Comment: Single, lives alone in independent assisted living since 2001. Support by local daughter  . Alcohol Use: No    Current Outpatient Prescriptions on File Prior to Visit  Medication Sig Dispense Refill  . amLODipine (NORVASC) 5 MG tablet TAKE 1 TABLET BY MOUTH EVERY DAY  30 tablet  11  . bisacodyl (DULCOLAX) 5 MG EC tablet Take 5 mg by mouth daily as needed. For constipation.      . chlorhexidine (PERIDEX) 0.12 % solution       . cholecalciferol (VITAMIN D) 1000 UNITS tablet Take 1,000 Units by mouth daily.      . citalopram (CELEXA) 20 MG tablet Take 20 mg by mouth daily.      . clopidogrel (PLAVIX) 75 MG tablet TAKE 1 TABLET DAILY.  30 tablet  11  . ferrous sulfate 325 (65 FE) MG tablet Take 325 mg by  mouth daily with breakfast.      . glimepiride (AMARYL) 2 MG tablet TAKE 1 TABLET BY MOUTH EVERY DAY BEFORE BREAKFAST  90 tablet  3  . HYDROcodone-acetaminophen (NORCO) 5-325 MG per tablet Take one half tablet every 6 hours as needed for pain  20 tablet  0  . ibandronate (BONIVA) 150 MG tablet SEE NOTES  1 tablet  11  . metFORMIN (GLUCOPHAGE) 500 MG tablet TAKE 1 TABLET BY MOUTH TWICE DAILY WITH A MEAL  60 tablet  5  . mirtazapine (REMERON) 15 MG tablet TAKE 1 TABLET BY MOUTH EVERY NIGHT AT BEDTIME  90 tablet  3  . olmesartan (BENICAR) 20 MG tablet TAKE 1 TABLET BY MOUTH DAILY .  30 tablet  5  . simvastatin (ZOCOR) 20 MG tablet TAKE 1 TABLET BY MOUTH EVERY NIGHT  AT BEDTIME.  30 tablet  5  . zolpidem (AMBIEN) 5 MG tablet Take 5 mg by mouth at bedtime as needed. For sleep.       No current facility-administered medications on file prior to visit.     Allergies:No Known Allergies  Review of Systems  Constitutional: Negative for weight loss and malaise/fatigue.  HENT: Negative for ear discharge and ear pain.   Eyes: Negative for photophobia.  Respiratory: Negative for shortness of breath and wheezing.   Cardiovascular: Negative for chest pain and leg swelling.  Gastrointestinal: Negative for abdominal pain.       No bowel changes.  Genitourinary: Negative for frequency.       No urinary changes.  Musculoskeletal: Positive for falls. Negative for back pain and neck pain.  Skin: Negative for rash.       Slight erythema, bruising, and warmth on flexor surface of left arm.        Objective  Filed Vitals:   12/22/13 1039  BP: 148/70  Pulse: 85  Temp: 98.5 F (36.9 C)  TempSrc: Oral  Weight: 127 lb 6.4 oz (57.788 kg)  SpO2: 98%    Physical Exam  Constitutional: She appears well-developed and well-nourished. No distress.  HENT:  Right Ear: External ear normal.  Left Ear: External ear normal.  Nose: Nose normal.  Mouth/Throat: Oropharynx is clear and moist. No oropharyngeal exudate.  Some cerumen in left ear canal.  Eyes: Left eye exhibits no discharge. No scleral icterus.  Neck: No thyromegaly present.  Cardiovascular: Normal rate, regular rhythm, normal heart sounds and intact distal pulses.  Exam reveals no gallop and no friction rub.   No murmur heard. Respiratory: Effort normal and breath sounds normal. She has no wheezes. She has no rales.  Musculoskeletal: She exhibits no edema and no tenderness.  Lymphadenopathy:    She has no cervical adenopathy.  Skin: Skin is warm. There is erythema.  Psychiatric: She has a normal mood and affect. Her behavior is normal. Judgment and thought content normal.    BP Readings from Last 3  Encounters:  12/22/13 148/70  12/07/13 137/70  11/22/13 136/80    Wt Readings from Last 3 Encounters:  12/22/13 127 lb 6.4 oz (57.788 kg)  11/22/13 123 lb (55.792 kg)  09/08/13 127 lb 12.8 oz (57.97 kg)    Lab Results  Component Value Date   WBC 4.4* 04/21/2013   HGB 10.4* 04/21/2013   HCT 31.3* 04/21/2013   PLT 217.0 04/21/2013   GLUCOSE 113* 09/08/2013   CHOL 217* 04/21/2013   TRIG 83.0 04/21/2013   HDL 95.00 04/21/2013   LDLDIRECT 108.6 04/21/2013   LDLCALC 86  08/12/2012   ALT 6 06/05/2012   AST 13 06/05/2012   NA 137 09/08/2013   K 5.0 09/08/2013   CL 103 09/08/2013   CREATININE 1.3* 09/08/2013   BUN 16 09/08/2013   CO2 26 09/08/2013   TSH 2.57 04/21/2012   INR 0.93 06/05/2012   HGBA1C 6.1 09/08/2013    Dg Bone Density  11/24/2013   Osteoporosis with a t-score of -2.8 at left femoral neck  Scoliosis of spine      Assessment and Plan  Osteoporosis: Bone density scan 09/08/12: t-score of 2.8. D/c'd Boniva. Received pre-authorization by insurance for Prolia. Administer prolia injection today. Told patient to come back for next injection in 6 mos.  Fracture of left distal radius: Currently wearing sugar tong splint for fracture of left distal radius X 4 wks and will wear it for at least 4 more wks. [facility] 4 wks for f/u x-ray to assess fracture. Continue to apply neosporin BID to irritated skin on flexor surface of left forearm. Talked to patient about importance of wearing splint at all times, except for when applying abx ointment, to ensure fracture is stabilized.   Diabetes Mellitus II: Previously stable on current medication regimen: 09/08/13 A1 C: 6.1. Continue current medication regimen. Order A1C today. Next A1C will be ordered in 6 mos.   HTN: Stable on current regimen. Continue current medication regimen.  Depression: Stable on current regimen. Continue current medication regimen.   No Follow-up on file. Larwance RoteKatie G Cecily Lawhorne, Student-PA

## 2013-12-22 NOTE — Assessment & Plan Note (Signed)
BP Readings from Last 3 Encounters:  12/22/13 148/70  12/07/13 137/70  11/22/13 136/80   The current medical regimen is effective;  continue present plan and medications.

## 2013-12-22 NOTE — Patient Instructions (Signed)
It was good to see you today.  We have reviewed your prior records including labs and tests today  Test(s) ordered today. Your results will be released to MyChart (or called to you) after review, usually within 72hours after test completion. If any changes need to be made, you will be notified at that same time.  Prolia given today - will repeat every 6 months  Medications reviewed and updated, no changes recommended at this time.  Continue working with Dr Melvyn Novasrtmann  Please schedule followup in 6 months for diabetes mellitus check (cancel July appt), call sooner if problems.

## 2013-12-22 NOTE — Progress Notes (Signed)
Subjective:    Patient ID: Patricia Andrews, female    DOB: 1926/06/22, 80 y.o.   MRN: [id number]  HPI  Patient is here for follow up  Reviewed chronic medical issues and interval medical events  Past Medical History  Diagnosis Date  . Stroke 1979    R MCA>>residual spastic L HP; TIA 06/2011  . Diabetes mellitus, type 2   . Hyperlipidemia   . Hypertension   . Arthritis   . Depression     anxiety overlap  . Allergic rhinitis   . Anemia   . Osteoporosis, senile     accidental fall w/ pelvic fx 2006  . Pelvis fracture     Review of Systems  Constitutional: Negative for fatigue and unexpected weight change.  Respiratory: Negative for cough and shortness of breath.   Neurological: Negative for syncope and weakness.       Objective:   Physical Exam  BP 148/70  Pulse 85  Temp(Src) 98.5 F (36.9 C) (Oral)  Wt 127 lb 6.4 oz (57.788 kg)  SpO2 98% Wt Readings from Last 3 Encounters:  12/22/13 127 lb 6.4 oz (57.788 kg)  11/22/13 123 lb (55.792 kg)  09/08/13 127 lb 12.8 oz (57.97 kg)    Constitutional: She appears well-developed and well-nourished. No distress. Dtr at side Neck: Normal range of motion. Neck supple. No JVD present. No thyromegaly present.  Cardiovascular: Normal rate, regular rhythm and normal heart sounds.  No murmur heard. No BLE edema. Pulmonary/Chest: Effort normal and breath sounds normal. No respiratory distress. She has no wheezes.  MSkel: L wrist in splint, neurovasc intact distally Neuro: L HP unchanged Psychiatric: She has a normal mood and affect. Her behavior is normal. Judgment and thought content normal.   Lab Results  Component Value Date   WBC 4.4* 04/21/2013   HGB 10.4* 04/21/2013   HCT 31.3* 04/21/2013   PLT 217.0 04/21/2013   GLUCOSE 113* 09/08/2013   CHOL 217* 04/21/2013   TRIG 83.0 04/21/2013   HDL 95.00 04/21/2013   LDLDIRECT 108.6 04/21/2013   LDLCALC 86 08/12/2012   ALT 6 06/05/2012   AST 13 06/05/2012   NA 137 09/08/2013   K 5.0  09/08/2013   CL 103 09/08/2013   CREATININE 1.3* 09/08/2013   BUN 16 09/08/2013   CO2 26 09/08/2013   TSH 2.57 04/21/2012   INR 0.93 06/05/2012   HGBA1C 6.1 09/08/2013    Dg Bone Density  11/24/2013   Osteoporosis with a t-score of -2.8 at left femoral neck  Scoliosis of spine      Assessment & Plan:   Problem List Items Addressed This Visit   Diabetes type 2, controlled (Chronic)      On Amaryl and metformin Previously AM hypoglycemia - reduced amaryl 11/2012. On ARB, statin - annual eval with optho and foot exam Check A1c now and titrate medications as needed  Lab Results  Component Value Date   HGBA1C 6.1 09/08/2013      Relevant Orders      Hemoglobin A1c   Fracture of radius, distal, left, closed   Hypertension      BP Readings from Last 3 Encounters:  12/22/13 148/70  12/07/13 137/70  11/22/13 136/80   The current medical regimen is effective;  continue present plan and medications.    Senile osteoporosis - Primary     History of pelvic fracture following accidental fall 2006. On calcium supplement and weekly bisphosphonate since that time. 2nd fall and fracture L distal  radius (wrist) 10/2013 Mild GI upset with actonel and low sugar while empty/waiting after ingestion  [facility]os [facility], change to Prolia now based on DEXA 09/2013 (reports show no delay due to Prolia with rate of fracture healing) 1st administration today (12/22/2013)    Relevant Medications      denosumab (PROLIA) injection 60 mg (Completed)     To continue working with ortho hand for left distal radius fracture 10/2013 as ongoing, interval history reviewed, no changes recommended  Recommended  [facility] - pt/dtr agree

## 2013-12-22 NOTE — Progress Notes (Signed)
Pre visit review using our clinic review tool, if applicable. No additional management support is needed unless otherwise documented below in the visit note. 

## 2013-12-22 NOTE — Assessment & Plan Note (Signed)
On Amaryl and metformin Previously AM hypoglycemia - reduced amaryl 11/2012. On ARB, statin - annual eval with optho and foot exam Check A1c now and titrate medications as needed  Lab Results  Component Value Date   HGBA1C 6.1 09/08/2013

## 2013-12-22 NOTE — Assessment & Plan Note (Signed)
History of pelvic fracture following accidental fall 2006. On calcium supplement and weekly bisphosphonate since that time. 2nd fall and fracture L distal radius (wrist) 10/2013 Mild GI upset with actonel and low sugar while empty/waiting after ingestion  [facility], change to Prolia now based on DEXA 09/2013 (reports show no delay due to Prolia with rate of fracture healing) 1st administration today (12/22/2013)

## 2013-12-29 ENCOUNTER — Other Ambulatory Visit: Payer: Self-pay | Admitting: Internal Medicine

## 2014-01-06 ENCOUNTER — Telehealth: Payer: Self-pay | Admitting: Internal Medicine

## 2014-01-06 NOTE — Telephone Encounter (Signed)
Lyla Sonarrie, Physical Therapist with [facility] is called and LVM on triage line to let Dr. Felicity CoyerLeschber know that she has completed her assessment today on the patient and would like to continue PT 2x a week for one week, 1x a week for one week and 2x a week for another 2 weeks. She can be reached at [phone] with any questions or concerns.

## 2014-01-07 NOTE — Telephone Encounter (Signed)
Verbal ok for orders as requested 

## 2014-01-07 NOTE — Telephone Encounter (Signed)
Called Carrie back no answer LMOM with md response...Raechel Chute/lmb

## 2014-01-16 ENCOUNTER — Emergency Department (HOSPITAL_COMMUNITY)
Admission: EM | Admit: 2014-01-16 | Discharge: 2014-01-17 | Disposition: A | Discharge status: Home / Self Care | Payer: Medicare Other | Attending: Emergency Medicine | Admitting: Emergency Medicine

## 2014-01-16 ENCOUNTER — Encounter (HOSPITAL_COMMUNITY): Payer: Self-pay | Admitting: Emergency Medicine

## 2014-01-16 DIAGNOSIS — I10 Essential (primary) hypertension: Secondary | ICD-10-CM | POA: Insufficient documentation

## 2014-01-16 DIAGNOSIS — Y929 Unspecified place or not applicable: Secondary | ICD-10-CM | POA: Insufficient documentation

## 2014-01-16 DIAGNOSIS — F341 Dysthymic disorder: Secondary | ICD-10-CM | POA: Insufficient documentation

## 2014-01-16 DIAGNOSIS — M6281 Muscle weakness (generalized): Secondary | ICD-10-CM | POA: Insufficient documentation

## 2014-01-16 DIAGNOSIS — R296 Repeated falls: Secondary | ICD-10-CM | POA: Insufficient documentation

## 2014-01-16 DIAGNOSIS — E119 Type 2 diabetes mellitus without complications: Secondary | ICD-10-CM | POA: Insufficient documentation

## 2014-01-16 DIAGNOSIS — Z79899 Other long term (current) drug therapy: Secondary | ICD-10-CM | POA: Insufficient documentation

## 2014-01-16 DIAGNOSIS — Y9389 Activity, other specified: Secondary | ICD-10-CM | POA: Insufficient documentation

## 2014-01-16 DIAGNOSIS — D649 Anemia, unspecified: Secondary | ICD-10-CM | POA: Insufficient documentation

## 2014-01-16 DIAGNOSIS — M129 Arthropathy, unspecified: Secondary | ICD-10-CM | POA: Insufficient documentation

## 2014-01-16 DIAGNOSIS — E785 Hyperlipidemia, unspecified: Secondary | ICD-10-CM | POA: Insufficient documentation

## 2014-01-16 DIAGNOSIS — W19XXXA Unspecified fall, initial encounter: Secondary | ICD-10-CM

## 2014-01-16 DIAGNOSIS — Z9181 History of falling: Secondary | ICD-10-CM | POA: Insufficient documentation

## 2014-01-16 DIAGNOSIS — Z7902 Long term (current) use of antithrombotics/antiplatelets: Secondary | ICD-10-CM | POA: Insufficient documentation

## 2014-01-16 DIAGNOSIS — Z8781 Personal history of (healed) traumatic fracture: Secondary | ICD-10-CM | POA: Insufficient documentation

## 2014-01-16 DIAGNOSIS — I69998 Other sequelae following unspecified cerebrovascular disease: Secondary | ICD-10-CM | POA: Insufficient documentation

## 2014-01-16 DIAGNOSIS — Z043 Encounter for examination and observation following other accident: Secondary | ICD-10-CM | POA: Insufficient documentation

## 2014-01-16 DIAGNOSIS — M810 Age-related osteoporosis without current pathological fracture: Secondary | ICD-10-CM | POA: Insufficient documentation

## 2014-01-16 NOTE — ED Notes (Signed)
Dr. Zavitz is at the bedside.  

## 2014-01-16 NOTE — ED Notes (Signed)
Patient presents with family stating that the patient was going into the bathroom to get some water to take her Ambien and the patient states she does not know what happened.  Fell on her left arm (cast was removed 6 days ago) then rolled back and hit her head.  Family and patient denies LOC

## 2014-01-16 NOTE — ED Notes (Signed)
Pt paralyzed on LT side

## 2014-01-16 NOTE — ED Provider Notes (Signed)
CSN: [id number]     Arrival date & time 01/16/14  2228 History   First MD Initiated Contact with Patient 01/16/14 2329     Chief Complaint  Patient presents with  . Fall     (Consider location/radiation/quality/duration/timing/severity/associated sxs/prior Treatment) HPI Comments: A 80 year old female diabetes, CVA with left-sided weakness chronic, high blood pressure, lipids, osteoporosis, depression, lives alone with close family support, radius fracture the cast 7 weeks prior nonsurgical presents after fall prior to arrival. Patient was getting ready for bed and going to the bathroom and had a fall causing her to land on her left arm. Patient hit her back and mildly hit the back of her head without loss of consciousness. Patient had no symptoms prior to falling currently no symptoms. Patient feels well however was not sure exactly why she fell.  Patient is a 80 y.o. female presenting with fall. The history is provided by the patient and a relative.  Fall Pertinent negatives include no chest pain, no abdominal pain, no headaches and no shortness of breath.    Past Medical History  Diagnosis Date  . Stroke 1979    R MCA>>residual spastic L HP; TIA 06/2011  . Diabetes mellitus, type 2   . Hyperlipidemia   . Hypertension   . Arthritis   . Depression     anxiety overlap  . Allergic rhinitis   . Anemia   . Osteoporosis, senile     accidental fall w/ pelvic fx 2006, L wrist fx 10/2013  . Pelvis fracture    Past Surgical History  Procedure Laterality Date  . Abdominal hysterectomy  1960    Fibroid tumors  . Colonoscopy  06/06/2012    Procedure: COLONOSCOPY;  Surgeon: Louis Meckel, MD;  Location: [facility];  Service: Endoscopy;  Laterality: N/A;  Rm 1501. Pt to be discharged after procedure. Patient was only admitted for prep.   Family History  Problem Relation Age of Onset  . Arthritis Mother   . Arthritis Father    History  Substance Use Topics  . Smoking status:  Never Smoker   . Smokeless tobacco: Never Used     Comment: Single, lives alone in independent assisted living since 2001. Support by local daughter  . Alcohol Use: No   OB History   Grav Para Term Preterm Abortions TAB SAB Ect Mult Living                 Review of Systems  Constitutional: Negative for fever and chills.  HENT: Negative for congestion.   Eyes: Negative for visual disturbance.  Respiratory: Negative for shortness of breath.   Cardiovascular: Negative for chest pain.  Gastrointestinal: Negative for vomiting and abdominal pain.  Genitourinary: Negative for dysuria and flank pain.  Musculoskeletal: Negative for back pain, neck pain and neck stiffness.  Skin: Negative for rash.  Neurological: Positive for weakness. Negative for syncope (chronic left-sided), light-headedness and headaches.      Allergies  Review of patient's allergies indicates no known allergies.  Home Medications   Prior to Admission medications   Medication Sig Start Date End Date Taking? Authorizing Provider  amLODipine (NORVASC) 5 MG tablet TAKE 1 TABLET BY MOUTH EVERY DAY 10/20/13   Newt Lukes, MD  bisacodyl (DULCOLAX) 5 MG EC tablet Take 5 mg by mouth daily as needed. For constipation.    Historical Provider, MD  chlorhexidine (PERIDEX) 0.12 % solution  03/12/13   Historical Provider, MD  cholecalciferol (VITAMIN D) 1000 UNITS tablet Take  1,000 Units by mouth daily.    Historical Provider, MD  citalopram (CELEXA) 20 MG tablet Take 20 mg by mouth daily.    Historical Provider, MD  clopidogrel (PLAVIX) 75 MG tablet TAKE 1 TABLET DAILY. 11/16/13   Newt Lukes, MD  ferrous sulfate 325 (65 FE) MG tablet Take 325 mg by mouth daily with breakfast.    Historical Provider, MD  glimepiride (AMARYL) 2 MG tablet TAKE 1 TABLET BY MOUTH EVERY DAY BEFORE BREAKFAST 12/08/13   Newt Lukes, MD  HYDROcodone-acetaminophen [facility]) 5-325 MG per tablet Take one half tablet every 6 hours as needed  for pain 11/22/13   Collene Gobble, MD  ibandronate (BONIVA) 150 MG tablet SEE NOTES 06/22/13   Newt Lukes, MD  metFORMIN (GLUCOPHAGE) 500 MG tablet TAKE 1 TABLET BY MOUTH TWICE DAILY WITH A MEAL 12/29/13   Newt Lukes, MD  mirtazapine (REMERON) 15 MG tablet TAKE 1 TABLET BY MOUTH EVERY NIGHT AT BEDTIME 12/08/13   Newt Lukes, MD  olmesartan (BENICAR) 20 MG tablet TAKE 1 TABLET BY MOUTH DAILY . 04/16/13   Newt Lukes, MD  simvastatin (ZOCOR) 20 MG tablet TAKE 1 TABLET BY MOUTH EVERY NIGHT AT BEDTIME. 06/22/13   Newt Lukes, MD  zolpidem (AMBIEN) 5 MG tablet Take 5 mg by mouth at bedtime as needed. For sleep.    Historical Provider, MD   BP 152/63  Pulse 74  Temp(Src) 97.9 F (36.6 C) (Oral)  Resp 16  Ht 5\' 2"  (1.575 m)  Wt 127 lb (57.607 kg)  BMI 23.22 kg/m2  SpO2 100% Physical Exam  Nursing note and vitals reviewed. Constitutional: She is oriented to person, place, and time. She appears well-developed and well-nourished.  HENT:  Head: Normocephalic and atraumatic.  Eyes: Conjunctivae are normal. Right eye exhibits no discharge. Left eye exhibits no discharge.  Neck: Normal range of motion. Neck supple. No tracheal deviation present.  Cardiovascular: Normal rate and regular rhythm.   Pulmonary/Chest: Effort normal and breath sounds normal.  Abdominal: Soft. She exhibits no distension. There is no tenderness. There is no guarding.  Musculoskeletal: She exhibits no edema.  No midline vertebral tenderness cervical, lumbar, thoracic.  Neurological: She is alert and oriented to person, place, and time.  Patient has 3+ weakness left arm 4+ weakness left leg which is chronic. Patient has normal strength in the right upper and lower extremity. Cranial nerves intact. Patient conversant alert and oriented. Neck supple.  Skin: Skin is warm. No rash noted.  Psychiatric: She has a normal mood and affect.    ED Course  Procedures (including critical care  time) Labs Review Labs Reviewed  I-STAT CHEM 8, ED - Abnormal; Notable for the following:    Creatinine, Ser 1.20 (*)    Glucose, Bld 220 (*)    Hemoglobin 11.6 (*)    HCT 34.0 (*)    All other components within normal limits  I-STAT TROPOININ, ED    Imaging Review No results found.   EKG Interpretation   Date/Time:  Sunday Jan 17 2014 01:14:27 EDT Ventricular Rate:  72 PR Interval:  121 QRS Duration: 74 QT Interval:  419 QTC Calculation: 458 R Axis:   1 Text Interpretation:  Sinus rhythm Low voltage, precordial leads  Borderline T abnormalities, anterior leads Confirmed by Larrisa Cravey  MD, Emmerie Battaglia  ([id number]) on 01/17/2014 2:05:07 AM      MDM   Final diagnoses:  Fall   Patient well-appearing with no symptoms currently.  Family in the room for discussion of fall. Patient has no bony tenderness no indication for x-ray this time. Discussed CT scan and very low yield was very brief head injury no headache, normal neuro exam except for chronic left-sided weakness. Family comfortable with holding on CT scan and will watch in ED and they will return for worsening symptoms. Discussed screening labs, EKG and close observation.  Pt well on recheck, no sxs. Results and differential diagnosis were discussed with the patient/parent/guardian. Close follow up outpatient was discussed, comfortable with the plan.   Filed Vitals:   01/16/14 2235  BP: 152/63  Pulse: 74  Temp: 97.9 F (36.6 C)  TempSrc: Oral  Resp: 16  Height: 5\' 2"  (1.575 m)  Weight: 127 lb (57.607 kg)  SpO2: 100%       Enid SkeensJoshua M Rosali Augello, MD 01/17/14 [phone]

## 2014-01-17 ENCOUNTER — Encounter (HOSPITAL_COMMUNITY): Payer: Self-pay | Admitting: Emergency Medicine

## 2014-01-17 ENCOUNTER — Emergency Department (HOSPITAL_COMMUNITY): Payer: Medicare Other

## 2014-01-17 ENCOUNTER — Emergency Department (HOSPITAL_COMMUNITY)
Admission: EM | Admit: 2014-01-17 | Discharge: 2014-01-17 | Disposition: A | Discharge status: Home / Self Care | Payer: Medicare Other | Attending: Emergency Medicine | Admitting: Emergency Medicine

## 2014-01-17 DIAGNOSIS — S300XXA Contusion of lower back and pelvis, initial encounter: Secondary | ICD-10-CM

## 2014-01-17 DIAGNOSIS — M545 Low back pain, unspecified: Secondary | ICD-10-CM | POA: Insufficient documentation

## 2014-01-17 DIAGNOSIS — D649 Anemia, unspecified: Secondary | ICD-10-CM | POA: Insufficient documentation

## 2014-01-17 DIAGNOSIS — Z7902 Long term (current) use of antithrombotics/antiplatelets: Secondary | ICD-10-CM | POA: Insufficient documentation

## 2014-01-17 DIAGNOSIS — M129 Arthropathy, unspecified: Secondary | ICD-10-CM | POA: Insufficient documentation

## 2014-01-17 DIAGNOSIS — I69998 Other sequelae following unspecified cerebrovascular disease: Secondary | ICD-10-CM | POA: Insufficient documentation

## 2014-01-17 DIAGNOSIS — Z79899 Other long term (current) drug therapy: Secondary | ICD-10-CM | POA: Insufficient documentation

## 2014-01-17 DIAGNOSIS — I10 Essential (primary) hypertension: Secondary | ICD-10-CM | POA: Insufficient documentation

## 2014-01-17 DIAGNOSIS — E119 Type 2 diabetes mellitus without complications: Secondary | ICD-10-CM | POA: Insufficient documentation

## 2014-01-17 DIAGNOSIS — G8911 Acute pain due to trauma: Secondary | ICD-10-CM | POA: Insufficient documentation

## 2014-01-17 DIAGNOSIS — Z8781 Personal history of (healed) traumatic fracture: Secondary | ICD-10-CM | POA: Insufficient documentation

## 2014-01-17 DIAGNOSIS — M6281 Muscle weakness (generalized): Secondary | ICD-10-CM | POA: Insufficient documentation

## 2014-01-17 DIAGNOSIS — E785 Hyperlipidemia, unspecified: Secondary | ICD-10-CM | POA: Insufficient documentation

## 2014-01-17 DIAGNOSIS — M810 Age-related osteoporosis without current pathological fracture: Secondary | ICD-10-CM | POA: Insufficient documentation

## 2014-01-17 DIAGNOSIS — W19XXXA Unspecified fall, initial encounter: Secondary | ICD-10-CM

## 2014-01-17 DIAGNOSIS — M533 Sacrococcygeal disorders, not elsewhere classified: Secondary | ICD-10-CM | POA: Insufficient documentation

## 2014-01-17 DIAGNOSIS — F341 Dysthymic disorder: Secondary | ICD-10-CM | POA: Insufficient documentation

## 2014-01-17 LAB — I-STAT CHEM 8, ED
BUN: 18 mg/dL (ref 6–23)
CALCIUM ION: 1.19 mmol/L (ref 1.13–1.30)
CHLORIDE: 105 meq/L (ref 96–112)
CREATININE: 1.2 mg/dL — AB (ref 0.50–1.10)
GLUCOSE: 220 mg/dL — AB (ref 70–99)
HCT: 34 % — ABNORMAL LOW (ref 36.0–46.0)
Hemoglobin: 11.6 g/dL — ABNORMAL LOW (ref 12.0–15.0)
Potassium: 4.5 mEq/L (ref 3.7–5.3)
Sodium: 140 mEq/L (ref 137–147)
TCO2: 23 mmol/L (ref 0–100)

## 2014-01-17 LAB — I-STAT TROPONIN, ED: TROPONIN I, POC: 0.02 ng/mL (ref 0.00–0.08)

## 2014-01-17 MED ORDER — HYDROCODONE-ACETAMINOPHEN 5-325 MG PO TABS
1.0000 | ORAL_TABLET | Freq: Once | ORAL | Status: AC
  Administered 2014-01-17: 1 via ORAL
  Filled 2014-01-17: qty 1

## 2014-01-17 MED ORDER — HYDROCODONE-ACETAMINOPHEN 5-325 MG PO TABS: 1.0000 | ORAL_TABLET | Freq: Four times a day (QID) | ORAL | Status: DC | PRN

## 2014-01-17 NOTE — ED Notes (Signed)
Pt was seen here last night for fall, no loc. Was dc home early this am and now having lower back pain "to her tailbone." no acute distress noted at triage.

## 2014-01-17 NOTE — Discharge Instructions (Signed)
If you were given medicines take as directed.  If you are on coumadin or contraceptives realize their levels and effectiveness is altered by many different medicines.  If you have any reaction (rash, tongues swelling, other) to the medicines stop taking and see a physician.   Please follow up as directed and return to the ER or see a physician for new or worsening symptoms.  Thank you. Filed Vitals:   01/17/14 0000 01/17/14 0015 01/17/14 0045 01/17/14 0130  BP: 128/66 135/53 134/60 141/69  Pulse: 73 72 71 68  Temp:      TempSrc:      Resp:    14  Height:      Weight:      SpO2: 100% 99% 99% 96%

## 2014-01-17 NOTE — ED Notes (Signed)
Patient ambulated 3 times around the room with cane and 1 stand by assist.  Patient is capable of ambulating with cane, but does not always allow herself enough time to maintain balance.  She needs frequent reminding to take slower steps to maintain even gait. No lightheadness or dizziness during ambulation.

## 2014-01-17 NOTE — ED Notes (Signed)
Preparing patient for discharge. 

## 2014-01-17 NOTE — ED Notes (Signed)
Patient transported to X-ray 

## 2014-01-17 NOTE — ED Provider Notes (Signed)
CSN: [id number]     Arrival date & time 01/17/14  1342 History   First MD Initiated Contact with Patient 01/17/14 1533     Chief Complaint  Patient presents with  . Fall  . Back Pain     (Consider location/radiation/quality/duration/timing/severity/associated sxs/prior Treatment) Patient is a 80 y.o. female presenting with fall and back pain. The history is provided by the patient and a relative.  Fall Pertinent negatives include no abdominal pain and no shortness of breath.  Back Pain Associated symptoms: weakness   Associated symptoms: no abdominal pain    patient had a fall yesterday. She was seen in the ER. She was not having pain in her back, but states it is developed last night. She fell to her left side and then on to her rear end. She states it is right in the middle. No numbness or weakness. She's just been taking some over-the-counter medicines at home for her. No lack of bladder or bowel control. Chronic left-sided weakness from previous stroke  Past Medical History  Diagnosis Date  . Stroke 1979    R MCA>>residual spastic L HP; TIA 06/2011  . Diabetes mellitus, type 2   . Hyperlipidemia   . Hypertension   . Arthritis   . Depression     anxiety overlap  . Allergic rhinitis   . Anemia   . Osteoporosis, senile     accidental fall w/ pelvic fx 2006, L wrist fx 10/2013  . Pelvis fracture    Past Surgical History  Procedure Laterality Date  . Abdominal hysterectomy  1960    Fibroid tumors  . Colonoscopy  06/06/2012    Procedure: COLONOSCOPY;  Surgeon: Louis Meckel, MD;  Location: [facility];  Service: Endoscopy;  Laterality: N/A;  Rm 1501. Pt to be discharged after procedure. Patient was only admitted for prep.   Family History  Problem Relation Age of Onset  . Arthritis Mother   . Arthritis Father    History  Substance Use Topics  . Smoking status: Never Smoker   . Smokeless tobacco: Never Used     Comment: Single, lives alone in independent assisted  living since 2001. Support by local daughter  . Alcohol Use: No   OB History   Grav Para Term Preterm Abortions TAB SAB Ect Mult Living                 Review of Systems  Respiratory: Negative for shortness of breath.   Gastrointestinal: Negative for abdominal pain.  Musculoskeletal: Positive for back pain. Negative for gait problem and joint swelling.  Skin: Negative for color change and wound.  Neurological: Positive for weakness.      Allergies  Review of patient's allergies indicates no known allergies.  Home Medications   Prior to Admission medications   Medication Sig Start Date End Date Taking? Authorizing Provider  amLODipine (NORVASC) 5 MG tablet Take 5 mg by mouth daily.   Yes Historical Provider, MD  bisacodyl (DULCOLAX) 5 MG EC tablet Take 5 mg by mouth daily as needed. For constipation.   Yes Historical Provider, MD  chlorhexidine (PERIDEX) 0.12 % solution Use as directed 5 mLs in the mouth or throat 2 (two) times daily as needed (gingivitis).  03/12/13  Yes Historical Provider, MD  cholecalciferol (VITAMIN D) 1000 UNITS tablet Take 1,000 Units by mouth daily.   Yes Historical Provider, MD  citalopram (CELEXA) 20 MG tablet Take 20 mg by mouth daily.   Yes Historical Provider, MD  clopidogrel (PLAVIX) 75 MG tablet Take 75 mg by mouth daily with breakfast.   Yes Historical Provider, MD  ferrous sulfate 325 (65 FE) MG tablet Take 325 mg by mouth daily with breakfast.   Yes Historical Provider, MD  glimepiride (AMARYL) 2 MG tablet Take 2 mg by mouth daily with breakfast.   Yes Historical Provider, MD  metFORMIN (GLUCOPHAGE) 500 MG tablet Take 500 mg by mouth 2 (two) times daily with a meal.   Yes Historical Provider, MD  mirtazapine (REMERON) 15 MG tablet Take 15 mg by mouth at bedtime.   Yes Historical Provider, MD  naproxen sodium (ANAPROX) 220 MG tablet Take 220 mg by mouth daily as needed (pain).   Yes Historical Provider, MD  olmesartan (BENICAR) 20 MG tablet Take 20  mg by mouth daily.   Yes Historical Provider, MD  Omega-3 Fatty Acids (FISH OIL PO) Take 1 capsule by mouth every morning.   Yes Historical Provider, MD  simvastatin (ZOCOR) 20 MG tablet Take 20 mg by mouth at bedtime.   Yes Historical Provider, MD  zolpidem (AMBIEN) 5 MG tablet Take 5 mg by mouth at bedtime as needed. For sleep.   Yes Historical Provider, MD   BP 140/107  Pulse 73  Temp(Src) 99.2 F (37.3 C) (Oral)  Resp 12  SpO2 100% Physical Exam  Constitutional: She appears well-developed and well-nourished.  HENT:  Head: Normocephalic and atraumatic.  Neck: Neck supple.  Cardiovascular: Normal rate.   Pulmonary/Chest: Effort normal.  Musculoskeletal: She exhibits tenderness. She exhibits no edema.  Tenderness over lower sacrum/coccyx. No ecchymosis. No tenderness of her pelvis otherwise. Range of motion intact the hips bilaterally. Mild lower lumbar tenderness  Neurological:  Chronic left-sided spasticity  Skin: Skin is warm.    ED Course  Procedures (including critical care time) Labs Review Labs Reviewed - No data to display  Imaging Review Dg Lumbar Spine Complete  01/17/2014   CLINICAL DATA:  Larey Seat yesterday.  Tailbone region pain.  EXAM: LUMBAR SPINE - COMPLETE 4+ VIEW  COMPARISON:  None.  FINDINGS: No fracture. There is a curvature, convex to the right in the lower lumbar spine and to the left at the thoracolumbar junction. The bones are demineralized. No bone lesions.  Schmorl's nodes are noted along the upper endplates L1 and L2. Disc spaces are relatively well maintained. There are facet degenerative changes in the lower lumbar spine. Calcification is noted along a normal caliber abdominal aorta.  IMPRESSION: No fracture or acute finding.   Electronically Signed   By: Amie Portland M.D.   On: 01/17/2014 16:45   Dg Pelvis 1-2 Views  01/17/2014   CLINICAL DATA:  Larey Seat yesterday.  Tailbone region pain.  EXAM: PELVIS - 1-2 VIEW  COMPARISON:  07/08/2006  FINDINGS: No  fracture. No dislocation. There is mild axial hip joint space narrowing on the right. SI joints and symphysis pubis are normally space and aligned as is the left hip joint. Bones are diffusely demineralized. Soft tissues are unremarkable.  IMPRESSION: No fracture or acute finding.   Electronically Signed   By: Amie Portland M.D.   On: 01/17/2014 16:46   Dg Sacrum/coccyx  01/17/2014   CLINICAL DATA:  Larey Seat yesterday.  Tailbone pain.  EXAM: SACRUM AND COCCYX - 2+ VIEW  COMPARISON:  None.  FINDINGS: No convincing fracture. No bone lesion. Bones are demineralized. SI joints are normally aligned. Soft tissues are unremarkable.  IMPRESSION: No fracture or acute finding.   Electronically Signed   By:  Amie Portlandavid  Ormond M.D.   On: 01/17/2014 16:42     EKG Interpretation None      MDM   Final diagnoses:  Fall  Contusion of sacrum    Patient developed pain and tenderness in her sacrum/coccyx area after fall. X-ray is reassuring. Will discharge home. Patient's had hydrocodone in the past has tolerated well    Juliet RudeNathan R. Rubin PayorPickering, MD 01/17/14 [id number]

## 2014-01-17 NOTE — Discharge Instructions (Signed)
Tailbone Injury °The tailbone (coccyx) is the small bone at the lower end of the spine. A tailbone injury may involve stretched ligaments, bruising, or a broken bone (fracture). Women are more vulnerable to this injury due to having a wider pelvis. °CAUSES  °This type of injury typically occurs from falling and landing on the tailbone. Repeated strain or friction from actions such as rowing and bicycling may also injure the area. The tailbone can be injured during childbirth. Infections or tumors may also press on the tailbone and cause pain. Sometimes, the cause of injury is unknown. °SYMPTOMS  °· Bruising. °· Pain when sitting. °· Painful bowel movements. °· In women, pain during intercourse. °DIAGNOSIS  °Your caregiver can diagnose a tailbone injury based on your symptoms and a physical exam. X-rays may be taken if a fracture is suspected. Your caregiver may also use an MRI scan imaging test to evaluate your symptoms. °TREATMENT  °Your caregiver may prescribe medicines to help relieve your pain. Most tailbone injuries heal on their own in 4 to 6 weeks. However, if the injury is caused by an infection or tumor, the recovery period may vary. °PREVENTION  °Wear appropriate padding and sports gear when bicycling and rowing. This can help prevent an injury from repeated strain or friction. °HOME CARE INSTRUCTIONS  °· Put ice on the injured area. °· Put ice in a plastic bag. °· Place a towel between your skin and the bag. °· Leave the ice on for 15-20 minutes, every hour while awake for the first 1 to 2 days. °· Sit on a large, rubber or inflated ring or cushion to ease your pain. Lean forward when sitting to help decrease discomfort. °· Avoid sitting for long periods of time. °· Increase your activity as the pain allows. °· Only take over-the-counter or prescription medicines for pain, discomfort, or fever as directed by your caregiver. °· You may use stool softeners if it is painful to have a bowel movement, or as  directed by your caregiver. °· Eat a diet with plenty of fiber to help prevent constipation. °· Keep all follow-up appointments as directed by your caregiver. °SEEK MEDICAL CARE IF:  °· Your pain becomes worse. °· Your bowel movements cause a great deal of discomfort. °· You are unable to have a bowel movement. °· You have a fever. °MAKE SURE YOU: °· Understand these instructions. °· Will watch your condition. °· Will get help right away if you are not doing well or get worse. °Document Released: 08/10/2000 Document Revised: 11/05/2011 Document Reviewed: 03/08/2011 °[employer]® Patient Information ©2014 [employer]. ° °

## 2014-01-19 ENCOUNTER — Telehealth: Payer: Self-pay | Admitting: *Deleted

## 2014-01-19 NOTE — Telephone Encounter (Signed)
Patricia Andrews called states pt fell on Saturday.  Requesting whether pt should continue original PT or should pt begin specific PT based on fall.  Please advise

## 2014-01-19 NOTE — Telephone Encounter (Signed)
Continue "original" PT for now - PT will make rec to me if they feel pt could benefit from "different" type of PT Thanks!

## 2014-01-20 NOTE — Telephone Encounter (Signed)
Spoke with [name] advising of MDs message

## 2014-01-30 DIAGNOSIS — Z0279 Encounter for issue of other medical certificate: Secondary | ICD-10-CM

## 2014-03-09 ENCOUNTER — Ambulatory Visit: Payer: Self-pay | Admitting: Internal Medicine

## 2014-03-13 ENCOUNTER — Other Ambulatory Visit: Payer: Self-pay | Admitting: Internal Medicine

## 2014-03-22 ENCOUNTER — Ambulatory Visit (INDEPENDENT_AMBULATORY_CARE_PROVIDER_SITE_OTHER): Payer: Medicare Other | Admitting: Podiatry

## 2014-03-22 ENCOUNTER — Encounter: Payer: Self-pay | Admitting: Podiatry

## 2014-03-22 VITALS — BP 135/58 | HR 77

## 2014-03-22 DIAGNOSIS — M79609 Pain in unspecified limb: Secondary | ICD-10-CM

## 2014-03-22 DIAGNOSIS — B351 Tinea unguium: Secondary | ICD-10-CM

## 2014-03-22 DIAGNOSIS — M79606 Pain in leg, unspecified: Secondary | ICD-10-CM

## 2014-03-22 NOTE — Patient Instructions (Signed)
Seen for hypertrophic nails. All nails debrided. Return in 3 months or as needed.  

## 2014-03-22 NOTE — Progress Notes (Signed)
Subjective:  80 year old female presents requesting toe nails and calluses trimmed. Long nails been bothering her.  Wears AFO on left foot and leg, and ambulating well using a cane.   Objective:  Hypertrophic nails x 10.  Corn on 5th right.  Plantar callus on 2nd MPJ right.  Left foot pedal pulses are not palpable, right side is normal with palpable pedal pulses.   Assessment:  Dystrophic nails x 10.  Painful feet with corns and toe nails.  Status post stroke with paralysis on left side arm and leg.   Plan:  All nails, corns and calluses debrided.  Return in 3 months or as needed.

## 2014-04-05 ENCOUNTER — Telehealth: Payer: Self-pay | Admitting: *Deleted

## 2014-04-05 DIAGNOSIS — R21 Rash and other nonspecific skin eruption: Secondary | ICD-10-CM

## 2014-04-05 NOTE — Telephone Encounter (Signed)
Pt stated she has broken out into another rash, this time it has gotten on her face. Wanting md to refer her to see dermatologist. Inform pt md has already left for today will be tomorrow before I give her a call back with md response...Raechel Chute/lmb

## 2014-04-06 NOTE — Telephone Encounter (Signed)
Notified pt md ok referral will be contact once appt has been set-up...Patricia Andrews/lmb

## 2014-04-06 NOTE — Telephone Encounter (Signed)
Derm refer done 

## 2014-05-31 ENCOUNTER — Other Ambulatory Visit: Payer: Self-pay

## 2014-05-31 ENCOUNTER — Telehealth: Payer: Self-pay

## 2014-05-31 MED ORDER — CITALOPRAM HYDROBROMIDE 20 MG PO TABS: 20.0000 mg | ORAL_TABLET | Freq: Every day | ORAL | Status: DC

## 2014-05-31 MED ORDER — METFORMIN HCL 500 MG PO TABS: 500.0000 mg | ORAL_TABLET | Freq: Two times a day (BID) | ORAL | Status: DC

## 2014-05-31 MED ORDER — CLOPIDOGREL BISULFATE 75 MG PO TABS: 75.0000 mg | ORAL_TABLET | Freq: Every day | ORAL | Status: DC

## 2014-05-31 MED ORDER — OLMESARTAN MEDOXOMIL 20 MG PO TABS: ORAL_TABLET | ORAL | Status: DC

## 2014-05-31 MED ORDER — AMLODIPINE BESYLATE 5 MG PO TABS: 5.0000 mg | ORAL_TABLET | Freq: Every day | ORAL | Status: DC

## 2014-05-31 NOTE — Telephone Encounter (Signed)
Refill request for clopidogrel, citalopram, metformin, benicar and amlodipine, all refilled and sent to pharmacy

## 2014-05-31 NOTE — Telephone Encounter (Signed)
Received refill requests for metformin, amlodipine, rx refilled and sent to pharmacy

## 2014-06-01 ENCOUNTER — Ambulatory Visit: Payer: Self-pay | Admitting: Internal Medicine

## 2014-06-22 ENCOUNTER — Ambulatory Visit: Payer: Medicare Other | Admitting: Internal Medicine

## 2014-06-22 ENCOUNTER — Encounter: Payer: Self-pay | Admitting: Podiatry

## 2014-06-22 ENCOUNTER — Ambulatory Visit (INDEPENDENT_AMBULATORY_CARE_PROVIDER_SITE_OTHER): Payer: Medicare Other | Admitting: Podiatry

## 2014-06-22 VITALS — BP 166/70 | HR 78

## 2014-06-22 DIAGNOSIS — M79606 Pain in leg, unspecified: Secondary | ICD-10-CM

## 2014-06-22 DIAGNOSIS — B351 Tinea unguium: Secondary | ICD-10-CM

## 2014-06-22 NOTE — Patient Instructions (Signed)
Seen for hypertrophic nails. All nails debrided. Return in 3 months or as needed.  

## 2014-06-22 NOTE — Progress Notes (Signed)
Subjective:  80 year old female presents requesting toe nails and calluses trimmed. Long nails been bothering her.  Wears AFO on left foot and leg, and ambulating well using a cane.   Objective:  Hypertrophic nails x 10.  Plantar callus on left great toe and 2nd MPJ right.  Left foot pedal pulses are not palpable, right side is normal with palpable pedal pulses.   Assessment:  Dystrophic nails x 10.  Painful feet with corns and toe nails.  Status post stroke with paralysis on left side arm and leg.   Plan:  All nails, corns and calluses debrided.  Return in 3 months or as needed.

## 2014-06-30 ENCOUNTER — Other Ambulatory Visit: Payer: Self-pay | Admitting: Internal Medicine

## 2014-09-16 ENCOUNTER — Ambulatory Visit: Payer: Medicare Other | Admitting: Internal Medicine

## 2014-09-22 ENCOUNTER — Ambulatory Visit: Payer: Medicare Other | Admitting: Podiatry

## 2014-10-02 DIAGNOSIS — H5213 Myopia, bilateral: Secondary | ICD-10-CM | POA: Diagnosis not present

## 2014-10-12 ENCOUNTER — Encounter: Payer: Self-pay | Admitting: Podiatry

## 2014-10-12 ENCOUNTER — Ambulatory Visit (INDEPENDENT_AMBULATORY_CARE_PROVIDER_SITE_OTHER): Payer: Medicare Other | Admitting: Podiatry

## 2014-10-12 VITALS — BP 133/62 | HR 80

## 2014-10-12 DIAGNOSIS — B351 Tinea unguium: Secondary | ICD-10-CM | POA: Diagnosis not present

## 2014-10-12 DIAGNOSIS — M79606 Pain in leg, unspecified: Secondary | ICD-10-CM

## 2014-10-12 NOTE — Patient Instructions (Signed)
Seen for hypertrophic nails. All nails debrided. Return in 3 months or as needed.  

## 2014-10-12 NOTE — Progress Notes (Signed)
Subjective:  81 year old female presents requesting toe nails and calluses trimmed. Long nails been bothering her.  No new problems. Wears AFO on left foot and leg, and ambulating well using a cane.   Objective:  Hypertrophic nails x 10.  Plantar callus on left great toe and 2nd MPJ right.  Left foot pedal pulses are not palpable, right side is normal with palpable pedal pulses.   Assessment:  Dystrophic nails x 10.  Painful feet with corns and toe nails.  Status post stroke with paralysis on left side arm and leg.   Plan:  All nails, corns and calluses debrided.  Return in 3 months or as needed. 

## 2014-11-08 ENCOUNTER — Telehealth: Payer: Self-pay

## 2014-11-08 NOTE — Telephone Encounter (Signed)
The patient stated she had a flu shot in the fall of 2015 but was not sure where.

## 2014-11-15 ENCOUNTER — Telehealth: Payer: Self-pay | Admitting: Internal Medicine

## 2014-11-15 DIAGNOSIS — E119 Type 2 diabetes mellitus without complications: Secondary | ICD-10-CM | POA: Diagnosis not present

## 2014-11-15 DIAGNOSIS — M810 Age-related osteoporosis without current pathological fracture: Secondary | ICD-10-CM

## 2014-11-15 NOTE — Telephone Encounter (Signed)
States mother only wants to see Leschber.  Scheduled patient for August.  Daughter would like to have labs done and a bone injection?

## 2014-11-16 ENCOUNTER — Telehealth: Payer: Self-pay | Admitting: *Deleted

## 2014-11-16 NOTE — Telephone Encounter (Signed)
Notified daughter with md response.../lmb 

## 2014-11-16 NOTE — Telephone Encounter (Signed)
Spoke with pt daughter she is overdue for her prolia injection. Inform Jerrye BeaversHazel will give her a call back once we her back concerning Insurance verification. Forwarding msg to Rose to [employer]...Raechel Chute/lmb

## 2014-11-16 NOTE — Telephone Encounter (Signed)
Called daughter to clarify msg. Daughter states mom appt is not until 8/15, but she hasn't had any labs done since last visit. Wanting to see if md can order labs just to see how she is doing. Also she is overdue for prolia. Inform Jerrye BeaversHazel will send msg to ladies that check insurance & give her a call bck with that info & make nurse visit for prolia. Pls advise if labs can be done...Raechel Chute/lmb

## 2014-11-16 NOTE — Telephone Encounter (Signed)
Labs entered - please come fasting Agree with prolia plans Thanks!

## 2014-11-18 NOTE — Telephone Encounter (Signed)
I have electronically sent pt's info for [employer] insurance verification and will notify you once I have a response. Thank you. °

## 2014-11-23 ENCOUNTER — Other Ambulatory Visit: Payer: Self-pay | Admitting: Internal Medicine

## 2014-11-26 NOTE — Telephone Encounter (Signed)
I have rec'd pt's insurance verification for Prolia.  W/out an office visit pt's estimated responsibility is a 20% co-insurance, which is approximately $180; if an OV is billed pt will have a $35 co-pay plus 20% co-insurance for a total estimated pt responsibility of $215.  Please make pt aware this is an estimated and we will not know an exact amt until her insurance pays.  I have sent a copy of the summary of benefits to be scanned into pt's chart.  If Ms. Greggory StallionGeorge cannot afford [phone] for her injection, please advise her to contact [employer] at [phone] to see if she qualifies for one of their financial assistance programs.  If she qualifies they will instruct her how to proceed.  If you have any questions, please let me know. Thank you.

## 2014-11-26 NOTE — Telephone Encounter (Signed)
Notified pt/daughter (Patricia Andrews)n with prolia status made mom nurse visit for 12/02/14. Inform [place] to order prolia...Raechel Chute/lmb

## 2014-12-02 ENCOUNTER — Other Ambulatory Visit (INDEPENDENT_AMBULATORY_CARE_PROVIDER_SITE_OTHER): Payer: Medicare Other

## 2014-12-02 ENCOUNTER — Ambulatory Visit (INDEPENDENT_AMBULATORY_CARE_PROVIDER_SITE_OTHER): Payer: Medicare Other

## 2014-12-02 DIAGNOSIS — E119 Type 2 diabetes mellitus without complications: Secondary | ICD-10-CM | POA: Diagnosis not present

## 2014-12-02 DIAGNOSIS — M810 Age-related osteoporosis without current pathological fracture: Secondary | ICD-10-CM

## 2014-12-02 LAB — LIPID PANEL
CHOL/HDL RATIO: 3
Cholesterol: 213 mg/dL — ABNORMAL HIGH (ref 0–200)
HDL: 84.1 mg/dL (ref >39.00)
LDL CALC: 117 mg/dL — AB (ref 0–99)
NONHDL: 128.9
Triglycerides: 62 mg/dL (ref 0.0–149.0)
VLDL: 12.4 mg/dL (ref 0.0–40.0)

## 2014-12-02 LAB — BASIC METABOLIC PANEL
BUN: 18 mg/dL (ref 6–23)
CALCIUM: 9.5 mg/dL (ref 8.4–10.5)
CO2: 26 mEq/L (ref 19–32)
Chloride: 103 mEq/L (ref 96–112)
Creatinine, Ser: 1.31 mg/dL — ABNORMAL HIGH (ref 0.40–1.20)
GFR: 49.41 mL/min — ABNORMAL LOW (ref >60.00)
Glucose, Bld: 147 mg/dL — ABNORMAL HIGH (ref 70–99)
Potassium: 4.8 mEq/L (ref 3.5–5.1)
SODIUM: 137 meq/L (ref 135–145)

## 2014-12-02 LAB — MICROALBUMIN / CREATININE URINE RATIO
Creatinine,U: 153.7 mg/dL
MICROALB UR: 2.6 mg/dL — AB (ref 0.0–1.9)
Microalb Creat Ratio: 1.7 mg/g (ref 0.0–30.0)

## 2014-12-02 LAB — HEMOGLOBIN A1C: Hgb A1c MFr Bld: 6.3 % (ref 4.6–6.5)

## 2014-12-02 MED ORDER — DENOSUMAB 60 MG/ML ~~LOC~~ SOLN
60.0000 mg | Freq: Once | SUBCUTANEOUS | Status: AC
  Administered 2014-12-02: 60 mg via SUBCUTANEOUS

## 2014-12-23 ENCOUNTER — Other Ambulatory Visit: Payer: Self-pay | Admitting: Internal Medicine

## 2015-01-11 ENCOUNTER — Ambulatory Visit (INDEPENDENT_AMBULATORY_CARE_PROVIDER_SITE_OTHER): Payer: Medicare Other | Admitting: Podiatry

## 2015-01-11 ENCOUNTER — Encounter: Payer: Self-pay | Admitting: Podiatry

## 2015-01-11 VITALS — BP 142/92 | HR 81

## 2015-01-11 DIAGNOSIS — B351 Tinea unguium: Secondary | ICD-10-CM | POA: Diagnosis not present

## 2015-01-11 DIAGNOSIS — M79606 Pain in leg, unspecified: Secondary | ICD-10-CM | POA: Diagnosis not present

## 2015-01-11 NOTE — Patient Instructions (Signed)
Seen for hypertrophic nails. All nails debrided. Return in 3 months or as needed.  

## 2015-01-11 NOTE — Progress Notes (Signed)
Subjective:  81 year old female presents requesting toe nails and calluses trimmed. Long nails been bothering her.  No new problems. Wears AFO on left foot and leg, and ambulating well using a cane.   Objective:  Hypertrophic nails x 10.  Plantar callus on left great toe and 2nd MPJ right.  Left foot pedal pulses are not palpable, right side is normal with palpable pedal pulses.   Assessment:  Dystrophic nails x 10.  Painful feet with corns and toe nails.  Status post stroke with paralysis on left side arm and leg.   Plan:  All nails, corns and calluses debrided.  Return in 3 months or as needed.

## 2015-02-17 DIAGNOSIS — E119 Type 2 diabetes mellitus without complications: Secondary | ICD-10-CM | POA: Diagnosis not present

## 2015-02-25 LAB — HM DIABETES EYE EXAM

## 2015-03-10 ENCOUNTER — Other Ambulatory Visit: Payer: Self-pay | Admitting: Internal Medicine

## 2015-03-30 ENCOUNTER — Telehealth: Payer: Self-pay | Admitting: Internal Medicine

## 2015-03-30 NOTE — Telephone Encounter (Signed)
Can the Prolia shot wait until her appt in Nov

## 2015-03-30 NOTE — Telephone Encounter (Signed)
It is completely up to the patient. Recommendation is for every six months.

## 2015-03-30 NOTE — Telephone Encounter (Signed)
We can due a nurse visit for Prolia (not due until 05/2015). Pt will need to have insurance reverified after that appt.

## 2015-03-30 NOTE — Telephone Encounter (Signed)
Informed patient via vm

## 2015-03-30 NOTE — Telephone Encounter (Signed)
I called pts daughter, Patricia Andrews, to reschedule her appointment on 8/15. I wasn't able to schedule her until 11/16. She states patient will not see another provider. She is wondering if she is going to need any lab work before then and if she will be past due for her prolia shot. I will be keeping an eye out for a cancellation for her.  Please advise

## 2015-04-11 ENCOUNTER — Ambulatory Visit: Payer: Self-pay | Admitting: Internal Medicine

## 2015-04-12 ENCOUNTER — Ambulatory Visit: Payer: Self-pay | Admitting: Podiatry

## 2015-04-13 ENCOUNTER — Ambulatory Visit (INDEPENDENT_AMBULATORY_CARE_PROVIDER_SITE_OTHER): Payer: Medicare Other | Admitting: Internal Medicine

## 2015-04-13 ENCOUNTER — Ambulatory Visit: Payer: Self-pay | Admitting: Podiatry

## 2015-04-13 ENCOUNTER — Encounter: Payer: Self-pay | Admitting: Podiatry

## 2015-04-13 ENCOUNTER — Ambulatory Visit (INDEPENDENT_AMBULATORY_CARE_PROVIDER_SITE_OTHER): Payer: Medicare Other | Admitting: Podiatry

## 2015-04-13 ENCOUNTER — Encounter: Payer: Self-pay | Admitting: Internal Medicine

## 2015-04-13 ENCOUNTER — Other Ambulatory Visit (INDEPENDENT_AMBULATORY_CARE_PROVIDER_SITE_OTHER): Payer: Medicare Other

## 2015-04-13 VITALS — BP 129/59 | HR 79

## 2015-04-13 VITALS — BP 116/50 | HR 76 | Temp 98.0°F | Ht 62.0 in | Wt 117.5 lb

## 2015-04-13 DIAGNOSIS — E119 Type 2 diabetes mellitus without complications: Secondary | ICD-10-CM

## 2015-04-13 DIAGNOSIS — E785 Hyperlipidemia, unspecified: Secondary | ICD-10-CM | POA: Diagnosis not present

## 2015-04-13 DIAGNOSIS — M79676 Pain in unspecified toe(s): Secondary | ICD-10-CM

## 2015-04-13 DIAGNOSIS — I10 Essential (primary) hypertension: Secondary | ICD-10-CM

## 2015-04-13 DIAGNOSIS — B351 Tinea unguium: Secondary | ICD-10-CM | POA: Diagnosis not present

## 2015-04-13 DIAGNOSIS — Z23 Encounter for immunization: Secondary | ICD-10-CM | POA: Diagnosis not present

## 2015-04-13 DIAGNOSIS — F418 Other specified anxiety disorders: Secondary | ICD-10-CM | POA: Diagnosis not present

## 2015-04-13 DIAGNOSIS — Z0000 Encounter for general adult medical examination without abnormal findings: Secondary | ICD-10-CM

## 2015-04-13 DIAGNOSIS — M79606 Pain in leg, unspecified: Secondary | ICD-10-CM | POA: Diagnosis not present

## 2015-04-13 DIAGNOSIS — I69359 Hemiplegia and hemiparesis following cerebral infarction affecting unspecified side: Secondary | ICD-10-CM

## 2015-04-13 LAB — MICROALBUMIN / CREATININE URINE RATIO
Creatinine,U: 296.5 mg/dL
MICROALB UR: 3.1 mg/dL — AB (ref 0.0–1.9)
Microalb Creat Ratio: 1 mg/g (ref 0.0–30.0)

## 2015-04-13 LAB — HEMOGLOBIN A1C: Hgb A1c MFr Bld: 6 % (ref 4.6–6.5)

## 2015-04-13 LAB — HM DIABETES FOOT EXAM

## 2015-04-13 MED ORDER — AMLODIPINE BESYLATE 5 MG PO TABS: 5.0000 mg | ORAL_TABLET | Freq: Every day | ORAL | Status: DC

## 2015-04-13 MED ORDER — GLIMEPIRIDE 2 MG PO TABS: 2.0000 mg | ORAL_TABLET | Freq: Every day | ORAL | Status: DC

## 2015-04-13 MED ORDER — CYCLOBENZAPRINE HCL 5 MG PO TABS: 5.0000 mg | ORAL_TABLET | Freq: Two times a day (BID) | ORAL | Status: DC | PRN

## 2015-04-13 MED ORDER — ZOLPIDEM TARTRATE 5 MG PO TABS: 5.0000 mg | ORAL_TABLET | Freq: Every evening | ORAL | Status: DC | PRN

## 2015-04-13 MED ORDER — MIRTAZAPINE 15 MG PO TABS: 15.0000 mg | ORAL_TABLET | Freq: Every day | ORAL | Status: DC

## 2015-04-13 MED ORDER — SIMVASTATIN 20 MG PO TABS: 20.0000 mg | ORAL_TABLET | Freq: Every day | ORAL | Status: DC

## 2015-04-13 MED ORDER — CLOPIDOGREL BISULFATE 75 MG PO TABS: 75.0000 mg | ORAL_TABLET | Freq: Every day | ORAL | Status: DC

## 2015-04-13 MED ORDER — OLMESARTAN MEDOXOMIL 20 MG PO TABS: 20.0000 mg | ORAL_TABLET | Freq: Every day | ORAL | Status: DC

## 2015-04-13 MED ORDER — METFORMIN HCL 500 MG PO TABS: 500.0000 mg | ORAL_TABLET | Freq: Two times a day (BID) | ORAL | Status: DC

## 2015-04-13 NOTE — Assessment & Plan Note (Signed)
On statin. No adverse complaints with medication treatment. Check labs annually and titrate as needed especially with prior CVA and other co-morbid risk factors  

## 2015-04-13 NOTE — Assessment & Plan Note (Signed)
New diagnosis late 2012 following TIA. Reviewed historically progressive symptoms of social withdrawal and dysphoria prior to initiation of SSRI. Following with psychology services and counseling. Initially much improved with SSRI medication -Also added Remeron qhs for side effects of sedation (to use in place of Ambien if possible) and increase appetite to help weight loss -increase dose 11/2012 with improved symptoms. Unintentionally, abruptly discontinued both July 2016 due to no refills. After discussion with patient and daughter today, we'll resume mirtazapine alone and hold Celexa at this time to see if symptoms can be controlled with fewer meds. Patient will call if Celexa also needed if "ill mood" unimproved in next 2 weeks on solo Remeron

## 2015-04-13 NOTE — Patient Instructions (Signed)
Seen for hypertrophic nails. All nails debrided. Return in 3 months or as needed.  

## 2015-04-13 NOTE — Assessment & Plan Note (Signed)
Remote CVA with left . Hemiparesis, medical management ongoing. No aspirin given ongoing Plavix and history of anemia with GI loss No recurrent hospitalization since TIA in fall 2012. No residual symptoms from most current event, persisting chronic left hemiparesis. Continue lower ext brace support as per orthopedics, Dr. Leslee Home Last neuro note for same reviewed.

## 2015-04-13 NOTE — Assessment & Plan Note (Signed)
On Amaryl and metformin Previously AM hypoglycemia - reduced amaryl 11/2012. On ARB, statin - annual eval with optho and foot exam Check A1c now and titrate medications as needed  Lab Results  Component Value Date   HGBA1C 6.3 12/02/2014

## 2015-04-13 NOTE — Progress Notes (Signed)
Subjective:    Patient ID: Patricia Andrews, female    DOB: 1925/09/25, 81 y.o.   MRN: [id number]  HPI   Here for medicare wellness  Diet: heart healthy, diabetic Physical activity: sedentary Depression/mood screen: negative - but "ill" (per dtr) since off "nice pills" last 30d due to no refills Hearing: intact to whispered voice Visual acuity: grossly normal, performs annual eye exam  ADLs: capable Fall risk: none Home safety: good Cognitive evaluation: intact to orientation, naming, recall and repetition EOL planning: adv directives, full code/ I agree  I have personally reviewed and have noted 1. The patient's medical and social history 2. Their use of alcohol, tobacco or illicit drugs 3. Their current medications and supplements 4. The patient's functional ability including ADL's, fall risks, home safety risks and hearing or visual impairment. 5. Diet and physical activities 6. Evidence for depression or mood disorders  Also reviewed chronic medical issues and interval events and current concerns   Past Medical History  Diagnosis Date  . Stroke 1979    R MCA>>residual spastic L HP; TIA 06/2011  . Diabetes mellitus, type 2   . Hyperlipidemia   . Hypertension   . Arthritis   . Depression     anxiety overlap  . Allergic rhinitis   . Anemia   . Osteoporosis, senile     accidental fall w/ pelvic fx 2006, L wrist fx 10/2013  . Pelvis fracture    Family History  Problem Relation Age of Onset  . Arthritis Mother   . Arthritis Father    Social History  Substance Use Topics  . Smoking status: Never Smoker   . Smokeless tobacco: Never Used  . Alcohol Use: No    Review of Systems  Constitutional: Negative for fatigue and unexpected weight change.  Respiratory: Negative for cough, shortness of breath and wheezing.   Cardiovascular: Negative for chest pain, palpitations and leg swelling.  Gastrointestinal: Negative for nausea, abdominal pain and diarrhea.    Neurological: Negative for dizziness, weakness, light-headedness and headaches.  Psychiatric/Behavioral: Negative for dysphoric mood. The patient is nervous/anxious.   All other systems reviewed and are negative.   Patient Care Team: Newt Lukes, MD as PCP - General (Internal Medicine) Milas Gain, MD (Neurology) Myeong Christianne Dolin, DPM (Podiatry) Louis Meckel, MD (Gastroenterology)     Objective:    Physical Exam  Constitutional: She appears well-developed and well-nourished. No distress.  Cardiovascular: Normal rate, regular rhythm and normal heart sounds.   No murmur heard. Pulmonary/Chest: Effort normal and breath sounds normal. No respiratory distress.  Musculoskeletal: She exhibits no edema.  Neurological:  Chronic, significant left hemiparesis impairment, unchanged. Walks with left foot lift brace and cane    BP 116/50 mmHg  Pulse 76  Temp(Src) 98 F (36.7 C) (Oral)  Ht  (1.575 m)  Wt 117 lb 8 oz (53.298 kg)  BMI 21.49 kg/m2  SpO2 96% Wt Readings from Last 3 Encounters:  04/13/15 117 lb 8 oz (53.298 kg)  01/16/14 127 lb (57.607 kg)  12/22/13 127 lb 6.4 oz (57.788 kg)    Lab Results  Component Value Date   WBC 4.4* 04/21/2013   HGB 11.6* 01/17/2014   HCT 34.0* 01/17/2014   PLT 217.0 04/21/2013   GLUCOSE 147* 12/02/2014   CHOL 213* 12/02/2014   TRIG 62.0 12/02/2014   HDL 84.10 12/02/2014   LDLDIRECT 108.6 04/21/2013   LDLCALC 117* 12/02/2014   ALT 6 06/05/2012   AST 13 06/05/2012  NA 137 12/02/2014   K 4.8 12/02/2014   CL 103 12/02/2014   CREATININE 1.31* 12/02/2014   BUN 18 12/02/2014   CO2 26 12/02/2014   TSH 2.57 04/21/2012   INR 0.93 06/05/2012   HGBA1C 6.3 12/02/2014   MICROALBUR 2.6* 12/02/2014    Dg Lumbar Spine Complete  01/17/2014   CLINICAL DATA:  Larey Seat yesterday.  Tailbone region pain.  EXAM: LUMBAR SPINE - COMPLETE 4+ VIEW  COMPARISON:  None.  FINDINGS: No fracture. There is a curvature, convex to the right in the  lower lumbar spine and to the left at the thoracolumbar junction. The bones are demineralized. No bone lesions.  Schmorl's nodes are noted along the upper endplates L1 and L2. Disc spaces are relatively well maintained. There are facet degenerative changes in the lower lumbar spine. Calcification is noted along a normal caliber abdominal aorta.  IMPRESSION: No fracture or acute finding.   Electronically Signed   By: Amie Portland M.D.   On: 01/17/2014 16:45   Dg Pelvis 1-2 Views  01/17/2014   CLINICAL DATA:  Larey Seat yesterday.  Tailbone region pain.  EXAM: PELVIS - 1-2 VIEW  COMPARISON:  07/08/2006  FINDINGS: No fracture. No dislocation. There is mild axial hip joint space narrowing on the right. SI joints and symphysis pubis are normally space and aligned as is the left hip joint. Bones are diffusely demineralized. Soft tissues are unremarkable.  IMPRESSION: No fracture or acute finding.   Electronically Signed   By: Amie Portland M.D.   On: 01/17/2014 16:46   Dg Sacrum/coccyx  01/17/2014   CLINICAL DATA:  Larey Seat yesterday.  Tailbone pain.  EXAM: SACRUM AND COCCYX - 2+ VIEW  COMPARISON:  None.  FINDINGS: No convincing fracture. No bone lesion. Bones are demineralized. SI joints are normally aligned. Soft tissues are unremarkable.  IMPRESSION: No fracture or acute finding.   Electronically Signed   By: Amie Portland M.D.   On: 01/17/2014 16:42       Assessment & Plan:   AWV/z00.00 - Today patient counseled on age appropriate routine health concerns for screening and prevention, each reviewed and up to date or declined. Immunizations reviewed and up to date or declined. Labs ordered and reviewed. Risk factors for depression reviewed and negative. Hearing function and visual acuity are intact. ADLs screened and addressed as needed. Functional ability and level of safety reviewed and appropriate. Education, counseling and referrals performed based on assessed risks today. Patient provided with a copy of  personalized plan for preventive services.  Problem List Items Addressed This Visit    CVA, old, hemiparesis    Remote CVA with left . Hemiparesis, medical management ongoing. No aspirin given ongoing Plavix and history of anemia with GI loss No recurrent hospitalization since TIA in fall 2012. No residual symptoms from most current event, persisting chronic left hemiparesis. Continue lower ext brace support as per orthopedics, Dr. Leslee Home Last neuro note for same reviewed.      Depression with anxiety    New diagnosis late 2012 following TIA. Reviewed historically progressive symptoms of social withdrawal and dysphoria prior to initiation of SSRI. Following with psychology services and counseling. Initially much improved with SSRI medication -Also added Remeron qhs for side effects of sedation (to use in place of Ambien if possible) and increase appetite to help weight loss -increase dose 11/2012 with improved symptoms. Unintentionally, abruptly discontinued both July 2016 due to no refills. After discussion with patient and daughter today, we'll resume mirtazapine alone  and hold Celexa at this time to see if symptoms can be controlled with fewer meds. Patient will call if Celexa also needed if "ill mood" unimproved in next 2 weeks on solo Remeron       Diabetes type 2, controlled (Chronic)    On Amaryl and metformin Previously AM hypoglycemia - reduced amaryl 11/2012. On ARB, statin - annual eval with optho and foot exam Check A1c now and titrate medications as needed  Lab Results  Component Value Date   HGBA1C 6.3 12/02/2014        Relevant Medications   glimepiride (AMARYL) 2 MG tablet   metFORMIN (GLUCOPHAGE) 500 MG tablet   simvastatin (ZOCOR) 20 MG tablet   olmesartan (BENICAR) 20 MG tablet   Other Relevant Orders   Hemoglobin A1c   Microalbumin / creatinine urine ratio   Hyperlipidemia    On statin. No adverse complaints with medication treatment. Check labs annually  and titrate as needed especially with prior CVA and other co-morbid risk factors      Relevant Medications   amLODipine (NORVASC) 5 MG tablet   simvastatin (ZOCOR) 20 MG tablet   olmesartan (BENICAR) 20 MG tablet   Hypertension    BP Readings from Last 3 Encounters:  04/13/15 116/50  01/11/15 142/92  10/12/14 133/62   The current medical regimen is effective;  continue present plan and medications.       Relevant Medications   amLODipine (NORVASC) 5 MG tablet   simvastatin (ZOCOR) 20 MG tablet   olmesartan (BENICAR) 20 MG tablet    Other Visit Diagnoses    Routine general medical examination at a health care facility    -  Primary        Rene Paci, MD

## 2015-04-13 NOTE — Assessment & Plan Note (Signed)
BP Readings from Last 3 Encounters:  04/13/15 116/50  01/11/15 142/92  10/12/14 133/62   The current medical regimen is effective;  continue present plan and medications.

## 2015-04-13 NOTE — Progress Notes (Signed)
Pre visit review using our clinic review tool, if applicable. No additional management support is needed unless otherwise documented below in the visit note. 

## 2015-04-13 NOTE — Patient Instructions (Signed)
It was good to see you today.  We have reviewed your prior records including labs and tests today  Health Maintenance reviewed and immunizations updated today -  all recommended age-appropriate screenings are up-to-date.  Test(s) ordered today. Your results will be released to MyChart (or called to you) after review, usually within 72hours after test completion. If any changes need to be made, you will be notified at that same time.  Medications reviewed and updated Use generic muscle relaxer twice daily as needed for pain - let us know if unimproved or worse pain -  Resume Remeron today and hold on celexa for "nice" pill - if still needing 2nd pill after 2 weeks, call and we will resume celexa too  No other changes recommended at this time.  Please schedule followup in 6 months for semiannual diabetes mellitus check and exam, call sooner if problems.

## 2015-04-13 NOTE — Progress Notes (Signed)
Subjective:  81 year old female presents requesting toe nails and calluses trimmed.  No new problems. Wears AFO on left foot and leg, and ambulating well using a cane.   Objective:  Hypertrophic nails x 10.  Plantar callus on left great toe and 2nd MPJ right.  Left foot pedal pulses are not palpable, right side is normal with palpable pedal pulses.   Assessment:  Dystrophic nails x 10.  Painful feet with corns and toe nails.  Status post stroke with paralysis on left side arm and leg.   Plan:  All nails, corns and calluses debrided.  Return in 3 months or as needed.

## 2015-04-14 ENCOUNTER — Telehealth: Payer: Self-pay

## 2015-04-14 DIAGNOSIS — Z23 Encounter for immunization: Secondary | ICD-10-CM | POA: Diagnosis not present

## 2015-04-14 DIAGNOSIS — E119 Type 2 diabetes mellitus without complications: Secondary | ICD-10-CM | POA: Diagnosis not present

## 2015-04-14 DIAGNOSIS — Z0000 Encounter for general adult medical examination without abnormal findings: Secondary | ICD-10-CM | POA: Diagnosis not present

## 2015-04-14 NOTE — Telephone Encounter (Signed)
Left message with pt dtr. Need to have mother follow up appointment scheduled per PCP.

## 2015-04-14 NOTE — Addendum Note (Signed)
Addended by: Tonye Becket on: 04/14/2015 09:35 AM   Modules accepted: Orders, SmartSet

## 2015-05-14 ENCOUNTER — Encounter (HOSPITAL_COMMUNITY): Payer: Self-pay | Admitting: Emergency Medicine

## 2015-05-14 ENCOUNTER — Observation Stay (HOSPITAL_COMMUNITY)
Admission: EM | Admit: 2015-05-14 | Discharge: 2015-05-16 | Disposition: A | Discharge status: Skilled Nursing Facility | Payer: Medicare Other | Attending: Internal Medicine | Admitting: Internal Medicine

## 2015-05-14 ENCOUNTER — Emergency Department (HOSPITAL_COMMUNITY): Payer: Medicare Other

## 2015-05-14 DIAGNOSIS — Z791 Long term (current) use of non-steroidal anti-inflammatories (NSAID): Secondary | ICD-10-CM | POA: Diagnosis not present

## 2015-05-14 DIAGNOSIS — E785 Hyperlipidemia, unspecified: Secondary | ICD-10-CM | POA: Diagnosis not present

## 2015-05-14 DIAGNOSIS — N183 Chronic kidney disease, stage 3 (moderate): Secondary | ICD-10-CM | POA: Insufficient documentation

## 2015-05-14 DIAGNOSIS — F329 Major depressive disorder, single episode, unspecified: Secondary | ICD-10-CM | POA: Insufficient documentation

## 2015-05-14 DIAGNOSIS — I129 Hypertensive chronic kidney disease with stage 1 through stage 4 chronic kidney disease, or unspecified chronic kidney disease: Secondary | ICD-10-CM | POA: Insufficient documentation

## 2015-05-14 DIAGNOSIS — S52501A Unspecified fracture of the lower end of right radius, initial encounter for closed fracture: Principal | ICD-10-CM

## 2015-05-14 DIAGNOSIS — S62101A Fracture of unspecified carpal bone, right wrist, initial encounter for closed fracture: Secondary | ICD-10-CM

## 2015-05-14 DIAGNOSIS — I69354 Hemiplegia and hemiparesis following cerebral infarction affecting left non-dominant side: Secondary | ICD-10-CM

## 2015-05-14 DIAGNOSIS — I69359 Hemiplegia and hemiparesis following cerebral infarction affecting unspecified side: Secondary | ICD-10-CM | POA: Diagnosis not present

## 2015-05-14 DIAGNOSIS — E119 Type 2 diabetes mellitus without complications: Secondary | ICD-10-CM

## 2015-05-14 DIAGNOSIS — Z23 Encounter for immunization: Secondary | ICD-10-CM | POA: Insufficient documentation

## 2015-05-14 DIAGNOSIS — Z7902 Long term (current) use of antithrombotics/antiplatelets: Secondary | ICD-10-CM | POA: Insufficient documentation

## 2015-05-14 DIAGNOSIS — Z79899 Other long term (current) drug therapy: Secondary | ICD-10-CM | POA: Diagnosis not present

## 2015-05-14 DIAGNOSIS — M1990 Unspecified osteoarthritis, unspecified site: Secondary | ICD-10-CM | POA: Diagnosis not present

## 2015-05-14 DIAGNOSIS — I10 Essential (primary) hypertension: Secondary | ICD-10-CM | POA: Diagnosis present

## 2015-05-14 DIAGNOSIS — W1839XA Other fall on same level, initial encounter: Secondary | ICD-10-CM | POA: Insufficient documentation

## 2015-05-14 DIAGNOSIS — S52509A Unspecified fracture of the lower end of unspecified radius, initial encounter for closed fracture: Secondary | ICD-10-CM | POA: Insufficient documentation

## 2015-05-14 DIAGNOSIS — E1159 Type 2 diabetes mellitus with other circulatory complications: Secondary | ICD-10-CM | POA: Diagnosis present

## 2015-05-14 DIAGNOSIS — E1169 Type 2 diabetes mellitus with other specified complication: Secondary | ICD-10-CM | POA: Diagnosis present

## 2015-05-14 DIAGNOSIS — M25531 Pain in right wrist: Secondary | ICD-10-CM | POA: Diagnosis present

## 2015-05-14 HISTORY — DX: Fracture of unspecified carpal bone, right wrist, initial encounter for closed fracture: S62.101A

## 2015-05-14 LAB — CBC WITH DIFFERENTIAL/PLATELET
BASOS ABS: 0 10*3/uL (ref 0.0–0.1)
BASOS PCT: 0 %
EOS PCT: 0 %
Eosinophils Absolute: 0 10*3/uL (ref 0.0–0.7)
HCT: 30.6 % — ABNORMAL LOW (ref 36.0–46.0)
Hemoglobin: 9.8 g/dL — ABNORMAL LOW (ref 12.0–15.0)
Lymphocytes Relative: 23 %
Lymphs Abs: 1.8 10*3/uL (ref 0.7–4.0)
MCH: 29.9 pg (ref 26.0–34.0)
MCHC: 32 g/dL (ref 30.0–36.0)
MCV: 93.3 fL (ref 78.0–100.0)
MONO ABS: 0.5 10*3/uL (ref 0.1–1.0)
Monocytes Relative: 6 %
Neutro Abs: 5.7 10*3/uL (ref 1.7–7.7)
Neutrophils Relative %: 71 %
PLATELETS: 260 10*3/uL (ref 150–400)
RBC: 3.28 MIL/uL — ABNORMAL LOW (ref 3.87–5.11)
RDW: 13.6 % (ref 11.5–15.5)
WBC: 8.1 10*3/uL (ref 4.0–10.5)

## 2015-05-14 LAB — CBG MONITORING, ED: GLUCOSE-CAPILLARY: 139 mg/dL — AB (ref 65–99)

## 2015-05-14 LAB — BASIC METABOLIC PANEL
ANION GAP: 8 (ref 5–15)
BUN: 20 mg/dL (ref 6–20)
CO2: 23 mmol/L (ref 22–32)
CREATININE: 1.33 mg/dL — AB (ref 0.44–1.00)
Calcium: 9.4 mg/dL (ref 8.9–10.3)
Chloride: 106 mmol/L (ref 101–111)
GFR calc Af Amer: 40 mL/min — ABNORMAL LOW (ref >60)
GFR calc non Af Amer: 35 mL/min — ABNORMAL LOW (ref >60)
GLUCOSE: 156 mg/dL — AB (ref 65–99)
Potassium: 5.2 mmol/L — ABNORMAL HIGH (ref 3.5–5.1)
Sodium: 137 mmol/L (ref 135–145)

## 2015-05-14 MED ORDER — ACETAMINOPHEN 650 MG RE SUPP: 650.0000 mg | Freq: Four times a day (QID) | RECTAL | Status: DC | PRN

## 2015-05-14 MED ORDER — INSULIN ASPART 100 UNIT/ML ~~LOC~~ SOLN
0.0000 [IU] | Freq: Three times a day (TID) | SUBCUTANEOUS | Status: DC
  Administered 2015-05-15: 3 [IU] via SUBCUTANEOUS
  Administered 2015-05-15: 1 [IU] via SUBCUTANEOUS
  Administered 2015-05-16: 3 [IU] via SUBCUTANEOUS
  Administered 2015-05-16: 1 [IU] via SUBCUTANEOUS

## 2015-05-14 MED ORDER — SIMVASTATIN 20 MG PO TABS
20.0000 mg | ORAL_TABLET | Freq: Every day | ORAL | Status: DC
  Administered 2015-05-15 (×2): 20 mg via ORAL
  Filled 2015-05-14 (×3): qty 1

## 2015-05-14 MED ORDER — OXYCODONE HCL 5 MG PO TABS
5.0000 mg | ORAL_TABLET | ORAL | Status: DC | PRN
  Administered 2015-05-15 (×2): 5 mg via ORAL
  Filled 2015-05-14 (×2): qty 1

## 2015-05-14 MED ORDER — SODIUM CHLORIDE 0.9 % IJ SOLN
3.0000 mL | Freq: Two times a day (BID) | INTRAMUSCULAR | Status: DC
  Administered 2015-05-15 – 2015-05-16 (×3): 3 mL via INTRAVENOUS

## 2015-05-14 MED ORDER — SODIUM CHLORIDE 0.9 % IV SOLN: 250.0000 mL | INTRAVENOUS | Status: DC | PRN

## 2015-05-14 MED ORDER — GLIMEPIRIDE 2 MG PO TABS
2.0000 mg | ORAL_TABLET | Freq: Every day | ORAL | Status: DC
  Filled 2015-05-14 (×2): qty 1

## 2015-05-14 MED ORDER — ONDANSETRON HCL 4 MG PO TABS: 4.0000 mg | ORAL_TABLET | Freq: Four times a day (QID) | ORAL | Status: DC | PRN

## 2015-05-14 MED ORDER — MIRTAZAPINE 15 MG PO TABS
15.0000 mg | ORAL_TABLET | Freq: Every day | ORAL | Status: DC
  Administered 2015-05-15 (×2): 15 mg via ORAL
  Filled 2015-05-14 (×3): qty 1

## 2015-05-14 MED ORDER — METFORMIN HCL 500 MG PO TABS
500.0000 mg | ORAL_TABLET | Freq: Two times a day (BID) | ORAL | Status: DC
  Filled 2015-05-14 (×3): qty 1

## 2015-05-14 MED ORDER — IBUPROFEN 200 MG PO TABS
400.0000 mg | ORAL_TABLET | Freq: Once | ORAL | Status: AC
  Administered 2015-05-14: 400 mg via ORAL
  Filled 2015-05-14: qty 2

## 2015-05-14 MED ORDER — INSULIN ASPART 100 UNIT/ML ~~LOC~~ SOLN: 0.0000 [IU] | Freq: Every day | SUBCUTANEOUS | Status: DC

## 2015-05-14 MED ORDER — ONDANSETRON HCL 4 MG/2ML IJ SOLN: 4.0000 mg | Freq: Four times a day (QID) | INTRAMUSCULAR | Status: DC | PRN

## 2015-05-14 MED ORDER — VITAMIN D3 25 MCG (1000 UNIT) PO TABS
1000.0000 [IU] | ORAL_TABLET | Freq: Every day | ORAL | Status: DC
  Administered 2015-05-15 – 2015-05-16 (×2): 1000 [IU] via ORAL
  Filled 2015-05-14 (×2): qty 1

## 2015-05-14 MED ORDER — FENTANYL CITRATE (PF) 100 MCG/2ML IJ SOLN: 50.0000 ug | INTRAMUSCULAR | Status: DC | PRN

## 2015-05-14 MED ORDER — INSULIN ASPART 100 UNIT/ML ~~LOC~~ SOLN: 0.0000 [IU] | Freq: Three times a day (TID) | SUBCUTANEOUS | Status: DC

## 2015-05-14 MED ORDER — ONDANSETRON HCL 4 MG/2ML IJ SOLN
4.0000 mg | Freq: Once | INTRAMUSCULAR | Status: AC
  Administered 2015-05-14: 4 mg via INTRAVENOUS
  Filled 2015-05-14: qty 2

## 2015-05-14 MED ORDER — FERROUS SULFATE 325 (65 FE) MG PO TABS
325.0000 mg | ORAL_TABLET | Freq: Every day | ORAL | Status: DC
  Administered 2015-05-15 – 2015-05-16 (×2): 325 mg via ORAL
  Filled 2015-05-14 (×3): qty 1

## 2015-05-14 MED ORDER — IRBESARTAN 150 MG PO TABS
150.0000 mg | ORAL_TABLET | Freq: Every day | ORAL | Status: DC
  Administered 2015-05-15: 150 mg via ORAL
  Filled 2015-05-14 (×2): qty 1

## 2015-05-14 MED ORDER — INSULIN ASPART 100 UNIT/ML ~~LOC~~ SOLN: 0.0000 [IU] | SUBCUTANEOUS | Status: DC

## 2015-05-14 MED ORDER — ACETAMINOPHEN 325 MG PO TABS
650.0000 mg | ORAL_TABLET | Freq: Four times a day (QID) | ORAL | Status: DC | PRN
  Administered 2015-05-15: 650 mg via ORAL
  Filled 2015-05-14: qty 2

## 2015-05-14 MED ORDER — HYDROMORPHONE HCL 1 MG/ML IJ SOLN: 0.5000 mg | INTRAMUSCULAR | Status: DC | PRN

## 2015-05-14 MED ORDER — CLOPIDOGREL BISULFATE 75 MG PO TABS
75.0000 mg | ORAL_TABLET | Freq: Every day | ORAL | Status: DC
  Administered 2015-05-15 – 2015-05-16 (×2): 75 mg via ORAL
  Filled 2015-05-14 (×3): qty 1

## 2015-05-14 MED ORDER — AMLODIPINE BESYLATE 5 MG PO TABS
5.0000 mg | ORAL_TABLET | Freq: Every day | ORAL | Status: DC
  Administered 2015-05-15 – 2015-05-16 (×2): 5 mg via ORAL
  Filled 2015-05-14 (×2): qty 1

## 2015-05-14 MED ORDER — TRAMADOL HCL 50 MG PO TABS: 50.0000 mg | ORAL_TABLET | Freq: Four times a day (QID) | ORAL | Status: DC | PRN

## 2015-05-14 MED ORDER — ALUM & MAG HYDROXIDE-SIMETH 200-200-20 MG/5ML PO SUSP: 30.0000 mL | Freq: Four times a day (QID) | ORAL | Status: DC | PRN

## 2015-05-14 MED ORDER — MORPHINE SULFATE (PF) 2 MG/ML IV SOLN
2.0000 mg | Freq: Once | INTRAVENOUS | Status: AC
  Administered 2015-05-14: 2 mg via INTRAVENOUS
  Filled 2015-05-14: qty 1

## 2015-05-14 MED ORDER — CITALOPRAM HYDROBROMIDE 20 MG PO TABS
20.0000 mg | ORAL_TABLET | Freq: Every day | ORAL | Status: DC
  Administered 2015-05-15 – 2015-05-16 (×2): 20 mg via ORAL
  Filled 2015-05-14 (×2): qty 1

## 2015-05-14 MED ORDER — BISACODYL 5 MG PO TBEC: 5.0000 mg | DELAYED_RELEASE_TABLET | Freq: Every day | ORAL | Status: DC | PRN

## 2015-05-14 MED ORDER — ENOXAPARIN SODIUM 30 MG/0.3ML ~~LOC~~ SOLN
30.0000 mg | SUBCUTANEOUS | Status: DC
  Administered 2015-05-15 – 2015-05-16 (×2): 30 mg via SUBCUTANEOUS
  Filled 2015-05-14 (×2): qty 0.3

## 2015-05-14 MED ORDER — OMEGA-3-ACID ETHYL ESTERS 1 G PO CAPS
1.0000 g | ORAL_CAPSULE | Freq: Every day | ORAL | Status: DC
  Administered 2015-05-15 – 2015-05-16 (×2): 1 g via ORAL
  Filled 2015-05-14 (×2): qty 1

## 2015-05-14 MED ORDER — SODIUM CHLORIDE 0.9 % IJ SOLN: 3.0000 mL | INTRAMUSCULAR | Status: DC | PRN

## 2015-05-14 NOTE — Discharge Instructions (Signed)
Radial Fracture °You have a broken bone (fracture) of the forearm. This is the part of your arm between the elbow and your wrist. Your forearm is made up of two bones. These are the radius and ulna. Your fracture is in the radial shaft. This is the bone in your forearm located on the thumb side. A cast or splint is used to protect and keep your injured bone from moving. The cast or splint will be on generally for about 5 to 6 weeks, with individual variations. °HOME CARE INSTRUCTIONS  °· Keep the injured part elevated while sitting or lying down. Keep the injury above the level of your heart (the center of the chest). This will decrease swelling and pain. °· Apply ice to the injury for 15-20 minutes, 03-04 times per day while awake, for 2 days. Put the ice in a plastic bag and place a towel between the bag of ice and your cast or splint. °· Move your fingers to avoid stiffness and minimize swelling. °· If you have a plaster or fiberglass cast: °¨ Do not try to scratch the skin under the cast using sharp or pointed objects. °¨ Check the skin around the cast every day. You may put lotion on any red or sore areas. °¨ Keep your cast dry and clean. °· If you have a plaster splint: °¨ Wear the splint as directed. °¨ You may loosen the elastic around the splint if your fingers become numb, tingle, or turn cold or blue. °¨ Do not put pressure on any part of your cast or splint. It may break. Rest your cast only on a pillow for the first 24 hours until it is fully hardened. °· Your cast or splint can be protected during bathing with a plastic bag. Do not lower the cast or splint into water. °· Only take over-the-counter or prescription medicines for pain, discomfort, or fever as directed by your caregiver. °SEEK IMMEDIATE MEDICAL CARE IF:  °· Your cast gets damaged or breaks. °· You have more severe pain or swelling than you did before getting the cast. °· You have severe pain when stretching your fingers. °· There is a bad  smell, new stains and/or pus-like (purulent) drainage coming from under the cast. °· Your fingers or hand turn pale or blue and become cold or your loose feeling. °Document Released: 01/24/2006 Document Revised: 11/05/2011 Document Reviewed: 04/22/2006 °ExitCare® Patient Information ©2015 [employer]. This information is not intended to replace advice given to you by your health care provider. Make sure you discuss any questions you have with your health care provider. ° °

## 2015-05-14 NOTE — ED Notes (Signed)
Pt reports losing her balance and falling today on her right wrist. Left sided paralysis d/t previous stroke. No new neurological deficits. Denies dizziness specifically and any other symptoms prior to the fall. Swelling noted to right wrist. No other c/c. Denies pain in any other location.

## 2015-05-14 NOTE — ED Notes (Signed)
May start IV on foot per Dr. Lovell Sheehan.

## 2015-05-14 NOTE — H&P (Addendum)
[facility] Admission History and Physical       ADRIANAH PROPHETE NWG:[id number] DOB: 1925-10-26 DOA: 05/14/2015  Referring physician: EDP PCP: Rene Paci, MD  Specialists:   Chief Complaint: Right Wrist Pain after Falling  HPI: Patricia Andrews is a 81 y.o. female with a history of a CVA with Left Hemiparesis, HTN,  DM2 Hyperlipidemia who presents to ED after losing her balance in her home and falling onto her right arm.    She denies any LOC.   She was brought to the ED and found to have a Right Wrist Fracture.   She walks with a cane and is unable to walk.   She lives alone.    She has 5/10 pain at this time .    She has been referred for Observation.       Review of Systems: Constitutional: No Weight Loss, No Weight Gain, Night Sweats, Fevers, Chills, Dizziness, Light Headedness, Fatigue, or Generalized Weakness HEENT: No Headaches, Difficulty Swallowing,Tooth/Dental Problems,Sore Throat,  No Sneezing, Rhinitis, Ear Ache, Nasal Congestion, or Post Nasal Drip,  Cardio-vascular:  No Chest pain, Orthopnea, PND, Edema in Lower Extremities, Anasarca, Dizziness, Palpitations  Resp: No Dyspnea, No DOE, No Productive Cough, No Non-Productive Cough, No Hemoptysis, No Wheezing.    GI: No Heartburn, Indigestion, Abdominal Pain, Nausea, Vomiting, Diarrhea, Constipation, Hematemesis, Hematochezia, Melena, Change in Bowel Habits,  Loss of Appetite  GU: No Dysuria, No Change in Color of Urine, No Urgency or Urinary Frequency, No Flank pain.  Musculoskeletal:  Right Wrist Pain  No Back Pain.  Neurologic: No Syncope, No Seizures, Muscle Weakness, Paresthesia, Vision Disturbance or Loss, No Diplopia, No Vertigo, No Difficulty Walking,  Skin: No Rash or Lesions. Psych: No Change in Mood or Affect, No Depression or Anxiety, No Memory loss, No Confusion, or Hallucinations   Past Medical History  Diagnosis Date  . Stroke 1979    R MCA>>residual spastic L HP; TIA 06/2011  . Diabetes  mellitus, type 2   . Hyperlipidemia   . Hypertension   . Arthritis   . Depression     anxiety overlap  . Allergic rhinitis   . Anemia   . Osteoporosis, senile     accidental fall w/ pelvic fx 2006, L wrist fx 10/2013  . Pelvis fracture      Past Surgical History  Procedure Laterality Date  . Abdominal hysterectomy  1960    Fibroid tumors  . Colonoscopy  06/06/2012    Procedure: COLONOSCOPY;  Surgeon: Louis Meckel, MD;  Location: [facility];  Service: Endoscopy;  Laterality: N/A;  Rm 1501. Pt to be discharged after procedure. Patient was only admitted for prep.      Prior to Admission medications   Medication Sig Start Date End Date Taking? Authorizing Provider  amLODipine (NORVASC) 5 MG tablet Take 1 tablet (5 mg total) by mouth daily. 04/13/15  Yes Newt Lukes, MD  cholecalciferol (VITAMIN D) 1000 UNITS tablet Take 1,000 Units by mouth daily.   Yes Historical Provider, MD  citalopram (CELEXA) 20 MG tablet Take 1 tablet (20 mg total) by mouth daily. 05/31/14  Yes Newt Lukes, MD  clopidogrel (PLAVIX) 75 MG tablet Take 1 tablet (75 mg total) by mouth daily with breakfast. 04/13/15  Yes Newt Lukes, MD  denosumab (PROLIA) 60 MG/ML SOLN injection Inject 60 mg into the skin every 6 (six) months. Administer in upper arm, thigh, or abdomen   Yes Historical Provider, MD  ferrous sulfate 325 (65  FE) MG tablet Take 325 mg by mouth daily with breakfast.   Yes Historical Provider, MD  glimepiride (AMARYL) 2 MG tablet Take 1 tablet (2 mg total) by mouth daily with breakfast. 04/13/15  Yes Newt Lukes, MD  metFORMIN (GLUCOPHAGE) 500 MG tablet Take 1 tablet (500 mg total) by mouth 2 (two) times daily with a meal. 04/13/15  Yes Newt Lukes, MD  mirtazapine (REMERON) 15 MG tablet Take 1 tablet (15 mg total) by mouth at bedtime. 04/13/15  Yes Newt Lukes, MD  olmesartan (BENICAR) 20 MG tablet Take 1 tablet (20 mg total) by mouth daily. 04/13/15  Yes Newt Lukes, MD  Omega-3 Fatty Acids (FISH OIL PO) Take 1 capsule by mouth every morning.   Yes Historical Provider, MD  simvastatin (ZOCOR) 20 MG tablet Take 1 tablet (20 mg total) by mouth at bedtime. 04/13/15  Yes Newt Lukes, MD  zolpidem (AMBIEN) 5 MG tablet Take 1 tablet (5 mg total) by mouth at bedtime as needed. For sleep. 04/13/15  Yes Newt Lukes, MD  bisacodyl (DULCOLAX) 5 MG EC tablet Take 5 mg by mouth daily as needed. For constipation.    Historical Provider, MD  cyclobenzaprine (FLEXERIL) 5 MG tablet Take 1 tablet (5 mg total) by mouth 2 (two) times daily as needed for muscle spasms. Patient not taking: Reported on 05/14/2015 04/13/15   Newt Lukes, MD  naproxen sodium (ANAPROX) 220 MG tablet Take 220 mg by mouth daily as needed (pain).    Historical Provider, MD  traMADol (ULTRAM) 50 MG tablet Take 1 tablet (50 mg total) by mouth every 6 (six) hours as needed. 05/14/15   Gilda Crease, MD     No Known Allergies  Social History:  reports that she has never smoked. She has never used smokeless tobacco. She reports that she does not drink alcohol or use illicit drugs.    Family History  Problem Relation Age of Onset  . Arthritis Mother   . Arthritis Father        Physical Exam:  GEN:  Pleasant Thin Elderly  81 y.o. African American female examined and in no acute distress; cooperative with exam Filed Vitals:   05/14/15 1634 05/14/15 1727 05/14/15 2054  BP: 150/69  160/83  Pulse: 87  90  Temp: 98.1 F (36.7 C)  98.3 F (36.8 C)  TempSrc: Oral  Oral  Resp: 17  18  SpO2: 98% 98% 98%   Blood pressure 160/83, pulse 90, temperature 98.3 F (36.8 C), temperature source Oral, resp. rate 18, SpO2 98 %. PSYCH: She is alert and oriented x4; does not appear anxious does not appear depressed; affect is normal HEENT: Normocephalic and Atraumatic, Mucous membranes pink; PERRLA; EOM intact; Fundi:  Benign;  No scleral icterus, Nares: Patent, Oropharynx:  Clear,Fair Dentition,    Neck:  FROM, No Cervical Lymphadenopathy nor Thyromegaly or Carotid Bruit; No JVD; Breasts:: Not examined CHEST WALL: No tenderness CHEST: Normal respiration, clear to auscultation bilaterally HEART: Regular rate and rhythm; no murmurs rubs or gallops BACK: No kyphosis or scoliosis; No CVA tenderness ABDOMEN: Positive Bowel Sounds, Scaphoid, Soft Non-Tender, No Rebound or Guarding; No Masses, No Organomegaly. Rectal Exam: Not done EXTREMITIES: Right UE in Cast.  No Cyanosis, Clubbing, or Edema; No Ulcerations. Genitalia: not examined PULSES: 2+ and symmetric SKIN: Normal hydration no rash or ulceration CNS:  Alert and Oriented x 4, Chronic Left Hemiparesis    Vascular: pulses palpable throughout  Labs on Admission:  Basic Metabolic Panel: No results for input(s): NA, K, CL, CO2, GLUCOSE, BUN, CREATININE, CALCIUM, MG, PHOS in the last 168 hours. Liver Function Tests: No results for input(s): AST, ALT, ALKPHOS, BILITOT, PROT, ALBUMIN in the last 168 hours. No results for input(s): LIPASE, AMYLASE in the last 168 hours. No results for input(s): AMMONIA in the last 168 hours. CBC: No results for input(s): WBC, NEUTROABS, HGB, HCT, MCV, PLT in the last 168 hours. Cardiac Enzymes: No results for input(s): CKTOTAL, CKMB, CKMBINDEX, TROPONINI in the last 168 hours.  BNP (last 3 results) No results for input(s): BNP in the last 8760 hours.  ProBNP (last 3 results) No results for input(s): PROBNP in the last 8760 hours.  CBG: No results for input(s): GLUCAP in the last 168 hours.  Radiological Exams on Admission: Dg Wrist Complete Right  05/14/2015   CLINICAL DATA:  Pt reports losing her balance, while trying to catch a falling bowl, and fell today on her right wrist. Swelling and pain noted to right distal radius/navicular area. Denies pain in hand.  EXAM: RIGHT WRIST - COMPLETE 3+ VIEW  COMPARISON:  None.  FINDINGS: There is a transverse fracture of the  distal radial metaphysis. There is mild fracture comminution along the dorsal margin of the fracture. There is mild dorsal impaction of the fracture leading to dorsal angulation of the distal radial articular surface of approximately 10 degrees.  There are no other fractures. There is no dislocation. Bones are demineralized. There is diffuse surrounding soft tissue swelling.  IMPRESSION: Transverse fracture of the distal right radius with mild dorsal impaction and comminution.   Electronically Signed   By: Amie Portland M.D.   On: 05/14/2015 16:57   Dg Hand Complete Right  05/14/2015   CLINICAL DATA:  Pt reports losing her balance, while trying to catch a falling bowl, and fell today on her right wrist. Swelling and pain noted to right distal radius/navicular area. Denies pain in hand.  EXAM: RIGHT HAND - COMPLETE 3+ VIEW  COMPARISON:  None.  FINDINGS: Fracture of the distal radius. Please refer to the right wrist radiographs for further details.  No other fractures. Mild degenerative changes noted involving several of the interphalangeal joints most prominently the PIP joint of the middle finger. Bones are demineralized. There is right wrist soft tissue swelling.  IMPRESSION: Fracture distal right radius.  No hand fracture.  No dislocation.   Electronically Signed   By: Amie Portland M.D.   On: 05/14/2015 16:53     EKG: Independently reviewed.     Assessment/Plan:     81 y.o. female with  Principal Problem:   1.      Right wrist fracture   Pain Control PRN with IV Dilaudid    Or Oral Oxycodone/APAP PO   Active Problems:   2.     Diabetes type 2, controlled   Continue Amaryl and Metformin Rx   Monitor Glucose Levels   SSI coverage PRN     3.     CVA, old, hemiparesis   Chronic   contiue Plavix Rx     4.     Hypertension   Continue Benicar, Amlodipine Rx   Monitoring BPs     5.     Hyperlipidemia   Continue Simvastatin     6.     DVT Prophylaxis    Lovenox        Code  Status:   Do Not Resuscitate (DNR) Family Communication:  Daughter at Bedside Disposition:   Observation  Status  Expected  Hospital Stay 1-2 days.       Ron Parker [facility] Pager [phone]   If 7AM -7PM Please Contact the Day Rounding Team MD for [facility]  If 7PM-7AM, Please Contact Night-Floor Coverage  [email or web address] Password [facility] 05/14/2015, 9:54 PM     ADDENDUM:   Patient was seen and examined on 05/14/2015

## 2015-05-14 NOTE — ED Notes (Addendum)
May start IV on foot ordered by Dr. Lovell Sheehan

## 2015-05-14 NOTE — ED Provider Notes (Addendum)
CSN: [id number]     Arrival date & time 05/14/15  1625 History   First MD Initiated Contact with Patient 05/14/15 1717     Chief Complaint  Patient presents with  . Fall  . Wrist Pain     (Consider location/radiation/quality/duration/timing/severity/associated sxs/prior Treatment) HPI Comments: Patient presents to the ER for evaluation of right wrist injury. Patient reports that she lost her balance secondary to left hemiparesis from previous stroke. She fell onto her outstretched arm and is complaining of pain of the right wrist. Pain is mild but worsens with movement. She did not hit her head or lose consciousness. No headache. No neck or back pain.  Patient is a 81 y.o. female presenting with fall and wrist pain.  Fall  Wrist Pain    Past Medical History  Diagnosis Date  . Stroke 1979    R MCA>>residual spastic L HP; TIA 06/2011  . Diabetes mellitus, type 2   . Hyperlipidemia   . Hypertension   . Arthritis   . Depression     anxiety overlap  . Allergic rhinitis   . Anemia   . Osteoporosis, senile     accidental fall w/ pelvic fx 2006, L wrist fx 10/2013  . Pelvis fracture    Past Surgical History  Procedure Laterality Date  . Abdominal hysterectomy  1960    Fibroid tumors  . Colonoscopy  06/06/2012    Procedure: COLONOSCOPY;  Surgeon: Louis Meckel, MD;  Location: [facility];  Service: Endoscopy;  Laterality: N/A;  Rm 1501. Pt to be discharged after procedure. Patient was only admitted for prep.   Family History  Problem Relation Age of Onset  . Arthritis Mother   . Arthritis Father    Social History  Substance Use Topics  . Smoking status: Never Smoker   . Smokeless tobacco: Never Used  . Alcohol Use: No   OB History    No data available     Review of Systems  Musculoskeletal: Positive for arthralgias.  All other systems reviewed and are negative.     Allergies  Review of patient's allergies indicates no known allergies.  Home Medications    Prior to Admission medications   Medication Sig Start Date End Date Taking? Authorizing Provider  amLODipine (NORVASC) 5 MG tablet Take 1 tablet (5 mg total) by mouth daily. 04/13/15   Newt Lukes, MD  bisacodyl (DULCOLAX) 5 MG EC tablet Take 5 mg by mouth daily as needed. For constipation.    Historical Provider, MD  cholecalciferol (VITAMIN D) 1000 UNITS tablet Take 1,000 Units by mouth daily.    Historical Provider, MD  citalopram (CELEXA) 20 MG tablet Take 1 tablet (20 mg total) by mouth daily. 05/31/14   Newt Lukes, MD  clopidogrel (PLAVIX) 75 MG tablet Take 1 tablet (75 mg total) by mouth daily with breakfast. 04/13/15   Newt Lukes, MD  cyclobenzaprine (FLEXERIL) 5 MG tablet Take 1 tablet (5 mg total) by mouth 2 (two) times daily as needed for muscle spasms. 04/13/15   Newt Lukes, MD  ferrous sulfate 325 (65 FE) MG tablet Take 325 mg by mouth daily with breakfast.    Historical Provider, MD  glimepiride (AMARYL) 2 MG tablet Take 1 tablet (2 mg total) by mouth daily with breakfast. 04/13/15   Newt Lukes, MD  metFORMIN (GLUCOPHAGE) 500 MG tablet Take 1 tablet (500 mg total) by mouth 2 (two) times daily with a meal. 04/13/15   Newt Lukes,  MD  mirtazapine (REMERON) 15 MG tablet Take 1 tablet (15 mg total) by mouth at bedtime. 04/13/15   Newt Lukes, MD  naproxen sodium (ANAPROX) 220 MG tablet Take 220 mg by mouth daily as needed (pain).    Historical Provider, MD  olmesartan (BENICAR) 20 MG tablet Take 1 tablet (20 mg total) by mouth daily. 04/13/15   Newt Lukes, MD  Omega-3 Fatty Acids (FISH OIL PO) Take 1 capsule by mouth every morning.    Historical Provider, MD  simvastatin (ZOCOR) 20 MG tablet Take 1 tablet (20 mg total) by mouth at bedtime. 04/13/15   Newt Lukes, MD  zolpidem (AMBIEN) 5 MG tablet Take 1 tablet (5 mg total) by mouth at bedtime as needed. For sleep. 04/13/15   Newt Lukes, MD   BP 150/69 mmHg  Pulse 87   Temp(Src) 98.1 F (36.7 C) (Oral)  Resp 17  SpO2 98% Physical Exam  Constitutional: She is oriented to person, place, and time. She appears well-developed and well-nourished. No distress.  HENT:  Head: Normocephalic and atraumatic.  Right Ear: Hearing normal.  Left Ear: Hearing normal.  Nose: Nose normal.  Mouth/Throat: Oropharynx is clear and moist and mucous membranes are normal.  Eyes: Conjunctivae and EOM are normal. Pupils are equal, round, and reactive to light.  Neck: Normal range of motion. Neck supple. No spinous process tenderness present.  Cardiovascular: Regular rhythm, S1 normal and S2 normal.  Exam reveals no gallop and no friction rub.   No murmur heard. Pulmonary/Chest: Effort normal and breath sounds normal. No respiratory distress. She exhibits no tenderness.  Abdominal: Soft. Normal appearance and bowel sounds are normal. There is no hepatosplenomegaly. There is no tenderness. There is no rebound, no guarding, no tenderness at McBurney's point and negative Murphy's sign. No hernia.  Musculoskeletal:       Right wrist: She exhibits decreased range of motion, tenderness and swelling.       Cervical back: Normal.       Thoracic back: Normal.       Lumbar back: Normal.  Neurological: She is alert and oriented to person, place, and time. She has normal strength. No cranial nerve deficit or sensory deficit. Coordination normal. GCS eye subscore is 4. GCS verbal subscore is 5. GCS motor subscore is 6.  Skin: Skin is warm, dry and intact. No rash noted. No cyanosis.  Psychiatric: She has a normal mood and affect. Her speech is normal and behavior is normal. Thought content normal.  Nursing note and vitals reviewed.   ED Course  Procedures (including critical care time) Labs Review Labs Reviewed - No data to display  Imaging Review Dg Wrist Complete Right  05/14/2015   CLINICAL DATA:  Pt reports losing her balance, while trying to catch a falling bowl, and fell today on  her right wrist. Swelling and pain noted to right distal radius/navicular area. Denies pain in hand.  EXAM: RIGHT WRIST - COMPLETE 3+ VIEW  COMPARISON:  None.  FINDINGS: There is a transverse fracture of the distal radial metaphysis. There is mild fracture comminution along the dorsal margin of the fracture. There is mild dorsal impaction of the fracture leading to dorsal angulation of the distal radial articular surface of approximately 10 degrees.  There are no other fractures. There is no dislocation. Bones are demineralized. There is diffuse surrounding soft tissue swelling.  IMPRESSION: Transverse fracture of the distal right radius with mild dorsal impaction and comminution.   Electronically Signed  By: Amie Portland M.D.   On: 05/14/2015 16:57   Dg Hand Complete Right  05/14/2015   CLINICAL DATA:  Pt reports losing her balance, while trying to catch a falling bowl, and fell today on her right wrist. Swelling and pain noted to right distal radius/navicular area. Denies pain in hand.  EXAM: RIGHT HAND - COMPLETE 3+ VIEW  COMPARISON:  None.  FINDINGS: Fracture of the distal radius. Please refer to the right wrist radiographs for further details.  No other fractures. Mild degenerative changes noted involving several of the interphalangeal joints most prominently the PIP joint of the middle finger. Bones are demineralized. There is right wrist soft tissue swelling.  IMPRESSION: Fracture distal right radius.  No hand fracture.  No dislocation.   Electronically Signed   By: Amie Portland M.D.   On: 05/14/2015 16:53   I have personally reviewed and evaluated these images and lab results as part of my medical decision-making.   EKG Interpretation None      MDM   Final diagnoses:  None  Distal Radius Fracture  Patient presents to the ER for evaluation after a fall. Patient has isolated wrist pain because of fall on outstretched hand. X-ray confirms fracture. Fracture is closed. Patient is having  minimal pain. Patient has seen Dr. Melvyn Novas in the past.   Patient concerned about being discharged because she has left hemiparesis and now is in a cast on her right arm. She cannot get around on her own. She does not have any family members that can help her. Her living arrangements are difficult for her, as she has to climb stairs to get into her home. There is no one here from a social services or case management standpoint to help with her home environment. Patient will therefore be placed in the hospital for observation for further consideration of home health and equipment.  Discussed briefly with Dr. Shon Baton, on call for the [facility] orthopedics. Patient will be seen in consultation tomorrow.   Gilda Crease, MD 05/14/15 1801  Gilda Crease, MD 05/14/15 2122

## 2015-05-14 NOTE — ED Notes (Signed)
Pt reported rt wrist pain s/p to tripping and falling. Noted deformity, (+)PMS, CRT brisk, no bruising, slight swelling noted. Applied cold pack. Pt tolerated well.

## 2015-05-14 NOTE — ED Notes (Signed)
Ortho tech at bedside applying splint and sling. Pt tolerated well.

## 2015-05-15 ENCOUNTER — Encounter (HOSPITAL_COMMUNITY): Payer: Self-pay | Admitting: *Deleted

## 2015-05-15 DIAGNOSIS — E119 Type 2 diabetes mellitus without complications: Secondary | ICD-10-CM | POA: Diagnosis not present

## 2015-05-15 DIAGNOSIS — S62101K Fracture of unspecified carpal bone, right wrist, subsequent encounter for fracture with nonunion: Secondary | ICD-10-CM | POA: Diagnosis not present

## 2015-05-15 DIAGNOSIS — S52501A Unspecified fracture of the lower end of right radius, initial encounter for closed fracture: Secondary | ICD-10-CM | POA: Diagnosis not present

## 2015-05-15 DIAGNOSIS — I10 Essential (primary) hypertension: Secondary | ICD-10-CM

## 2015-05-15 DIAGNOSIS — E785 Hyperlipidemia, unspecified: Secondary | ICD-10-CM | POA: Diagnosis not present

## 2015-05-15 DIAGNOSIS — I69359 Hemiplegia and hemiparesis following cerebral infarction affecting unspecified side: Secondary | ICD-10-CM | POA: Diagnosis not present

## 2015-05-15 LAB — CBC
HCT: 29.5 % — ABNORMAL LOW (ref 36.0–46.0)
HEMOGLOBIN: 9.3 g/dL — AB (ref 12.0–15.0)
MCH: 29.3 pg (ref 26.0–34.0)
MCHC: 31.5 g/dL (ref 30.0–36.0)
MCV: 93.1 fL (ref 78.0–100.0)
Platelets: 176 10*3/uL (ref 150–400)
RBC: 3.17 MIL/uL — AB (ref 3.87–5.11)
RDW: 13.4 % (ref 11.5–15.5)
WBC: 4.7 10*3/uL (ref 4.0–10.5)

## 2015-05-15 LAB — BASIC METABOLIC PANEL
ANION GAP: 8 (ref 5–15)
BUN: 16 mg/dL (ref 6–20)
CHLORIDE: 107 mmol/L (ref 101–111)
CO2: 24 mmol/L (ref 22–32)
Calcium: 9 mg/dL (ref 8.9–10.3)
Creatinine, Ser: 1.29 mg/dL — ABNORMAL HIGH (ref 0.44–1.00)
GFR calc non Af Amer: 36 mL/min — ABNORMAL LOW (ref >60)
GFR, EST AFRICAN AMERICAN: 42 mL/min — AB (ref >60)
GLUCOSE: 123 mg/dL — AB (ref 65–99)
Potassium: 4.9 mmol/L (ref 3.5–5.1)
Sodium: 139 mmol/L (ref 135–145)

## 2015-05-15 LAB — GLUCOSE, CAPILLARY
GLUCOSE-CAPILLARY: 136 mg/dL — AB (ref 65–99)
GLUCOSE-CAPILLARY: 119 mg/dL — AB (ref 65–99)
Glucose-Capillary: 167 mg/dL — ABNORMAL HIGH (ref 65–99)
Glucose-Capillary: 154 mg/dL — ABNORMAL HIGH (ref 65–99)

## 2015-05-15 MED ORDER — INFLUENZA VAC SPLIT QUAD 0.5 ML IM SUSY
0.5000 mL | PREFILLED_SYRINGE | INTRAMUSCULAR | Status: AC
  Administered 2015-05-16: 0.5 mL via INTRAMUSCULAR
  Filled 2015-05-15 (×2): qty 0.5

## 2015-05-15 MED ORDER — ZOLPIDEM TARTRATE 5 MG PO TABS: 5.0000 mg | ORAL_TABLET | Freq: Every evening | ORAL | Status: DC | PRN

## 2015-05-15 MED ORDER — ZOLPIDEM TARTRATE 5 MG PO TABS: 5.0000 mg | ORAL_TABLET | Freq: Once | ORAL | Status: DC

## 2015-05-15 MED ORDER — ENSURE ENLIVE PO LIQD
237.0000 mL | Freq: Two times a day (BID) | ORAL | Status: DC
  Administered 2015-05-15 – 2015-05-16 (×3): 237 mL via ORAL

## 2015-05-15 NOTE — Progress Notes (Signed)
[facility] PROGRESS NOTE  ASUNA PETH WUJ:[id number] DOB: [birth year] DOA: 05/14/2015 PCP: Rene Paci, MD  Assessment/Plan: 1-R distal radius fracture Pain management.  Ortho consulted and following.   2-Diabetes type 2, controlled Hold Amaryl and Metformin while inpatient.  SSI coverage PRN  3-CVA, old, hemiparesis contiue Plavix .  4-Hypertension Continue Benicar, Amlodipine Rx. Follow K level and renal function.   5-Hyperlipidemia Continue Simvastatin.  5-CKD stage III, Cr prior records, 1.3    Code Status: DNR Family Communication: care discussed with patient Disposition Plan: Remain inpatient, need Rehab. SW, CM consulted.    Consultants:  ortho  Procedures:  none  Antibiotics:  none  HPI/Subjective: She is feeling ok, pain controled. Live independent facility.   Objective: Filed Vitals:   05/15/15 0502  BP: 160/76  Pulse: 80  Temp: 98.3 F (36.8 C)  Resp: 16    Intake/Output Summary (Last 24 hours) at 05/15/15 0930 Last data filed at 05/15/15 0900  Gross per 24 hour  Intake    363 ml  Output      0 ml  Net    363 ml   Filed Weights   05/15/15 0100  Weight: 53.2 kg (117 lb 4.6 oz)    Exam:   General:  NAD  Cardiovascular: S 1, S 2 RRR  Respiratory: CTA  Abdomen: BS present, soft  Musculoskeletal: no edema. Left arm with contracture, hemiparesis.   Data Reviewed: Basic Metabolic Panel:  Recent Labs Lab 05/14/15 2201 05/15/15 0523  NA 137 139  K 5.2* 4.9  CL 106 107  CO2 23 24  GLUCOSE 156* 123*  BUN 20 16  CREATININE 1.33* 1.29*  CALCIUM 9.4 9.0   Liver Function Tests: No results for input(s): AST, ALT, ALKPHOS, BILITOT, PROT, ALBUMIN in the last 168 hours. No results for input(s): LIPASE, AMYLASE in the last 168 hours. No results for input(s): AMMONIA in the last 168 hours. CBC:  Recent Labs Lab 05/14/15 2201 05/15/15 0523  WBC 8.1 4.7  NEUTROABS 5.7  --   HGB 9.8* 9.3*  HCT 30.6* 29.5*   MCV 93.3 93.1  PLT 260 176   Cardiac Enzymes: No results for input(s): CKTOTAL, CKMB, CKMBINDEX, TROPONINI in the last 168 hours. BNP (last 3 results) No results for input(s): BNP in the last 8760 hours.  ProBNP (last 3 results) No results for input(s): PROBNP in the last 8760 hours.  CBG:  Recent Labs Lab 05/14/15 2247 05/15/15 0721  GLUCAP 139* 136*    No results found for this or any previous visit (from the past 240 hour(s)).   Studies: Dg Wrist Complete Right  05/14/2015   CLINICAL DATA:  Pt reports losing her balance, while trying to catch a falling bowl, and fell today on her right wrist. Swelling and pain noted to right distal radius/navicular area. Denies pain in hand.  EXAM: RIGHT WRIST - COMPLETE 3+ VIEW  COMPARISON:  None.  FINDINGS: There is a transverse fracture of the distal radial metaphysis. There is mild fracture comminution along the dorsal margin of the fracture. There is mild dorsal impaction of the fracture leading to dorsal angulation of the distal radial articular surface of approximately 10 degrees.  There are no other fractures. There is no dislocation. Bones are demineralized. There is diffuse surrounding soft tissue swelling.  IMPRESSION: Transverse fracture of the distal right radius with mild dorsal impaction and comminution.   Electronically Signed   By: Amie Portland M.D.   On: 05/14/2015 16:57  Dg Hand Complete Right  05/14/2015   CLINICAL DATA:  Pt reports losing her balance, while trying to catch a falling bowl, and fell today on her right wrist. Swelling and pain noted to right distal radius/navicular area. Denies pain in hand.  EXAM: RIGHT HAND - COMPLETE 3+ VIEW  COMPARISON:  None.  FINDINGS: Fracture of the distal radius. Please refer to the right wrist radiographs for further details.  No other fractures. Mild degenerative changes noted involving several of the interphalangeal joints most prominently the PIP joint of the middle finger. Bones are  demineralized. There is right wrist soft tissue swelling.  IMPRESSION: Fracture distal right radius.  No hand fracture.  No dislocation.   Electronically Signed   By: Amie Portland M.D.   On: 05/14/2015 16:53    Scheduled Meds: . amLODipine  5 mg Oral Daily  . cholecalciferol  1,000 Units Oral Daily  . citalopram  20 mg Oral Daily  . clopidogrel  75 mg Oral Q breakfast  . enoxaparin (LOVENOX) injection  30 mg Subcutaneous Q24H  . feeding supplement (ENSURE ENLIVE)  237 mL Oral BID BM  . ferrous sulfate  325 mg Oral Q breakfast  . [START ON 05/16/2015] Influenza vac split quadrivalent PF  0.5 mL Intramuscular Tomorrow-1000  . insulin aspart  0-5 Units Subcutaneous QHS  . insulin aspart  0-9 Units Subcutaneous TID WC  . irbesartan  150 mg Oral Daily  . mirtazapine  15 mg Oral QHS  . omega-3 acid ethyl esters  1 g Oral Daily  . simvastatin  20 mg Oral QHS  . sodium chloride  3 mL Intravenous Q12H   Continuous Infusions:   Principal Problem:   Right wrist fracture Active Problems:   Diabetes type 2, controlled   CVA, old, hemiparesis   Hypertension   Hyperlipidemia    Time spent: 35 minutes.     Hartley Barefoot A  [facility] Pager [phone]. If 7PM-7AM, please contact night-coverage at [email or web address], password [facility] 05/15/2015, 9:30 AM

## 2015-05-15 NOTE — Evaluation (Addendum)
Physical Therapy Evaluation Patient Details Name: Patricia Andrews MRN: [id number] DOB: [birth year] Today's Date: 05/15/2015   History of Present Illness  Patricia Andrews is a 81 y.o. female with a history of a CVA with Left Hemiparesis, HTN, pelvic fx, L wrist fx, DM2,  Hyperlipidemia who presents to ED after losing her balance in her home and falling onto her right arm.  Clinical Impression  Pt admitted with above diagnosis. Pt currently with functional limitations due to the deficits listed below (see PT Problem List). Modified independent with supine to sit. +2 total assist for sliding board transfer (avoided WB to RLE due to IV placement). Pt is motivated and good progress is expected once she can weight bear thru RLE. ST-SNF recommended.  Pt will benefit from skilled PT to increase their independence and safety with mobility to allow discharge to the venue listed below.       Follow Up Recommendations Supervision/Assistance - 24 hour;SNF    Equipment Recommendations  None recommended by PT    Recommendations for Other Services OT consult     Precautions / Restrictions Precautions Precautions: Fall Precaution Comments: pt denies other falls in past year, but has had prior fallls resulting in L wrist fx and pelvic fx several years ago Restrictions Weight Bearing Restrictions: Yes RUE Weight Bearing: Non weight bearing; sling RUE RLE Weight Bearing: Non weight bearing (due to IV, pt is a difficult stick per RN)      Mobility  Bed Mobility Overal bed mobility: Needs Assistance Bed Mobility: Supine to Sit     Supine to sit: HOB elevated;Modified independent (Device/Increase time);Min assist     General bed mobility comments: HOB up 30*, MOd I for supine to sit, min A with pad to scoot to EOB  Transfers Overall transfer level: Needs assistance   Transfers: Lateral/Scoot Transfers          Lateral/Scoot Transfers: With slide board;Total assist;+2 physical  assistance General transfer comment: sliding board transfer due to NWB RLE 2* IV placement and L sided hemiparesis, pt able to maintain sitting balance during transfer   Ambulation/Gait                Stairs            Wheelchair Mobility    Modified Rankin (Stroke Patients Only)       Balance Overall balance assessment: History of Falls Sitting-balance support: No upper extremity supported;Feet supported Sitting balance-Leahy Scale: Good Sitting balance - Comments: pt able to weight shift in sitting                                     Pertinent Vitals/Pain Pain Assessment: No/denies pain    Home Living Family/patient expects to be discharged to:: Skilled nursing facility Living Arrangements: Alone                    Prior Function Level of Independence: Independent with assistive device(s)         Comments: walked with cane PTA, used [employer] for transportation     Hand Dominance   Dominant Hand: Right    Extremity/Trunk Assessment   Upper Extremity Assessment: Defer to OT evaluation           Lower Extremity Assessment: LLE deficits/detail;RLE deficits/detail RLE Deficits / Details: R knee extension +4/5, hip flexion at least 3/5, ankle NT due to IV  LLE Deficits /  Details: L sided hemiparesis from CVA 37 years ago  Cervical / Trunk Assessment: Normal  Communication   Communication: No difficulties  Cognition Arousal/Alertness: Awake/alert Behavior During Therapy: WFL for tasks assessed/performed Overall Cognitive Status: Within Functional Limits for tasks assessed                      General Comments      Exercises        Assessment/Plan    PT Assessment Patient needs continued PT services  PT Diagnosis Difficulty walking;Hemiplegia non-dominant side   PT Problem List Decreased activity tolerance;Decreased mobility  PT Treatment Interventions Functional mobility training;Therapeutic  activities;Therapeutic exercise;Patient/family education   PT Goals (Current goals can be found in the Care Plan section) Acute Rehab PT Goals Patient Stated Goal: to go to church PT Goal Formulation: With patient Time For Goal Achievement: 05/29/15    Frequency Min 3X/week   Barriers to discharge Decreased caregiver support      Co-evaluation PT/OT/SLP Co-Evaluation/Treatment: Yes Reason for Co-Treatment: Complexity of the patient's impairments (multi-system involvement);For patient/therapist safety PT goals addressed during session: Mobility/safety with mobility;Balance         End of Session Equipment Utilized During Treatment: Gait belt Activity Tolerance: Patient tolerated treatment well Patient left: in chair;with call bell/phone within reach (soft touch call bell ordered) Nurse Communication: Mobility status (use of sliding board)    Functional Assessment Tool Used: clinical judgment Functional Limitation: Mobility: Walking and moving around Mobility: Walking and Moving Around Current Status (Z6109): At least 80 percent but less than 100 percent impaired, limited or restricted Mobility: Walking and Moving Around Goal Status [phone]): At least 40 percent but less than 60 percent impaired, limited or restricted    Time: 0949-1022 PT Time Calculation (min) (ACUTE ONLY): 33 min   Charges:   PT Evaluation $Initial PT Evaluation Tier I: 1 Procedure     PT G Codes:   PT G-Codes **NOT FOR INPATIENT CLASS** Functional Assessment Tool Used: clinical judgment Functional Limitation: Mobility: Walking and moving around Mobility: Walking and Moving Around Current Status (U9811): At least 80 percent but less than 100 percent impaired, limited or restricted Mobility: Walking and Moving Around Goal Status [phone]): At least 40 percent but less than 60 percent impaired, limited or restricted    Tamala Ser 05/15/2015, 10:35 AM [phone]

## 2015-05-15 NOTE — Evaluation (Signed)
Occupational Therapy Evaluation Patient Details Name: Patricia Andrews MRN: [id number] DOB: 11-27-1925 Today's Date: 05/15/2015    History of Present Illness Patricia Andrews is a 81 y.o. female with a history of a CVA with Left Hemiparesis, HTN, pelvic fx, L wrist fx, DM2,  Hyperlipidemia who presents to ED after losing her balance in her home and falling onto her right arm.   Clinical Impression   This 81 year old female was admitted for the above.  Her right, only functional hand, is splinted and she is in a sling. She was very limited by mobility as IV was a hard stick and was placed in R anterior ankle so we did not have her weight bear.  Pt is very motivated and positive. She will benefit from skilled OT in acute and follow up OT to maximize participation in ADLs and to prevent edema.    Follow Up Recommendations  SNF    Equipment Recommendations   (to be further assessed, ? drop arm commode vs standard)    Recommendations for Other Services       Precautions / Restrictions Precautions Precautions: Fall Precaution Comments: pt denies other falls in past year, but has had prior fallls resulting in L wrist fx and pelvic fx several years ago Required Braces or Orthoses: Sling Restrictions Weight Bearing Restrictions: Yes RUE Weight Bearing: Non weight bearing RLE Weight Bearing: Non weight bearing (due to IV--pt is a difficult stick)      Mobility Bed Mobility performed by PT Overal bed mobility: Needs Assistance Bed Mobility: Supine to Sit     Supine to sit: HOB elevated;Modified independent (Device/Increase time);Min assist     General bed mobility comments: HOB up 30*, MOd I for supine to sit, min A with pad to scoot to EOB  Transfers Overall transfer level: Needs assistance   Transfers: Lateral/Scoot Transfers          Lateral/Scoot Transfers: With slide board;Total assist;+2 physical assistance General transfer comment: sliding board transfer due to NWB  RLE 2* IV placement and L sided hemiparesis, pt able to maintain sitting balance during transfer     Balance Overall balance assessment: History of Falls Sitting-balance support: No upper extremity supported;Feet supported Sitting balance-Leahy Scale: Good Sitting balance - Comments: pt able to weight shift in sitting                                    ADL Overall ADL's : Needs assistance/impaired                         Toilet Transfer: Total assistance;+2 for physical assistance (with sliding board to drop arm recliner; utilized chux pad)             General ADL Comments: pt needs total A for all ADLs except drinking with weighted cup and straw once positioned for her.  Will further assess if she can self-feed (need to clarify if sling can be removed and if she has available range and use of built up foam.  This is dominant hand, but pt needs to keep splint clean and dry).  Pt needs +1 total assist for UB adls and +2 total assist for LB adls for safety, sit and lean or rolling.      Vision     Perception     Praxis      Pertinent Vitals/Pain Pain  Assessment: No/denies pain     Hand Dominance Right   Extremity/Trunk Assessment Upper Extremity Assessment Upper Extremity Assessment: RUE deficits/detail;LUE deficits/detail RUE Deficits / Details: pt is in a sling and splinted. Able to wiggle fingers.  Lifts arm when talking:  NWB due to fx LUE Deficits / Details: non functional, hemplegia      Cervical / Trunk Assessment Cervical / Trunk Assessment: Normal   Communication Communication Communication: No difficulties   Cognition Arousal/Alertness: Awake/alert Behavior During Therapy: WFL for tasks assessed/performed Overall Cognitive Status: Within Functional Limits for tasks assessed                     General Comments       Exercises       Shoulder Instructions      Home Living Family/patient expects to be discharged  to:: Skilled nursing facility Living Arrangements: Alone                                      Prior Functioning/Environment Level of Independence: Independent with assistive device(s)        Comments: walked with cane PTA, used SCAT for transportation    OT Diagnosis: Generalized weakness   OT Problem List: Decreased strength;Decreased activity tolerance;Impaired UE functional use;Decreased knowledge of use of DME or AE   OT Treatment/Interventions: Self-care/ADL training;DME and/or AE instruction;Patient/family education;Therapeutic activities    OT Goals(Current goals can be found in the care plan section) Acute Rehab OT Goals Patient Stated Goal: to go to church OT Goal Formulation: With patient Time For Goal Achievement: 05/22/15 Potential to Achieve Goals: Good ADL Goals Pt Will Perform Eating: with min assist;sitting;with adaptive utensils (if OK to have sling off) Pt Will Transfer to Toilet: with max assist;squat pivot transfer;with transfer board;bedside commode (drop arm +2 assist) Additional ADL Goal #1: pt will be independent with moving fingers and directing caregiver to prevent edema in R hand  OT Frequency: Min 2X/week   Barriers to D/C:            Co-evaluation PT/OT/SLP Co-Evaluation/Treatment: Yes Reason for Co-Treatment: Complexity of the patient's impairments (multi-system involvement) PT goals addressed during session: Mobility/safety with mobility OT goals addressed during session: ADL's and self-care;Proper use of Adaptive equipment and DME      End of Session Nurse Communication:  (to request soft touch call bell; call PT for assistance )  Activity Tolerance: Patient tolerated treatment well Patient left: in chair   Time: 0454-0981 OT Time Calculation (min): 34 min Charges:  OT General Charges $OT Visit: 1 Procedure OT Evaluation $Initial OT Evaluation Tier I: 1 Procedure G-Codes: OT G-codes **NOT FOR INPATIENT  CLASS** Functional Assessment Tool Used: clinical observation and judgment Functional Limitation: Self care Self Care Current Status (X9147): 100 percent impaired, limited or restricted Self Care Goal Status (W2956): At least 60 percent but less than 80 percent impaired, limited or restricted  [facility] 05/15/2015, 10:52 AM Patricia Andrews Otter, OTR/L [phone] 05/15/2015

## 2015-05-15 NOTE — Clinical Social Work Note (Signed)
Clinical Social Work Assessment  Patient Details  Name: Patricia Andrews MRN: [id number] Date of Birth: August 18, 1926  Date of referral:  05/15/15               Reason for consult:  Facility Placement                Permission sought to share information with:  Facility Sport and exercise psychologist, Family Supports Permission granted to share information::  Yes, Verbal Permission Granted  Name::        Agency::     Relationship::     Contact Information:     Housing/Transportation Living arrangements for the past 2 months:   (independent living but lives in group environment and they clean for her) Source of Information:  Patient, Adult Children Patient Interpreter Needed:    Criminal Activity/Legal Involvement Pertinent to Current Situation/Hospitalization:    Significant Relationships:  Adult Children Lives with:  Self Do you feel safe going back to the place where you live?  No Need for family participation in patient care:  Yes (Comment)  Care giving concerns:  No caregiver   Facilities manager / plan:  CSW met with pt, her daughter, her son in law and grand daughter at bedside to discuss discharge plans.  CSW prompted pt and family to discuss history and current needs.  CSW encouraged pt and family to ask questions. CSW provided information regarding SNf process and encouraged pt and family to discuss feelings and thoughts or any concerns.  CSW provide active listening and will send pt information to SNF's in [place].  CSW left message with [facility], voicemail Diane to let her know that pt would like their facility and to please obtain authorization through [facility]  Employment status:  Retired Forensic scientist:  Managed Care PT Recommendations:  [place] / Referral to community resources:     Patient/Family's Response to care:  Pt and family discussed pt living at an independent living environment with others.  The independent living only provides cleaning  for pt and no other services.  Pt is paralyzed on left side of body.  Cannot use her left arm or leg.  Now patient broke her right arm and it is in a cast so she cannot do anything for herself.  Pt was at [facility] 7 years ago and stated she would like to return. Pt and family hopeful that she can regain use of arm that is in cast.  MD states cast will be on for 6 weeks.  Patient/Family's Understanding of and Emotional Response to Diagnosis, Current Treatment, and Prognosis:  Pt and family understand pt diagnosis and have been told they need to follow up next week with pt's outpatient orthopedic.    Emotional Assessment Appearance:  Appears stated age Attitude/Demeanor/Rapport:   (cooperative) Affect (typically observed):  Accepting, Adaptable Orientation:  Oriented to Self, Oriented to Place, Oriented to  Time, Oriented to Situation Alcohol / Substance use:    Psych involvement (Current and /or in the community):     Discharge Needs  Concerns to be addressed:    Readmission within the last 30 days:    Current discharge risk:    Barriers to Discharge:  No Barriers Identified   Carlean Jews, LCSW 05/15/2015, 3:39 PM

## 2015-05-15 NOTE — Progress Notes (Signed)
Physical Therapy Treatment Patient Details Name: Patricia Andrews MRN: [id number] DOB: 03-27-1926 Today's Date: 05/15/2015    History of Present Illness Patricia Andrews is a 81 y.o. female with a history of a CVA with Left Hemiparesis, HTN, pelvic fx, L wrist fx, DM2,  Hyperlipidemia who presents to ED after losing her balance in her home and falling onto her right arm.    PT Comments    +2 assist for sliding board transfer to bed from recliner. Pt tolerated tx well.   Follow Up Recommendations  Supervision/Assistance - 24 hour;SNF     Equipment Recommendations  None recommended by PT    Recommendations for Other Services OT consult     Precautions / Restrictions Precautions Precautions: Fall Precaution Comments: pt denies other falls in past year, but has had prior fallls resulting in L wrist fx and pelvic fx several years ago Required Braces or Orthoses: Sling Restrictions Weight Bearing Restrictions: Yes RUE Weight Bearing: Non weight bearing RLE Weight Bearing: Non weight bearing (due to IV--pt is a difficult stick)    Mobility  Bed Mobility Overal bed mobility: Needs Assistance Bed Mobility: Sit to Supine     Supine to sit: HOB elevated;Modified independent (Device/Increase time);Min assist Sit to supine: Mod assist;+2 for physical assistance   General bed mobility comments: mod Afor lowering trunk and BLEs into bed  Transfers Overall transfer level: Needs assistance   Transfers: Lateral/Scoot Transfers          Lateral/Scoot Transfers: With slide board;Total assist;+2 physical assistance General transfer comment: sliding board transfer due to NWB RLE 2* IV placement and L sided hemiparesis, pt able to maintain sitting balance during transfer   Ambulation/Gait                 Stairs            Wheelchair Mobility    Modified Rankin (Stroke Patients Only)       Balance Overall balance assessment: History of Falls Sitting-balance  support: No upper extremity supported;Feet supported Sitting balance-Leahy Scale: Good Sitting balance - Comments: pt able to weight shift in sitting                            Cognition Arousal/Alertness: Awake/alert Behavior During Therapy: WFL for tasks assessed/performed Overall Cognitive Status: Within Functional Limits for tasks assessed                      Exercises      General Comments        Pertinent Vitals/Pain Pain Assessment: No/denies pain    Home Living Family/patient expects to be discharged to:: Skilled nursing facility Living Arrangements: Alone                  Prior Function Level of Independence: Independent with assistive device(s)      Comments: walked with cane PTA, used SCAT for transportation   PT Goals (current goals can now be found in the care plan section) Acute Rehab PT Goals Patient Stated Goal: to go to church PT Goal Formulation: With patient Time For Goal Achievement: 05/29/15 Progress towards PT goals: Progressing toward goals    Frequency  Min 3X/week    PT Plan Current plan remains appropriate    Co-evaluation PT/OT/SLP Co-Evaluation/Treatment: Yes Reason for Co-Treatment: Complexity of the patient's impairments (multi-system involvement) PT goals addressed during session: Mobility/safety with mobility OT goals addressed during session: ADL's  and self-care;Proper use of Adaptive equipment and DME     End of Session Equipment Utilized During Treatment: Gait belt Activity Tolerance: Patient tolerated treatment well Patient left: with call bell/phone within reach;in bed;with family/visitor present (soft touch call bell )     Time: 9562-1308 PT Time Calculation (min) (ACUTE ONLY): 11 min  Charges:  $Therapeutic Activity: 8-22 mins                    G Codes:  Functional Assessment Tool Used: clinical judgment Functional Limitation: Mobility: Walking and moving around Mobility: Walking and  Moving Around Current Status (M5784): At least 80 percent but less than 100 percent impaired, limited or restricted Mobility: Walking and Moving Around Goal Status [phone]): At least 40 percent but less than 60 percent impaired, limited or restricted   Tamala Ser 05/15/2015, 2:24 PM [phone]

## 2015-05-15 NOTE — Consult Note (Signed)
ORTHOPAEDIC CONSULTATION  REQUESTING PHYSICIAN: Alba Cory, MD  PCP:  Rene Paci, MD  Chief Complaint: R wrist pain  HPI: Patricia Andrews is a 81 y.o. female who complains of  R wrist pain after fall yesterday . H/O stroke with LEFT hemiparesis. Patient of Dr. Orlan Leavens. [place]ong splint placed. No numbness/tingling. Denies other injuries.  Past Medical History  Diagnosis Date  . Stroke 1979    R MCA>>residual spastic L HP; TIA 06/2011  . Diabetes mellitus, type 2   . Hyperlipidemia   . Hypertension   . Arthritis   . Depression     anxiety overlap  . Allergic rhinitis   . Anemia   . Osteoporosis, senile     accidental fall w/ pelvic fx 2006, L wrist fx 10/2013  . Pelvis fracture    Past Surgical History  Procedure Laterality Date  . Abdominal hysterectomy  1960    Fibroid tumors  . Colonoscopy  06/06/2012    Procedure: COLONOSCOPY;  Surgeon: Louis Meckel, MD;  Location: [facility];  Service: Endoscopy;  Laterality: N/A;  Rm 1501. Pt to be discharged after procedure. Patient was only admitted for prep.   Social History   Social History  . Marital Status: Single    Spouse Name: N/A  . Number of Children: 2  . Years of Education: N/A   Occupational History  . RETIRED    Social History Main Topics  . Smoking status: Never Smoker   . Smokeless tobacco: Never Used  . Alcohol Use: No  . Drug Use: No  . Sexual Activity: No   Other Topics Concern  . None   Social History Narrative   Lives alone in independent living, single. Never married   Supportive daughter [name] -   Family History  Problem Relation Age of Onset  . Arthritis Mother   . Arthritis Father    No Known Allergies Prior to Admission medications   Medication Sig Start Date End Date Taking? Authorizing Provider  amLODipine (NORVASC) 5 MG tablet Take 1 tablet (5 mg total) by mouth daily. 04/13/15  Yes Newt Lukes, MD  cholecalciferol (VITAMIN D) 1000 UNITS tablet Take 1,000  Units by mouth daily.   Yes Historical Provider, MD  citalopram (CELEXA) 20 MG tablet Take 1 tablet (20 mg total) by mouth daily. 05/31/14  Yes Newt Lukes, MD  clopidogrel (PLAVIX) 75 MG tablet Take 1 tablet (75 mg total) by mouth daily with breakfast. 04/13/15  Yes Newt Lukes, MD  denosumab (PROLIA) 60 MG/ML SOLN injection Inject 60 mg into the skin every 6 (six) months. Administer in upper arm, thigh, or abdomen   Yes Historical Provider, MD  ferrous sulfate 325 (65 FE) MG tablet Take 325 mg by mouth daily with breakfast.   Yes Historical Provider, MD  glimepiride (AMARYL) 2 MG tablet Take 1 tablet (2 mg total) by mouth daily with breakfast. 04/13/15  Yes Newt Lukes, MD  metFORMIN (GLUCOPHAGE) 500 MG tablet Take 1 tablet (500 mg total) by mouth 2 (two) times daily with a meal. 04/13/15  Yes Newt Lukes, MD  mirtazapine (REMERON) 15 MG tablet Take 1 tablet (15 mg total) by mouth at bedtime. 04/13/15  Yes Newt Lukes, MD  olmesartan (BENICAR) 20 MG tablet Take 1 tablet (20 mg total) by mouth daily. 04/13/15  Yes Newt Lukes, MD  Omega-3 Fatty Acids (FISH OIL PO) Take 1 capsule by mouth every morning.   Yes Historical Provider,  MD  simvastatin (ZOCOR) 20 MG tablet Take 1 tablet (20 mg total) by mouth at bedtime. 04/13/15  Yes Newt Lukes, MD  zolpidem (AMBIEN) 5 MG tablet Take 1 tablet (5 mg total) by mouth at bedtime as needed. For sleep. 04/13/15  Yes Newt Lukes, MD  bisacodyl (DULCOLAX) 5 MG EC tablet Take 5 mg by mouth daily as needed. For constipation.    Historical Provider, MD  cyclobenzaprine (FLEXERIL) 5 MG tablet Take 1 tablet (5 mg total) by mouth 2 (two) times daily as needed for muscle spasms. Patient not taking: Reported on 05/14/2015 04/13/15   Newt Lukes, MD  naproxen sodium (ANAPROX) 220 MG tablet Take 220 mg by mouth daily as needed (pain).    Historical Provider, MD  traMADol (ULTRAM) 50 MG tablet Take 1 tablet (50 mg total)  by mouth every 6 (six) hours as needed. 05/14/15   Gilda Crease, MD   Dg Wrist Complete Right  05/14/2015   CLINICAL DATA:  Pt reports losing her balance, while trying to catch a falling bowl, and fell today on her right wrist. Swelling and pain noted to right distal radius/navicular area. Denies pain in hand.  EXAM: RIGHT WRIST - COMPLETE 3+ VIEW  COMPARISON:  None.  FINDINGS: There is a transverse fracture of the distal radial metaphysis. There is mild fracture comminution along the dorsal margin of the fracture. There is mild dorsal impaction of the fracture leading to dorsal angulation of the distal radial articular surface of approximately 10 degrees.  There are no other fractures. There is no dislocation. Bones are demineralized. There is diffuse surrounding soft tissue swelling.  IMPRESSION: Transverse fracture of the distal right radius with mild dorsal impaction and comminution.   Electronically Signed   By: Amie Portland M.D.   On: 05/14/2015 16:57   Dg Hand Complete Right  05/14/2015   CLINICAL DATA:  Pt reports losing her balance, while trying to catch a falling bowl, and fell today on her right wrist. Swelling and pain noted to right distal radius/navicular area. Denies pain in hand.  EXAM: RIGHT HAND - COMPLETE 3+ VIEW  COMPARISON:  None.  FINDINGS: Fracture of the distal radius. Please refer to the right wrist radiographs for further details.  No other fractures. Mild degenerative changes noted involving several of the interphalangeal joints most prominently the PIP joint of the middle finger. Bones are demineralized. There is right wrist soft tissue swelling.  IMPRESSION: Fracture distal right radius.  No hand fracture.  No dislocation.   Electronically Signed   By: Amie Portland M.D.   On: 05/14/2015 16:53    Positive ROS: All other systems have been reviewed and were otherwise negative with the exception of those mentioned in the HPI and as above.  Physical Exam: General: Alert,  no acute distress Cardiovascular: No pedal edema Respiratory: No cyanosis, no use of accessory musculature GI: No organomegaly, abdomen is soft and non-tender Skin: No lesions in the area of chief complaint Neurologic: Sensation intact distally Psychiatric: Patient is competent for consent with normal mood and affect Lymphatic: No axillary or cervical lymphadenopathy  MUSCULOSKELETAL: RUE in splint. + AIN/PIN/U. SILT R/U/M. Fingers warm and well perfused.  L sided weakness - baseline.  Assessment: R distal radius fracture  Plan: NWB RUE Keep splint clean and dry F/U with Dr. Orlan Leavens thus week    Linna Caprice Cloyde Reams, MD Cell [phone]    05/15/2015 8:09 AM

## 2015-05-16 DIAGNOSIS — F339 Major depressive disorder, recurrent, unspecified: Secondary | ICD-10-CM | POA: Diagnosis not present

## 2015-05-16 DIAGNOSIS — S52591S Other fractures of lower end of right radius, sequela: Secondary | ICD-10-CM | POA: Diagnosis not present

## 2015-05-16 DIAGNOSIS — G8190 Hemiplegia, unspecified affecting unspecified side: Secondary | ICD-10-CM | POA: Diagnosis not present

## 2015-05-16 DIAGNOSIS — M79641 Pain in right hand: Secondary | ICD-10-CM | POA: Diagnosis not present

## 2015-05-16 DIAGNOSIS — S52501A Unspecified fracture of the lower end of right radius, initial encounter for closed fracture: Secondary | ICD-10-CM | POA: Diagnosis not present

## 2015-05-16 DIAGNOSIS — E785 Hyperlipidemia, unspecified: Secondary | ICD-10-CM | POA: Diagnosis not present

## 2015-05-16 DIAGNOSIS — Z79899 Other long term (current) drug therapy: Secondary | ICD-10-CM | POA: Diagnosis not present

## 2015-05-16 DIAGNOSIS — N183 Chronic kidney disease, stage 3 (moderate): Secondary | ICD-10-CM | POA: Diagnosis not present

## 2015-05-16 DIAGNOSIS — M6281 Muscle weakness (generalized): Secondary | ICD-10-CM | POA: Diagnosis not present

## 2015-05-16 DIAGNOSIS — I471 Supraventricular tachycardia: Secondary | ICD-10-CM | POA: Diagnosis not present

## 2015-05-16 DIAGNOSIS — S6291XD Unspecified fracture of right wrist and hand, subsequent encounter for fracture with routine healing: Secondary | ICD-10-CM | POA: Diagnosis not present

## 2015-05-16 DIAGNOSIS — F329 Major depressive disorder, single episode, unspecified: Secondary | ICD-10-CM | POA: Diagnosis not present

## 2015-05-16 DIAGNOSIS — Z23 Encounter for immunization: Secondary | ICD-10-CM | POA: Diagnosis not present

## 2015-05-16 DIAGNOSIS — M1990 Unspecified osteoarthritis, unspecified site: Secondary | ICD-10-CM | POA: Diagnosis not present

## 2015-05-16 DIAGNOSIS — E119 Type 2 diabetes mellitus without complications: Secondary | ICD-10-CM | POA: Diagnosis not present

## 2015-05-16 DIAGNOSIS — R197 Diarrhea, unspecified: Secondary | ICD-10-CM | POA: Diagnosis not present

## 2015-05-16 DIAGNOSIS — R262 Difficulty in walking, not elsewhere classified: Secondary | ICD-10-CM | POA: Diagnosis not present

## 2015-05-16 DIAGNOSIS — R296 Repeated falls: Secondary | ICD-10-CM | POA: Diagnosis not present

## 2015-05-16 DIAGNOSIS — R000 Tachycardia, unspecified: Secondary | ICD-10-CM | POA: Diagnosis not present

## 2015-05-16 DIAGNOSIS — I129 Hypertensive chronic kidney disease with stage 1 through stage 4 chronic kidney disease, or unspecified chronic kidney disease: Secondary | ICD-10-CM | POA: Diagnosis not present

## 2015-05-16 DIAGNOSIS — R110 Nausea: Secondary | ICD-10-CM | POA: Diagnosis not present

## 2015-05-16 DIAGNOSIS — E1122 Type 2 diabetes mellitus with diabetic chronic kidney disease: Secondary | ICD-10-CM | POA: Diagnosis not present

## 2015-05-16 DIAGNOSIS — R5381 Other malaise: Secondary | ICD-10-CM | POA: Diagnosis not present

## 2015-05-16 DIAGNOSIS — I69354 Hemiplegia and hemiparesis following cerebral infarction affecting left non-dominant side: Secondary | ICD-10-CM | POA: Diagnosis not present

## 2015-05-16 DIAGNOSIS — G4700 Insomnia, unspecified: Secondary | ICD-10-CM | POA: Diagnosis not present

## 2015-05-16 DIAGNOSIS — S52531D Colles' fracture of right radius, subsequent encounter for closed fracture with routine healing: Secondary | ICD-10-CM | POA: Diagnosis not present

## 2015-05-16 DIAGNOSIS — I10 Essential (primary) hypertension: Secondary | ICD-10-CM | POA: Diagnosis not present

## 2015-05-16 DIAGNOSIS — R112 Nausea with vomiting, unspecified: Secondary | ICD-10-CM | POA: Diagnosis not present

## 2015-05-16 DIAGNOSIS — D631 Anemia in chronic kidney disease: Secondary | ICD-10-CM | POA: Diagnosis not present

## 2015-05-16 DIAGNOSIS — S62101K Fracture of unspecified carpal bone, right wrist, subsequent encounter for fracture with nonunion: Secondary | ICD-10-CM | POA: Diagnosis not present

## 2015-05-16 DIAGNOSIS — Z791 Long term (current) use of non-steroidal anti-inflammatories (NSAID): Secondary | ICD-10-CM | POA: Diagnosis not present

## 2015-05-16 DIAGNOSIS — Z4789 Encounter for other orthopedic aftercare: Secondary | ICD-10-CM | POA: Diagnosis not present

## 2015-05-16 DIAGNOSIS — I6990 Unspecified sequelae of unspecified cerebrovascular disease: Secondary | ICD-10-CM | POA: Diagnosis not present

## 2015-05-16 DIAGNOSIS — Z7902 Long term (current) use of antithrombotics/antiplatelets: Secondary | ICD-10-CM | POA: Diagnosis not present

## 2015-05-16 DIAGNOSIS — M79631 Pain in right forearm: Secondary | ICD-10-CM | POA: Diagnosis not present

## 2015-05-16 DIAGNOSIS — I69359 Hemiplegia and hemiparesis following cerebral infarction affecting unspecified side: Secondary | ICD-10-CM | POA: Diagnosis not present

## 2015-05-16 DIAGNOSIS — S52531A Colles' fracture of right radius, initial encounter for closed fracture: Secondary | ICD-10-CM | POA: Diagnosis not present

## 2015-05-16 LAB — BASIC METABOLIC PANEL
Anion gap: 8 (ref 5–15)
BUN: 21 mg/dL — AB (ref 6–20)
CALCIUM: 9.1 mg/dL (ref 8.9–10.3)
CHLORIDE: 102 mmol/L (ref 101–111)
CO2: 24 mmol/L (ref 22–32)
CREATININE: 1.42 mg/dL — AB (ref 0.44–1.00)
GFR calc non Af Amer: 32 mL/min — ABNORMAL LOW (ref >60)
GFR, EST AFRICAN AMERICAN: 37 mL/min — AB (ref >60)
Glucose, Bld: 158 mg/dL — ABNORMAL HIGH (ref 65–99)
Potassium: 4.7 mmol/L (ref 3.5–5.1)
SODIUM: 134 mmol/L — AB (ref 135–145)

## 2015-05-16 LAB — HEMOGLOBIN A1C
Hgb A1c MFr Bld: 6.5 % — ABNORMAL HIGH (ref 4.8–5.6)
Mean Plasma Glucose: 140 mg/dL

## 2015-05-16 LAB — GLUCOSE, CAPILLARY
GLUCOSE-CAPILLARY: 139 mg/dL — AB (ref 65–99)
GLUCOSE-CAPILLARY: 224 mg/dL — AB (ref 65–99)

## 2015-05-16 MED ORDER — ENSURE ENLIVE PO LIQD: 237.0000 mL | Freq: Two times a day (BID) | ORAL | Status: DC

## 2015-05-16 MED ORDER — TRAMADOL HCL 50 MG PO TABS: 50.0000 mg | ORAL_TABLET | Freq: Four times a day (QID) | ORAL | Status: DC | PRN

## 2015-05-16 NOTE — Discharge Summary (Signed)
Physician Discharge Summary  Patricia Andrews ZOX:[id number] DOB: [birth year] DOA: 05/14/2015  PCP: Gwendolyn Grant, MD  Admit date: 05/14/2015 Discharge date: 05/16/2015  Time spent: 35 minutes  Recommendations for Outpatient Follow-up:  Needs to follow up with Ortho in 1 week.  Needs B-met to follow renal function. Depending on renal function consider resuming benicar and metformin.   Discharge Diagnoses:    Right wrist fracture   Diabetes type 2, controlled   CVA, old, hemiparesis   Hypertension   Hyperlipidemia   CKD stage III  Discharge Condition: Stable.   Diet recommendation: Carb modified. Needs assistance with meal;s.   Filed Weights   05/15/15 0100  Weight: 53.2 kg (117 lb 4.6 oz)    History of present illness:  Patricia Andrews is a 81 y.o. female with a history of a CVA with Left Hemiparesis, HTN, DM2 Hyperlipidemia who presents to ED after losing her balance in her home and falling onto her right arm. She denies any LOC. She was brought to the ED and found to have a Right Wrist Fracture. She walks with a cane and is unable to walk. She lives alone. She has 5/10 pain at this time . She has been referred for Observation.   Hospital Course:  1-R distal radius fracture Pain management.  Ortho consulted and following.  Plan to follow with Dr Apolonio Schneiders in 1 week.   2-Diabetes type 2, controlled Resume amaryl. Hold metformin due to mild increased cr.  SSI coverage PRN  3-CVA, old, hemiparesis contiue Plavix .  4-Hypertension Continue , Amlodipine Rx. Follow K level and renal function.  Hold benicar due to mild increase cr.   5-Hyperlipidemia Continue Simvastatin.  5-CKD stage III, Cr prior records, 1.3 Hold metformin and benicar due to mild increased cr. Repeat labs this week.     Procedures: none Consultations:  Ortho, Dr Apolonio Schneiders  Discharge Exam: Filed Vitals:   05/16/15 0556  BP: 131/67  Pulse: 83  Temp: 98.4 F (36.9 C)  Resp:  18    General: Alert in no acute distress Cardiovascular: S 1, S 2 RRR Respiratory: CTA Left arm with hemiparesis, right arm cast.   Discharge Instructions   Discharge Instructions    Diet - low sodium heart healthy    Complete by:  As directed      Increase activity slowly    Complete by:  As directed           Current Discharge Medication List    START taking these medications   Details  feeding supplement, ENSURE ENLIVE, (ENSURE ENLIVE) LIQD Take 237 mLs by mouth 2 (two) times daily between meals. Qty: 237 mL, Refills: 12    traMADol (ULTRAM) 50 MG tablet Take 1 tablet (50 mg total) by mouth every 6 (six) hours as needed. Qty: 15 tablet, Refills: 0      CONTINUE these medications which have NOT CHANGED   Details  amLODipine (NORVASC) 5 MG tablet Take 1 tablet (5 mg total) by mouth daily. Qty: 90 tablet, Refills: 3    cholecalciferol (VITAMIN D) 1000 UNITS tablet Take 1,000 Units by mouth daily.    citalopram (CELEXA) 20 MG tablet Take 1 tablet (20 mg total) by mouth daily. Qty: 90 tablet, Refills: 3    clopidogrel (PLAVIX) 75 MG tablet Take 1 tablet (75 mg total) by mouth daily with breakfast. Qty: 90 tablet, Refills: 3    denosumab (PROLIA) 60 MG/ML SOLN injection Inject 60 mg into the skin every 6 (six)  months. Administer in upper arm, thigh, or abdomen    ferrous sulfate 325 (65 FE) MG tablet Take 325 mg by mouth daily with breakfast.    glimepiride (AMARYL) 2 MG tablet Take 1 tablet (2 mg total) by mouth daily with breakfast. Qty: 90 tablet, Refills: 2    mirtazapine (REMERON) 15 MG tablet Take 1 tablet (15 mg total) by mouth at bedtime. Qty: 90 tablet, Refills: 3    Omega-3 Fatty Acids (FISH OIL PO) Take 1 capsule by mouth every morning.    simvastatin (ZOCOR) 20 MG tablet Take 1 tablet (20 mg total) by mouth at bedtime. Qty: 90 tablet, Refills: 3    zolpidem (AMBIEN) 5 MG tablet Take 1 tablet (5 mg total) by mouth at bedtime as needed. For  sleep. Qty: 30 tablet, Refills: 3    bisacodyl (DULCOLAX) 5 MG EC tablet Take 5 mg by mouth daily as needed. For constipation.      STOP taking these medications     metFORMIN (GLUCOPHAGE) 500 MG tablet      olmesartan (BENICAR) 20 MG tablet      cyclobenzaprine (FLEXERIL) 5 MG tablet      naproxen sodium (ANAPROX) 220 MG tablet        No Known Allergies Follow-up Information    Follow up with Linna Hoff, MD.   Specialty:  Orthopedic Surgery   Contact information:   [address] [place] [address] [place] [address] [phone]        The results of significant diagnostics from this hospitalization (including imaging, microbiology, ancillary and laboratory) are listed below for reference.    Significant Diagnostic Studies: Dg Wrist Complete Right  05/14/2015   CLINICAL DATA:  Pt reports losing her balance, while trying to catch a falling bowl, and fell today on her right wrist. Swelling and pain noted to right distal radius/navicular area. Denies pain in hand.  EXAM: RIGHT WRIST - COMPLETE 3+ VIEW  COMPARISON:  None.  FINDINGS: There is a transverse fracture of the distal radial metaphysis. There is mild fracture comminution along the dorsal margin of the fracture. There is mild dorsal impaction of the fracture leading to dorsal angulation of the distal radial articular surface of approximately 10 degrees.  There are no other fractures. There is no dislocation. Bones are demineralized. There is diffuse surrounding soft tissue swelling.  IMPRESSION: Transverse fracture of the distal right radius with mild dorsal impaction and comminution.   Electronically Signed   By: Lajean Manes M.D.   On: 05/14/2015 16:57   Dg Hand Complete Right  05/14/2015   CLINICAL DATA:  Pt reports losing her balance, while trying to catch a falling bowl, and fell today on her right wrist. Swelling and pain noted to right distal radius/navicular area. Denies pain in hand.  EXAM: RIGHT HAND -  COMPLETE 3+ VIEW  COMPARISON:  None.  FINDINGS: Fracture of the distal radius. Please refer to the right wrist radiographs for further details.  No other fractures. Mild degenerative changes noted involving several of the interphalangeal joints most prominently the PIP joint of the middle finger. Bones are demineralized. There is right wrist soft tissue swelling.  IMPRESSION: Fracture distal right radius.  No hand fracture.  No dislocation.   Electronically Signed   By: Lajean Manes M.D.   On: 05/14/2015 16:53    Microbiology: No results found for this or any previous visit (from the past 240 hour(s)).   Labs: Basic Metabolic Panel:  Recent Labs Lab 05/14/15  2201 05/15/15 0523 05/16/15 0521  NA 137 139 134*  K 5.2* 4.9 4.7  CL 106 107 102  CO2 23 24 24   GLUCOSE 156* 123* 158*  BUN 20 16 21*  CREATININE 1.33* 1.29* 1.42*  CALCIUM 9.4 9.0 9.1   Liver Function Tests: No results for input(s): AST, ALT, ALKPHOS, BILITOT, PROT, ALBUMIN in the last 168 hours. No results for input(s): LIPASE, AMYLASE in the last 168 hours. No results for input(s): AMMONIA in the last 168 hours. CBC:  Recent Labs Lab 05/14/15 2201 05/15/15 0523  WBC 8.1 4.7  NEUTROABS 5.7  --   HGB 9.8* 9.3*  HCT 30.6* 29.5*  MCV 93.3 93.1  PLT 260 176   Cardiac Enzymes: No results for input(s): CKTOTAL, CKMB, CKMBINDEX, TROPONINI in the last 168 hours. BNP: BNP (last 3 results) No results for input(s): BNP in the last 8760 hours.  ProBNP (last 3 results) No results for input(s): PROBNP in the last 8760 hours.  CBG:  Recent Labs Lab 05/15/15 0721 05/15/15 1226 05/15/15 1641 05/15/15 2146 05/16/15 0759  GLUCAP 136* 167* 119* 154* 139*       Signed:  Regalado, Belkys A  [facility] 05/16/2015, 10:02 AM

## 2015-05-16 NOTE — Progress Notes (Signed)
Occupational Therapy Treatment Patient Details Name: Patricia Andrews MRN: [id number] DOB: 1926-01-10 Today's Date: 05/16/2015    History of present illness Patricia Andrews is a 81 y.o. female with a history of a CVA with Left Hemiparesis, HTN, pelvic fx, L wrist fx, DM2,  Hyperlipidemia who presents to ED after losing her balance in her home and falling onto her right arm.   OT comments  MD did respond and state splint could not be changed until follow up with Dr Melvyn Novas  Follow Up Recommendations  SNF          Precautions / Restrictions Restrictions RUE Weight Bearing: Non weight bearing RLE Weight Bearing: Non weight bearing (iv in right foot)       Mobility Bed Mobility                  Transfers                      Balance                                   ADL Overall ADL's : Needs assistance/impaired Eating/Feeding: Maximal assistance                                     General ADL Comments: OT called MD regarding splint to see if it could be a bit shorter so pt could feed herself. As of now pt not able to bring RUE to face to wash or feed self.        Vision                     Perception     Praxis      Cognition                             Extremity/Trunk Assessment               Exercises     Shoulder Instructions       General Comments      Pertinent Vitals/ Pain          Home Living                                          Prior Functioning/Environment              Frequency Min 2X/week     Progress Toward Goals  OT Goals(current goals can now be found in the care plan section)        Plan      Co-evaluation                 End of Session     Activity Tolerance Patient tolerated treatment well   Patient Left in bed with call bell within reach   Nurse Communication  needs for A with meals and ADL's    Functional  Assessment Tool Used: clinical observation and judgment Functional Limitation: Self care Self Care Current Status (W0981): 100 percent impaired, limited or restricted Self Care Goal Status (X9147): At least 60 percent but less than 80 percent impaired, limited or restricted   Time: [phone]  OT Time Calculation (min): 10 min  Charges: OT G-codes **NOT FOR INPATIENT CLASS** Functional Assessment Tool Used: clinical observation and judgment Functional Limitation: Self care Self Care Current Status (Z6109): 100 percent impaired, limited or restricted Self Care Goal Status (U0454): At least 60 percent but less than 80 percent impaired, limited or restricted OT General Charges $OT Visit: 1 Procedure OT Treatments $Self Care/Home Management : 8-22 mins  REDDING, Metro Kung 05/16/2015, 10:53 AM

## 2015-05-16 NOTE — Clinical Social Work Placement (Signed)
   CLINICAL SOCIAL WORK PLACEMENT  NOTE  Date:  05/16/2015  Patient Details  Name: Patricia Andrews MRN: [id number] Date of Birth: Mar 21, 1926  Clinical Social Work is seeking post-discharge placement for this patient at the Skilled  Nursing Facility level of care (*CSW will initial, date and re-position this form in  chart as items are completed):  Yes   Patient/family provided with [name] Department's list of facilities offering this level of care within the geographic area requested by the patient (or if unable, by the patient's family).  Yes   Patient/family informed of their freedom to choose among providers that offer the needed level of care, that participate in Medicare, Medicaid or managed care program needed by the patient, have an available bed and are willing to accept the patient.  Yes   Patient/family informed of [name] ownership interest in [facility] and [facility], as well as of the fact that they are under no obligation to receive care at these facilities.  PASRR submitted to EDS on       PASRR number received on       Existing PASRR number confirmed on 05/15/15     FL2 transmitted to all facilities in geographic area requested by pt/family on 05/15/15     FL2 transmitted to all facilities within larger geographic area on       Patient informed that his/her managed care company has contracts with or will negotiate with certain facilities, including the following:        Yes   Patient/family informed of bed offers received.  Patient chooses bed at [facility]     Physician recommends and patient chooses bed at      Patient to be transferred to [facility] on 05/16/15.  Patient to be transferred to facility by [facility]     Patient family notified on 05/16/15 of transfer.  Name of family member notified:  DAUGHTER     PHYSICIAN       Additional Comment: Pt / daughter are in agreement with d/c to  [facility] care today. PT recommended [facility] transport. Pt / family are aware that out of pocket costs may be associated with [facility] transport. NSG reviewed d/c summary, scripts, avs. Scripts included in d/c packet. D/C summary sent to SNF for review prior to d/c.   _______________________________________________ Royetta Asal, LCSW  [phone] 05/16/2015, 1:44 PM

## 2015-05-16 NOTE — Progress Notes (Signed)
Occupational Therapy Treatment Patient Details Name: Patricia Andrews MRN: [id number] DOB: [birth year] Today's Date: 05/16/2015    History of present illness Patricia Andrews is a 81 y.o. female with a history of a CVA with Left Hemiparesis, HTN, pelvic fx, L wrist fx, DM2,  Hyperlipidemia who presents to ED after losing her balance in her home and falling onto her right arm.   OT comments  OT met with pts daugther regarding positioning of RUE  Follow Up Recommendations  SNF          Precautions / Restrictions Precautions Precautions: Fall Precaution Comments: pt denies other falls in past year, but has had prior fallls resulting in L wrist fx and pelvic fx several years ago Required Braces or Orthoses: Sling Restrictions RUE Weight Bearing: Non weight bearing RLE Weight Bearing: Non weight bearing (due to IV--pt is a difficult stick)       Mobility Bed Mobility               General bed mobility comments: did not perform   Transfers                 General transfer comment: did not perform         ADL Overall ADL's : Needs assistance/impaired Eating/Feeding: Maximal assistance                                     General ADL Comments: OT met with pts daughter regarding positioning , finger ROM, edema control for RUE as well as positoining for LUE (flaccid LUE from old CVA)                Cognition   Behavior During Therapy: WFL for tasks assessed/performed Overall Cognitive Status: Within Functional Limits for tasks assessed                         Exercises  Instructed pt to perform PROM on fingers RUE as well as R shoulder           Pertinent Vitals/ Pain       Pain Assessment: No/denies pain         Frequency Min 2X/week     Progress Toward Goals  OT Goals(current goals can now be found in the care plan section)  Progress towards OT goals: Progressing toward goals     Plan Discharge plan remains  appropriate       End of Session     Activity Tolerance Patient tolerated treatment well   Patient Left in bed;with call bell/phone within reach;with family/visitor present   Nurse Communication Mobility status    Functional Assessment Tool Used: clinical observation and judgment Functional Limitation: Self care Self Care Current Status (P2244): 100 percent impaired, limited or restricted Self Care Goal Status (L7530): At least 60 percent but less than 80 percent impaired, limited or restricted   Time: 1400-1412 OT Time Calculation (min): 12 min  Charges: OT G-codes **NOT FOR INPATIENT CLASS** Functional Assessment Tool Used: clinical observation and judgment Functional Limitation: Self care Self Care Current Status (Y5110): 100 percent impaired, limited or restricted Self Care Goal Status (Y1117): At least 60 percent but less than 80 percent impaired, limited or restricted OT General Charges $OT Visit: 1 Procedure OT Treatments $Self Care/Home Management : 8-22 mins  [place], Thereasa Parkin 05/16/2015, 2:26 PM

## 2015-05-16 NOTE — Progress Notes (Signed)
RN called report to [employer]. All questions answered.   Patient transported via [facility] to SNF.

## 2015-05-17 DIAGNOSIS — S52531A Colles' fracture of right radius, initial encounter for closed fracture: Secondary | ICD-10-CM | POA: Diagnosis not present

## 2015-05-18 DIAGNOSIS — M6281 Muscle weakness (generalized): Secondary | ICD-10-CM | POA: Diagnosis not present

## 2015-05-18 DIAGNOSIS — I6990 Unspecified sequelae of unspecified cerebrovascular disease: Secondary | ICD-10-CM | POA: Diagnosis not present

## 2015-05-18 DIAGNOSIS — M79631 Pain in right forearm: Secondary | ICD-10-CM | POA: Diagnosis not present

## 2015-05-18 DIAGNOSIS — R262 Difficulty in walking, not elsewhere classified: Secondary | ICD-10-CM | POA: Diagnosis not present

## 2015-05-19 DIAGNOSIS — N183 Chronic kidney disease, stage 3 (moderate): Secondary | ICD-10-CM | POA: Diagnosis not present

## 2015-05-19 DIAGNOSIS — I10 Essential (primary) hypertension: Secondary | ICD-10-CM | POA: Diagnosis not present

## 2015-05-19 DIAGNOSIS — E119 Type 2 diabetes mellitus without complications: Secondary | ICD-10-CM | POA: Diagnosis not present

## 2015-05-19 DIAGNOSIS — S6291XD Unspecified fracture of right wrist and hand, subsequent encounter for fracture with routine healing: Secondary | ICD-10-CM | POA: Diagnosis not present

## 2015-05-23 DIAGNOSIS — M6281 Muscle weakness (generalized): Secondary | ICD-10-CM | POA: Diagnosis not present

## 2015-05-23 DIAGNOSIS — M79631 Pain in right forearm: Secondary | ICD-10-CM | POA: Diagnosis not present

## 2015-05-23 DIAGNOSIS — I6990 Unspecified sequelae of unspecified cerebrovascular disease: Secondary | ICD-10-CM | POA: Diagnosis not present

## 2015-05-23 DIAGNOSIS — R262 Difficulty in walking, not elsewhere classified: Secondary | ICD-10-CM | POA: Diagnosis not present

## 2015-05-24 DIAGNOSIS — S52509A Unspecified fracture of the lower end of unspecified radius, initial encounter for closed fracture: Secondary | ICD-10-CM | POA: Insufficient documentation

## 2015-05-26 DIAGNOSIS — M79631 Pain in right forearm: Secondary | ICD-10-CM | POA: Diagnosis not present

## 2015-05-26 DIAGNOSIS — M6281 Muscle weakness (generalized): Secondary | ICD-10-CM | POA: Diagnosis not present

## 2015-05-26 DIAGNOSIS — I6990 Unspecified sequelae of unspecified cerebrovascular disease: Secondary | ICD-10-CM | POA: Diagnosis not present

## 2015-05-26 DIAGNOSIS — R262 Difficulty in walking, not elsewhere classified: Secondary | ICD-10-CM | POA: Diagnosis not present

## 2015-05-31 DIAGNOSIS — R262 Difficulty in walking, not elsewhere classified: Secondary | ICD-10-CM | POA: Diagnosis not present

## 2015-05-31 DIAGNOSIS — M79631 Pain in right forearm: Secondary | ICD-10-CM | POA: Diagnosis not present

## 2015-05-31 DIAGNOSIS — M6281 Muscle weakness (generalized): Secondary | ICD-10-CM | POA: Diagnosis not present

## 2015-05-31 DIAGNOSIS — I6990 Unspecified sequelae of unspecified cerebrovascular disease: Secondary | ICD-10-CM | POA: Diagnosis not present

## 2015-06-02 DIAGNOSIS — S52531D Colles' fracture of right radius, subsequent encounter for closed fracture with routine healing: Secondary | ICD-10-CM | POA: Diagnosis not present

## 2015-06-06 DIAGNOSIS — R5381 Other malaise: Secondary | ICD-10-CM | POA: Diagnosis not present

## 2015-06-06 DIAGNOSIS — R197 Diarrhea, unspecified: Secondary | ICD-10-CM | POA: Diagnosis not present

## 2015-06-06 DIAGNOSIS — R112 Nausea with vomiting, unspecified: Secondary | ICD-10-CM | POA: Diagnosis not present

## 2015-06-06 DIAGNOSIS — I471 Supraventricular tachycardia: Secondary | ICD-10-CM | POA: Diagnosis not present

## 2015-06-07 DIAGNOSIS — I471 Supraventricular tachycardia: Secondary | ICD-10-CM | POA: Diagnosis not present

## 2015-06-07 DIAGNOSIS — I6990 Unspecified sequelae of unspecified cerebrovascular disease: Secondary | ICD-10-CM | POA: Diagnosis not present

## 2015-06-07 DIAGNOSIS — M6281 Muscle weakness (generalized): Secondary | ICD-10-CM | POA: Diagnosis not present

## 2015-06-07 DIAGNOSIS — R5381 Other malaise: Secondary | ICD-10-CM | POA: Diagnosis not present

## 2015-06-07 DIAGNOSIS — R110 Nausea: Secondary | ICD-10-CM | POA: Diagnosis not present

## 2015-06-14 DIAGNOSIS — I6990 Unspecified sequelae of unspecified cerebrovascular disease: Secondary | ICD-10-CM | POA: Diagnosis not present

## 2015-06-14 DIAGNOSIS — M6281 Muscle weakness (generalized): Secondary | ICD-10-CM | POA: Diagnosis not present

## 2015-06-16 DIAGNOSIS — Z4789 Encounter for other orthopedic aftercare: Secondary | ICD-10-CM | POA: Diagnosis not present

## 2015-06-16 DIAGNOSIS — S52531D Colles' fracture of right radius, subsequent encounter for closed fracture with routine healing: Secondary | ICD-10-CM | POA: Diagnosis not present

## 2015-06-17 DIAGNOSIS — I10 Essential (primary) hypertension: Secondary | ICD-10-CM | POA: Diagnosis not present

## 2015-06-17 DIAGNOSIS — S52591S Other fractures of lower end of right radius, sequela: Secondary | ICD-10-CM | POA: Diagnosis not present

## 2015-06-17 DIAGNOSIS — M6281 Muscle weakness (generalized): Secondary | ICD-10-CM | POA: Diagnosis not present

## 2015-06-17 DIAGNOSIS — N183 Chronic kidney disease, stage 3 (moderate): Secondary | ICD-10-CM | POA: Diagnosis not present

## 2015-06-17 DIAGNOSIS — I69359 Hemiplegia and hemiparesis following cerebral infarction affecting unspecified side: Secondary | ICD-10-CM | POA: Diagnosis not present

## 2015-06-21 DIAGNOSIS — M6281 Muscle weakness (generalized): Secondary | ICD-10-CM | POA: Diagnosis not present

## 2015-06-21 DIAGNOSIS — I69359 Hemiplegia and hemiparesis following cerebral infarction affecting unspecified side: Secondary | ICD-10-CM | POA: Diagnosis not present

## 2015-06-21 DIAGNOSIS — G8190 Hemiplegia, unspecified affecting unspecified side: Secondary | ICD-10-CM | POA: Diagnosis not present

## 2015-06-24 DIAGNOSIS — M1990 Unspecified osteoarthritis, unspecified site: Secondary | ICD-10-CM | POA: Diagnosis not present

## 2015-06-24 DIAGNOSIS — Z794 Long term (current) use of insulin: Secondary | ICD-10-CM | POA: Diagnosis not present

## 2015-06-24 DIAGNOSIS — E785 Hyperlipidemia, unspecified: Secondary | ICD-10-CM | POA: Diagnosis not present

## 2015-06-24 DIAGNOSIS — F329 Major depressive disorder, single episode, unspecified: Secondary | ICD-10-CM | POA: Diagnosis not present

## 2015-06-24 DIAGNOSIS — N183 Chronic kidney disease, stage 3 (moderate): Secondary | ICD-10-CM | POA: Diagnosis not present

## 2015-06-24 DIAGNOSIS — D631 Anemia in chronic kidney disease: Secondary | ICD-10-CM | POA: Diagnosis not present

## 2015-06-24 DIAGNOSIS — I129 Hypertensive chronic kidney disease with stage 1 through stage 4 chronic kidney disease, or unspecified chronic kidney disease: Secondary | ICD-10-CM | POA: Diagnosis not present

## 2015-06-24 DIAGNOSIS — F0390 Unspecified dementia without behavioral disturbance: Secondary | ICD-10-CM | POA: Diagnosis not present

## 2015-06-24 DIAGNOSIS — E1122 Type 2 diabetes mellitus with diabetic chronic kidney disease: Secondary | ICD-10-CM | POA: Diagnosis not present

## 2015-06-24 DIAGNOSIS — Z7902 Long term (current) use of antithrombotics/antiplatelets: Secondary | ICD-10-CM | POA: Diagnosis not present

## 2015-06-24 DIAGNOSIS — I69354 Hemiplegia and hemiparesis following cerebral infarction affecting left non-dominant side: Secondary | ICD-10-CM | POA: Diagnosis not present

## 2015-06-24 DIAGNOSIS — R262 Difficulty in walking, not elsewhere classified: Secondary | ICD-10-CM | POA: Diagnosis not present

## 2015-06-24 DIAGNOSIS — E119 Type 2 diabetes mellitus without complications: Secondary | ICD-10-CM | POA: Diagnosis not present

## 2015-06-24 DIAGNOSIS — Z9181 History of falling: Secondary | ICD-10-CM | POA: Diagnosis not present

## 2015-06-24 DIAGNOSIS — S52561D Barton's fracture of right radius, subsequent encounter for closed fracture with routine healing: Secondary | ICD-10-CM | POA: Diagnosis not present

## 2015-06-28 DIAGNOSIS — Z9181 History of falling: Secondary | ICD-10-CM | POA: Diagnosis not present

## 2015-06-28 DIAGNOSIS — D631 Anemia in chronic kidney disease: Secondary | ICD-10-CM | POA: Diagnosis not present

## 2015-06-28 DIAGNOSIS — N183 Chronic kidney disease, stage 3 (moderate): Secondary | ICD-10-CM | POA: Diagnosis not present

## 2015-06-28 DIAGNOSIS — E1122 Type 2 diabetes mellitus with diabetic chronic kidney disease: Secondary | ICD-10-CM | POA: Diagnosis not present

## 2015-06-28 DIAGNOSIS — F0390 Unspecified dementia without behavioral disturbance: Secondary | ICD-10-CM | POA: Diagnosis not present

## 2015-06-28 DIAGNOSIS — Z7902 Long term (current) use of antithrombotics/antiplatelets: Secondary | ICD-10-CM | POA: Diagnosis not present

## 2015-06-28 DIAGNOSIS — M1990 Unspecified osteoarthritis, unspecified site: Secondary | ICD-10-CM | POA: Diagnosis not present

## 2015-06-28 DIAGNOSIS — R262 Difficulty in walking, not elsewhere classified: Secondary | ICD-10-CM | POA: Diagnosis not present

## 2015-06-28 DIAGNOSIS — I69354 Hemiplegia and hemiparesis following cerebral infarction affecting left non-dominant side: Secondary | ICD-10-CM | POA: Diagnosis not present

## 2015-06-28 DIAGNOSIS — S52561D Barton's fracture of right radius, subsequent encounter for closed fracture with routine healing: Secondary | ICD-10-CM | POA: Diagnosis not present

## 2015-06-28 DIAGNOSIS — I129 Hypertensive chronic kidney disease with stage 1 through stage 4 chronic kidney disease, or unspecified chronic kidney disease: Secondary | ICD-10-CM | POA: Diagnosis not present

## 2015-06-28 DIAGNOSIS — E785 Hyperlipidemia, unspecified: Secondary | ICD-10-CM | POA: Diagnosis not present

## 2015-06-28 DIAGNOSIS — Z794 Long term (current) use of insulin: Secondary | ICD-10-CM | POA: Diagnosis not present

## 2015-06-28 DIAGNOSIS — F329 Major depressive disorder, single episode, unspecified: Secondary | ICD-10-CM | POA: Diagnosis not present

## 2015-07-04 ENCOUNTER — Telehealth: Payer: Self-pay | Admitting: Internal Medicine

## 2015-07-04 NOTE — Telephone Encounter (Signed)
Clydie BraunKaren is calling for verbals on HHC/OT-  1x/week for 3 weeks  2x/week for 1 week

## 2015-07-05 NOTE — Telephone Encounter (Signed)
Number listed to call [facility] back has been disconnected.

## 2015-07-07 DIAGNOSIS — Z4789 Encounter for other orthopedic aftercare: Secondary | ICD-10-CM | POA: Diagnosis not present

## 2015-07-08 ENCOUNTER — Other Ambulatory Visit: Payer: Self-pay

## 2015-07-08 DIAGNOSIS — R262 Difficulty in walking, not elsewhere classified: Secondary | ICD-10-CM | POA: Diagnosis not present

## 2015-07-08 DIAGNOSIS — I129 Hypertensive chronic kidney disease with stage 1 through stage 4 chronic kidney disease, or unspecified chronic kidney disease: Secondary | ICD-10-CM | POA: Diagnosis not present

## 2015-07-08 DIAGNOSIS — D631 Anemia in chronic kidney disease: Secondary | ICD-10-CM | POA: Diagnosis not present

## 2015-07-08 DIAGNOSIS — Z794 Long term (current) use of insulin: Secondary | ICD-10-CM | POA: Diagnosis not present

## 2015-07-08 DIAGNOSIS — Z7902 Long term (current) use of antithrombotics/antiplatelets: Secondary | ICD-10-CM | POA: Diagnosis not present

## 2015-07-08 DIAGNOSIS — S52561D Barton's fracture of right radius, subsequent encounter for closed fracture with routine healing: Secondary | ICD-10-CM | POA: Diagnosis not present

## 2015-07-08 DIAGNOSIS — F329 Major depressive disorder, single episode, unspecified: Secondary | ICD-10-CM | POA: Diagnosis not present

## 2015-07-08 DIAGNOSIS — M1990 Unspecified osteoarthritis, unspecified site: Secondary | ICD-10-CM | POA: Diagnosis not present

## 2015-07-08 DIAGNOSIS — F0390 Unspecified dementia without behavioral disturbance: Secondary | ICD-10-CM | POA: Diagnosis not present

## 2015-07-08 DIAGNOSIS — E1122 Type 2 diabetes mellitus with diabetic chronic kidney disease: Secondary | ICD-10-CM | POA: Diagnosis not present

## 2015-07-08 DIAGNOSIS — E785 Hyperlipidemia, unspecified: Secondary | ICD-10-CM | POA: Diagnosis not present

## 2015-07-08 DIAGNOSIS — N183 Chronic kidney disease, stage 3 (moderate): Secondary | ICD-10-CM | POA: Diagnosis not present

## 2015-07-08 DIAGNOSIS — Z9181 History of falling: Secondary | ICD-10-CM | POA: Diagnosis not present

## 2015-07-08 DIAGNOSIS — I69354 Hemiplegia and hemiparesis following cerebral infarction affecting left non-dominant side: Secondary | ICD-10-CM | POA: Diagnosis not present

## 2015-07-10 DIAGNOSIS — M6281 Muscle weakness (generalized): Secondary | ICD-10-CM | POA: Diagnosis not present

## 2015-07-10 DIAGNOSIS — I69359 Hemiplegia and hemiparesis following cerebral infarction affecting unspecified side: Secondary | ICD-10-CM | POA: Diagnosis not present

## 2015-07-10 DIAGNOSIS — G8190 Hemiplegia, unspecified affecting unspecified side: Secondary | ICD-10-CM | POA: Diagnosis not present

## 2015-07-12 ENCOUNTER — Ambulatory Visit: Payer: Self-pay | Admitting: Podiatry

## 2015-07-12 DIAGNOSIS — F0390 Unspecified dementia without behavioral disturbance: Secondary | ICD-10-CM | POA: Diagnosis not present

## 2015-07-12 DIAGNOSIS — E1122 Type 2 diabetes mellitus with diabetic chronic kidney disease: Secondary | ICD-10-CM | POA: Diagnosis not present

## 2015-07-12 DIAGNOSIS — F329 Major depressive disorder, single episode, unspecified: Secondary | ICD-10-CM | POA: Diagnosis not present

## 2015-07-12 DIAGNOSIS — D631 Anemia in chronic kidney disease: Secondary | ICD-10-CM | POA: Diagnosis not present

## 2015-07-12 DIAGNOSIS — S52561D Barton's fracture of right radius, subsequent encounter for closed fracture with routine healing: Secondary | ICD-10-CM | POA: Diagnosis not present

## 2015-07-12 DIAGNOSIS — I69354 Hemiplegia and hemiparesis following cerebral infarction affecting left non-dominant side: Secondary | ICD-10-CM | POA: Diagnosis not present

## 2015-07-12 DIAGNOSIS — Z794 Long term (current) use of insulin: Secondary | ICD-10-CM | POA: Diagnosis not present

## 2015-07-12 DIAGNOSIS — M1990 Unspecified osteoarthritis, unspecified site: Secondary | ICD-10-CM | POA: Diagnosis not present

## 2015-07-12 DIAGNOSIS — I129 Hypertensive chronic kidney disease with stage 1 through stage 4 chronic kidney disease, or unspecified chronic kidney disease: Secondary | ICD-10-CM | POA: Diagnosis not present

## 2015-07-12 DIAGNOSIS — R262 Difficulty in walking, not elsewhere classified: Secondary | ICD-10-CM | POA: Diagnosis not present

## 2015-07-12 DIAGNOSIS — Z9181 History of falling: Secondary | ICD-10-CM | POA: Diagnosis not present

## 2015-07-12 DIAGNOSIS — N183 Chronic kidney disease, stage 3 (moderate): Secondary | ICD-10-CM | POA: Diagnosis not present

## 2015-07-12 DIAGNOSIS — Z7902 Long term (current) use of antithrombotics/antiplatelets: Secondary | ICD-10-CM | POA: Diagnosis not present

## 2015-07-12 DIAGNOSIS — E785 Hyperlipidemia, unspecified: Secondary | ICD-10-CM | POA: Diagnosis not present

## 2015-07-13 ENCOUNTER — Ambulatory Visit: Payer: Self-pay | Admitting: Internal Medicine

## 2015-07-13 DIAGNOSIS — Z9181 History of falling: Secondary | ICD-10-CM | POA: Diagnosis not present

## 2015-07-13 DIAGNOSIS — E785 Hyperlipidemia, unspecified: Secondary | ICD-10-CM | POA: Diagnosis not present

## 2015-07-13 DIAGNOSIS — E1122 Type 2 diabetes mellitus with diabetic chronic kidney disease: Secondary | ICD-10-CM | POA: Diagnosis not present

## 2015-07-13 DIAGNOSIS — I69354 Hemiplegia and hemiparesis following cerebral infarction affecting left non-dominant side: Secondary | ICD-10-CM | POA: Diagnosis not present

## 2015-07-13 DIAGNOSIS — Z794 Long term (current) use of insulin: Secondary | ICD-10-CM | POA: Diagnosis not present

## 2015-07-13 DIAGNOSIS — I129 Hypertensive chronic kidney disease with stage 1 through stage 4 chronic kidney disease, or unspecified chronic kidney disease: Secondary | ICD-10-CM | POA: Diagnosis not present

## 2015-07-13 DIAGNOSIS — R262 Difficulty in walking, not elsewhere classified: Secondary | ICD-10-CM | POA: Diagnosis not present

## 2015-07-13 DIAGNOSIS — S52561D Barton's fracture of right radius, subsequent encounter for closed fracture with routine healing: Secondary | ICD-10-CM | POA: Diagnosis not present

## 2015-07-13 DIAGNOSIS — M1990 Unspecified osteoarthritis, unspecified site: Secondary | ICD-10-CM | POA: Diagnosis not present

## 2015-07-13 DIAGNOSIS — F329 Major depressive disorder, single episode, unspecified: Secondary | ICD-10-CM | POA: Diagnosis not present

## 2015-07-13 DIAGNOSIS — N183 Chronic kidney disease, stage 3 (moderate): Secondary | ICD-10-CM | POA: Diagnosis not present

## 2015-07-13 DIAGNOSIS — D631 Anemia in chronic kidney disease: Secondary | ICD-10-CM | POA: Diagnosis not present

## 2015-07-13 DIAGNOSIS — Z7902 Long term (current) use of antithrombotics/antiplatelets: Secondary | ICD-10-CM | POA: Diagnosis not present

## 2015-07-13 DIAGNOSIS — F0390 Unspecified dementia without behavioral disturbance: Secondary | ICD-10-CM | POA: Diagnosis not present

## 2015-07-19 ENCOUNTER — Encounter: Payer: Self-pay | Admitting: Internal Medicine

## 2015-07-19 ENCOUNTER — Ambulatory Visit (INDEPENDENT_AMBULATORY_CARE_PROVIDER_SITE_OTHER): Payer: Medicare Other | Admitting: Internal Medicine

## 2015-07-19 VITALS — BP 154/84 | HR 92 | Temp 98.7°F | Resp 18 | Wt 116.0 lb

## 2015-07-19 DIAGNOSIS — I129 Hypertensive chronic kidney disease with stage 1 through stage 4 chronic kidney disease, or unspecified chronic kidney disease: Secondary | ICD-10-CM | POA: Diagnosis not present

## 2015-07-19 DIAGNOSIS — I69359 Hemiplegia and hemiparesis following cerebral infarction affecting unspecified side: Secondary | ICD-10-CM

## 2015-07-19 DIAGNOSIS — R262 Difficulty in walking, not elsewhere classified: Secondary | ICD-10-CM | POA: Diagnosis not present

## 2015-07-19 DIAGNOSIS — E785 Hyperlipidemia, unspecified: Secondary | ICD-10-CM

## 2015-07-19 DIAGNOSIS — E119 Type 2 diabetes mellitus without complications: Secondary | ICD-10-CM

## 2015-07-19 DIAGNOSIS — F0390 Unspecified dementia without behavioral disturbance: Secondary | ICD-10-CM | POA: Diagnosis not present

## 2015-07-19 DIAGNOSIS — F329 Major depressive disorder, single episode, unspecified: Secondary | ICD-10-CM | POA: Diagnosis not present

## 2015-07-19 DIAGNOSIS — I69354 Hemiplegia and hemiparesis following cerebral infarction affecting left non-dominant side: Secondary | ICD-10-CM | POA: Diagnosis not present

## 2015-07-19 DIAGNOSIS — Z794 Long term (current) use of insulin: Secondary | ICD-10-CM | POA: Diagnosis not present

## 2015-07-19 DIAGNOSIS — Z7902 Long term (current) use of antithrombotics/antiplatelets: Secondary | ICD-10-CM | POA: Diagnosis not present

## 2015-07-19 DIAGNOSIS — D631 Anemia in chronic kidney disease: Secondary | ICD-10-CM | POA: Diagnosis not present

## 2015-07-19 DIAGNOSIS — S52561D Barton's fracture of right radius, subsequent encounter for closed fracture with routine healing: Secondary | ICD-10-CM | POA: Diagnosis not present

## 2015-07-19 DIAGNOSIS — E1122 Type 2 diabetes mellitus with diabetic chronic kidney disease: Secondary | ICD-10-CM | POA: Diagnosis not present

## 2015-07-19 DIAGNOSIS — Z9181 History of falling: Secondary | ICD-10-CM | POA: Diagnosis not present

## 2015-07-19 DIAGNOSIS — I10 Essential (primary) hypertension: Secondary | ICD-10-CM

## 2015-07-19 DIAGNOSIS — M1990 Unspecified osteoarthritis, unspecified site: Secondary | ICD-10-CM | POA: Diagnosis not present

## 2015-07-19 DIAGNOSIS — N183 Chronic kidney disease, stage 3 (moderate): Secondary | ICD-10-CM | POA: Diagnosis not present

## 2015-07-19 NOTE — Assessment & Plan Note (Signed)
Last A1c 6.5% 2 months ago Sugars well controlled at home Check A1c at next visit  Continue current medications

## 2015-07-19 NOTE — Progress Notes (Signed)
Pre visit review using our clinic review tool, if applicable. No additional management support is needed unless otherwise documented below in the visit note. 

## 2015-07-19 NOTE — Progress Notes (Signed)
Subjective:    Patient ID: Patricia Andrews, female    DOB: 1925/11/06, 81 y.o.   MRN: [id number]  HPI  She fell on 9/17 at home.  She took something out of the microwave and had it on a plate.  It started to fall and she tried to catch it.  She has left sided paralysis since her stroke and when trying to catch the plate she lost her balance and fell.  She has had several other falls at home due to losing her balance in the past few years.  She broke her right wrist.  She went to rehab and is currently living with her daughter.  She eventually once daily. She was living in independent living facility. Currently she is not ready to go back home. She just started walking recently and is walking with assistance. She was in a wheelchair until weeks ago. She is not able to close. She is not able to dress herself without assistance and is able to cook for herself.  She is doing home physical therapy and occupational therapy.  When she was at previously she had some unexplained to her home medications and Fridays for 2 hours and her daughter feels this will not be enough. She does not have the energy or the ability to walk the grocery store. She is able to regular checks. Her daughter has some concerns regarding her tremor. She does have some hearing loss, but declined hearing aids.  She is doing well at her daughter's house, but still wants to be independent. Her daughter states she is compliant with a diabetic diet at her house and has gained some weight back that she lost.  While at rehabilitation she racing arrhythmia. She was placed on Cardizem, but her daughter states they did not start the medication. They did tell her that she will need to stop the Norvasc, which she is currently taking. She wanted to know if she should make that change. Cardiac denies any chest pain or palpitations. Denies any lower extremity edema.  Diabetes: She is taking her medication daily as prescribed. She has been very diabetic  diet. Her sugars have been 64-153 bring her sugar level. Her sugars have only been 200 twice.  She has NovoLog insulin take as needed, but has only needed to do that once or twice.     Hypertension: She is taking her medication daily. She is compliant with a low sodium diet.  She denies chest pain, palpitations, edema, shortness of breath and regular headaches. She is not exercising regularly.  She does not monitor her blood pressure at home.    Hyperlipidemia: She is taking her medication daily. She is compliant with a low fat/cholesterol diet. She is not exercising regularly. She denies myalgias.     Medications and allergies reviewed with patient and updated if appropriate.  Patient Active Problem List   Diagnosis Date Noted  . Distal radius fracture   . Right wrist fracture 05/14/2015  . Onychomycosis 02/04/2013  . Pain due to onychomycosis of toenail 02/04/2013  . Diabetes type 2, controlled (HCC) 09/20/2011  . Hypertension 09/20/2011  . Hyperlipidemia 09/20/2011  . Senile osteoporosis 09/20/2011  . Depression with anxiety 09/20/2011  . Anemia 09/20/2011  . CVA, old, hemiparesis (HCC)     Current Outpatient Prescriptions on File Prior to Visit  Medication Sig Dispense Refill  . amLODipine (NORVASC) 5 MG tablet Take 1 tablet (5 mg total) by mouth daily. 90 tablet 3  . bisacodyl (  DULCOLAX) 5 MG EC tablet Take 5 mg by mouth daily as needed. For constipation.    . cholecalciferol (VITAMIN D) 1000 UNITS tablet Take 1,000 Units by mouth daily.    . citalopram (CELEXA) 20 MG tablet Take 1 tablet (20 mg total) by mouth daily. 90 tablet 3  . clopidogrel (PLAVIX) 75 MG tablet Take 1 tablet (75 mg total) by mouth daily with breakfast. 90 tablet 3  . denosumab (PROLIA) 60 MG/ML SOLN injection Inject 60 mg into the skin every 6 (six) months. Administer in upper arm, thigh, or abdomen    . feeding supplement, ENSURE ENLIVE, (ENSURE ENLIVE) LIQD Take 237 mLs by mouth 2 (two) times daily  between meals. 237 mL 12  . ferrous sulfate 325 (65 FE) MG tablet Take 325 mg by mouth daily with breakfast.    . glimepiride (AMARYL) 2 MG tablet Take 1 tablet (2 mg total) by mouth daily with breakfast. 90 tablet 2  . mirtazapine (REMERON) 15 MG tablet Take 1 tablet (15 mg total) by mouth at bedtime. 90 tablet 3  . Omega-3 Fatty Acids (FISH OIL PO) Take 1 capsule by mouth every morning.    . simvastatin (ZOCOR) 20 MG tablet Take 1 tablet (20 mg total) by mouth at bedtime. 90 tablet 3  . traMADol (ULTRAM) 50 MG tablet Take 1 tablet (50 mg total) by mouth every 6 (six) hours as needed. 15 tablet 0  . zolpidem (AMBIEN) 5 MG tablet Take 1 tablet (5 mg total) by mouth at bedtime as needed. For sleep. 30 tablet 3   No current facility-administered medications on file prior to visit.    Past Medical History  Diagnosis Date  . Stroke [facility]) 1979    R MCA>>residual spastic L HP; TIA 06/2011  . Diabetes mellitus, type 2 (HCC)   . Hyperlipidemia   . Hypertension   . Arthritis   . Depression     anxiety overlap  . Allergic rhinitis   . Anemia   . Osteoporosis, senile     accidental fall w/ pelvic fx 2006, L wrist fx 10/2013  . Pelvis fracture [facility])     Past Surgical History  Procedure Laterality Date  . Abdominal hysterectomy  1960    Fibroid tumors  . Colonoscopy  06/06/2012    Procedure: COLONOSCOPY;  Surgeon: Louis Meckel, MD;  Location: [facility] ENDOSCOPY;  Service: Endoscopy;  Laterality: N/A;  Rm 1501. Pt to be discharged after procedure. Patient was only admitted for prep.    Social History   Social History  . Marital Status: Single    Spouse Name: N/A  . Number of Children: 2  . Years of Education: N/A   Occupational History  . RETIRED    Social History Main Topics  . Smoking status: Never Smoker   . Smokeless tobacco: Never Used  . Alcohol Use: No  . Drug Use: No  . Sexual Activity: No   Other Topics Concern  . None   Social History Narrative   Lives alone in  independent living, single. Never married   Supportive daughter [name] -    Review of Systems  Constitutional: Negative for fever and chills.  HENT: Positive for ear pain (left ear discomfort - excessive wax since by home nurse) and hearing loss.   Respiratory: Negative for cough, shortness of breath and wheezing.   Cardiovascular: Negative for chest pain, palpitations and leg swelling.  Gastrointestinal: Negative for nausea, abdominal pain, diarrhea and constipation.  Genitourinary: Negative for  dysuria.  Neurological: Negative for dizziness, light-headedness and headaches.       Objective:   Filed Vitals:   07/19/15 0948  BP: 154/84  Pulse: 92  Temp: 98.7 F (37.1 C)  Resp: 18   Filed Weights   07/19/15 0948  Weight: 116 lb (52.617 kg)   Body mass index is 21.93 kg/(m^2).   Physical Exam  Constitutional: She appears well-developed and well-nourished. No distress.  HENT:  Head: Normocephalic and atraumatic.  Right Ear: External ear normal.  Left Ear: External ear normal.  Right ear canal with minimal cerumen, left ear canal with large amount of cerumen-close to completely obstructing the ear canal  Neck: Neck supple. No tracheal deviation present. No thyromegaly present.  Cardiovascular: Normal rate, regular rhythm and normal heart sounds.   No murmur heard. Pulmonary/Chest: Effort normal and breath sounds normal. No respiratory distress. She has no wheezes. She has no rales.  Abdominal: Soft. She exhibits no distension. There is no tenderness.  Musculoskeletal: She exhibits no edema.  Lymphadenopathy:    She has no cervical adenopathy.  Neurological:  Unable to move left arm and leg  Psychiatric: She has a normal mood and affect. Her behavior is normal.    Ear lavage done by CMA.        Assessment & Plan:   See Problem List.

## 2015-07-19 NOTE — Assessment & Plan Note (Signed)
Blood pressure elevated here today, but her daughter states it is better controlled at home We will change Norvasc to Cardizem given her elevated heart rate and arrhythmia and V. tach They will monitor her blood pressure and heart rate closely

## 2015-07-19 NOTE — Assessment & Plan Note (Signed)
On plavix Not on Aspirin as well because of history of anemia with GI bleed Chronic left hemiparesis Balance and gait difficulty at times, frequent falls Wears a left leg brace and is able to ambulate-improving with physical therapy since her recent hospitalization

## 2015-07-19 NOTE — Assessment & Plan Note (Signed)
Taking simvastatin daily Eating  a healthy diet Recheck lipid panel, CMP next visit

## 2015-07-19 NOTE — Patient Instructions (Signed)
   Medications reviewed and updated.  Start taking the cardizem.  Stop the norvasc.  Monitor your blood pressure and heart rate at home.

## 2015-07-20 DIAGNOSIS — F0390 Unspecified dementia without behavioral disturbance: Secondary | ICD-10-CM | POA: Diagnosis not present

## 2015-07-20 DIAGNOSIS — Z9181 History of falling: Secondary | ICD-10-CM | POA: Diagnosis not present

## 2015-07-20 DIAGNOSIS — D631 Anemia in chronic kidney disease: Secondary | ICD-10-CM | POA: Diagnosis not present

## 2015-07-20 DIAGNOSIS — E785 Hyperlipidemia, unspecified: Secondary | ICD-10-CM | POA: Diagnosis not present

## 2015-07-20 DIAGNOSIS — F329 Major depressive disorder, single episode, unspecified: Secondary | ICD-10-CM | POA: Diagnosis not present

## 2015-07-20 DIAGNOSIS — Z794 Long term (current) use of insulin: Secondary | ICD-10-CM | POA: Diagnosis not present

## 2015-07-20 DIAGNOSIS — N183 Chronic kidney disease, stage 3 (moderate): Secondary | ICD-10-CM | POA: Diagnosis not present

## 2015-07-20 DIAGNOSIS — M1990 Unspecified osteoarthritis, unspecified site: Secondary | ICD-10-CM | POA: Diagnosis not present

## 2015-07-20 DIAGNOSIS — S52561D Barton's fracture of right radius, subsequent encounter for closed fracture with routine healing: Secondary | ICD-10-CM | POA: Diagnosis not present

## 2015-07-20 DIAGNOSIS — Z7902 Long term (current) use of antithrombotics/antiplatelets: Secondary | ICD-10-CM | POA: Diagnosis not present

## 2015-07-20 DIAGNOSIS — I69354 Hemiplegia and hemiparesis following cerebral infarction affecting left non-dominant side: Secondary | ICD-10-CM | POA: Diagnosis not present

## 2015-07-20 DIAGNOSIS — I129 Hypertensive chronic kidney disease with stage 1 through stage 4 chronic kidney disease, or unspecified chronic kidney disease: Secondary | ICD-10-CM | POA: Diagnosis not present

## 2015-07-20 DIAGNOSIS — E1122 Type 2 diabetes mellitus with diabetic chronic kidney disease: Secondary | ICD-10-CM | POA: Diagnosis not present

## 2015-07-20 DIAGNOSIS — R262 Difficulty in walking, not elsewhere classified: Secondary | ICD-10-CM | POA: Diagnosis not present

## 2015-07-22 DIAGNOSIS — G8190 Hemiplegia, unspecified affecting unspecified side: Secondary | ICD-10-CM | POA: Diagnosis not present

## 2015-07-22 DIAGNOSIS — M6281 Muscle weakness (generalized): Secondary | ICD-10-CM | POA: Diagnosis not present

## 2015-07-22 DIAGNOSIS — I69359 Hemiplegia and hemiparesis following cerebral infarction affecting unspecified side: Secondary | ICD-10-CM | POA: Diagnosis not present

## 2015-08-02 ENCOUNTER — Telehealth: Payer: Self-pay | Admitting: Internal Medicine

## 2015-08-02 NOTE — Telephone Encounter (Signed)
Please advise 

## 2015-08-02 NOTE — Telephone Encounter (Signed)
Clydie BraunKaren from [facility] is requesting verbal orders for OT - 1 x wk for 2 wks She can be [phone]

## 2015-08-02 NOTE — Telephone Encounter (Signed)
Spoke with Clydie BraunKaren to inform ok for verbal orders.

## 2015-08-02 NOTE — Telephone Encounter (Signed)
ok 

## 2015-08-03 DIAGNOSIS — Z794 Long term (current) use of insulin: Secondary | ICD-10-CM | POA: Diagnosis not present

## 2015-08-03 DIAGNOSIS — S52561D Barton's fracture of right radius, subsequent encounter for closed fracture with routine healing: Secondary | ICD-10-CM | POA: Diagnosis not present

## 2015-08-03 DIAGNOSIS — Z7902 Long term (current) use of antithrombotics/antiplatelets: Secondary | ICD-10-CM | POA: Diagnosis not present

## 2015-08-03 DIAGNOSIS — F0390 Unspecified dementia without behavioral disturbance: Secondary | ICD-10-CM | POA: Diagnosis not present

## 2015-08-03 DIAGNOSIS — E785 Hyperlipidemia, unspecified: Secondary | ICD-10-CM | POA: Diagnosis not present

## 2015-08-03 DIAGNOSIS — E1122 Type 2 diabetes mellitus with diabetic chronic kidney disease: Secondary | ICD-10-CM | POA: Diagnosis not present

## 2015-08-03 DIAGNOSIS — I129 Hypertensive chronic kidney disease with stage 1 through stage 4 chronic kidney disease, or unspecified chronic kidney disease: Secondary | ICD-10-CM | POA: Diagnosis not present

## 2015-08-03 DIAGNOSIS — I69354 Hemiplegia and hemiparesis following cerebral infarction affecting left non-dominant side: Secondary | ICD-10-CM | POA: Diagnosis not present

## 2015-08-03 DIAGNOSIS — N183 Chronic kidney disease, stage 3 (moderate): Secondary | ICD-10-CM | POA: Diagnosis not present

## 2015-08-03 DIAGNOSIS — Z9181 History of falling: Secondary | ICD-10-CM | POA: Diagnosis not present

## 2015-08-03 DIAGNOSIS — R262 Difficulty in walking, not elsewhere classified: Secondary | ICD-10-CM | POA: Diagnosis not present

## 2015-08-03 DIAGNOSIS — F329 Major depressive disorder, single episode, unspecified: Secondary | ICD-10-CM | POA: Diagnosis not present

## 2015-08-03 DIAGNOSIS — M1990 Unspecified osteoarthritis, unspecified site: Secondary | ICD-10-CM | POA: Diagnosis not present

## 2015-08-03 DIAGNOSIS — D631 Anemia in chronic kidney disease: Secondary | ICD-10-CM | POA: Diagnosis not present

## 2015-08-12 DIAGNOSIS — R262 Difficulty in walking, not elsewhere classified: Secondary | ICD-10-CM | POA: Diagnosis not present

## 2015-08-12 DIAGNOSIS — Z9181 History of falling: Secondary | ICD-10-CM | POA: Diagnosis not present

## 2015-08-12 DIAGNOSIS — D631 Anemia in chronic kidney disease: Secondary | ICD-10-CM | POA: Diagnosis not present

## 2015-08-12 DIAGNOSIS — Z794 Long term (current) use of insulin: Secondary | ICD-10-CM | POA: Diagnosis not present

## 2015-08-12 DIAGNOSIS — I129 Hypertensive chronic kidney disease with stage 1 through stage 4 chronic kidney disease, or unspecified chronic kidney disease: Secondary | ICD-10-CM | POA: Diagnosis not present

## 2015-08-12 DIAGNOSIS — F329 Major depressive disorder, single episode, unspecified: Secondary | ICD-10-CM | POA: Diagnosis not present

## 2015-08-12 DIAGNOSIS — N183 Chronic kidney disease, stage 3 (moderate): Secondary | ICD-10-CM | POA: Diagnosis not present

## 2015-08-12 DIAGNOSIS — E785 Hyperlipidemia, unspecified: Secondary | ICD-10-CM | POA: Diagnosis not present

## 2015-08-12 DIAGNOSIS — I69354 Hemiplegia and hemiparesis following cerebral infarction affecting left non-dominant side: Secondary | ICD-10-CM | POA: Diagnosis not present

## 2015-08-12 DIAGNOSIS — F0390 Unspecified dementia without behavioral disturbance: Secondary | ICD-10-CM | POA: Diagnosis not present

## 2015-08-12 DIAGNOSIS — E1122 Type 2 diabetes mellitus with diabetic chronic kidney disease: Secondary | ICD-10-CM | POA: Diagnosis not present

## 2015-08-12 DIAGNOSIS — S52561D Barton's fracture of right radius, subsequent encounter for closed fracture with routine healing: Secondary | ICD-10-CM | POA: Diagnosis not present

## 2015-08-12 DIAGNOSIS — M1990 Unspecified osteoarthritis, unspecified site: Secondary | ICD-10-CM | POA: Diagnosis not present

## 2015-08-12 DIAGNOSIS — Z7902 Long term (current) use of antithrombotics/antiplatelets: Secondary | ICD-10-CM | POA: Diagnosis not present

## 2015-08-17 ENCOUNTER — Telehealth: Payer: Self-pay | Admitting: Emergency Medicine

## 2015-08-17 NOTE — Telephone Encounter (Signed)
Treatment plan from [facility] has been mailed.

## 2015-08-18 DIAGNOSIS — Z794 Long term (current) use of insulin: Secondary | ICD-10-CM | POA: Diagnosis not present

## 2015-08-18 DIAGNOSIS — E785 Hyperlipidemia, unspecified: Secondary | ICD-10-CM | POA: Diagnosis not present

## 2015-08-18 DIAGNOSIS — I69354 Hemiplegia and hemiparesis following cerebral infarction affecting left non-dominant side: Secondary | ICD-10-CM | POA: Diagnosis not present

## 2015-08-18 DIAGNOSIS — I129 Hypertensive chronic kidney disease with stage 1 through stage 4 chronic kidney disease, or unspecified chronic kidney disease: Secondary | ICD-10-CM | POA: Diagnosis not present

## 2015-08-18 DIAGNOSIS — F329 Major depressive disorder, single episode, unspecified: Secondary | ICD-10-CM | POA: Diagnosis not present

## 2015-08-18 DIAGNOSIS — N183 Chronic kidney disease, stage 3 (moderate): Secondary | ICD-10-CM | POA: Diagnosis not present

## 2015-08-18 DIAGNOSIS — S52561D Barton's fracture of right radius, subsequent encounter for closed fracture with routine healing: Secondary | ICD-10-CM | POA: Diagnosis not present

## 2015-08-18 DIAGNOSIS — F0390 Unspecified dementia without behavioral disturbance: Secondary | ICD-10-CM | POA: Diagnosis not present

## 2015-08-18 DIAGNOSIS — Z7902 Long term (current) use of antithrombotics/antiplatelets: Secondary | ICD-10-CM | POA: Diagnosis not present

## 2015-08-18 DIAGNOSIS — D631 Anemia in chronic kidney disease: Secondary | ICD-10-CM | POA: Diagnosis not present

## 2015-08-18 DIAGNOSIS — R262 Difficulty in walking, not elsewhere classified: Secondary | ICD-10-CM | POA: Diagnosis not present

## 2015-08-18 DIAGNOSIS — M1990 Unspecified osteoarthritis, unspecified site: Secondary | ICD-10-CM | POA: Diagnosis not present

## 2015-08-18 DIAGNOSIS — E1122 Type 2 diabetes mellitus with diabetic chronic kidney disease: Secondary | ICD-10-CM | POA: Diagnosis not present

## 2015-08-18 DIAGNOSIS — Z9181 History of falling: Secondary | ICD-10-CM | POA: Diagnosis not present

## 2015-08-23 ENCOUNTER — Telehealth: Payer: Self-pay

## 2015-08-23 MED ORDER — BACLOFEN 10 MG PO TABS: 10.0000 mg | ORAL_TABLET | Freq: Three times a day (TID) | ORAL | Status: DC

## 2015-08-23 NOTE — Telephone Encounter (Signed)
Patients daughter, hazel Laural Benesjohnson states that her mother has previously used a muscle relaxer that dr Felicity Coyerleschber had prescribed for pulled muscle---she has now pulled another muscle and wanted to know if you would refill without an office visit, please advise, i will call hazel back at [phone]--[facility]

## 2015-08-23 NOTE — Telephone Encounter (Signed)
Spoke with pts daughter to inform. Pts daughter stated that pt was sitting on the bedside and was too close to the edge and fell to the floor she tried pulling herself up without calling for help and they seem to think this is when she pulled/strained a muscle. Pts daughter wanted to make us aware of this.

## 2015-08-23 NOTE — Telephone Encounter (Signed)
Will try a muscle relaxer - will try a different one than she used last time.  It can cause some drowsiness or dizziness so she needs to be aware of this.

## 2015-09-07 DIAGNOSIS — E119 Type 2 diabetes mellitus without complications: Secondary | ICD-10-CM | POA: Diagnosis not present

## 2015-10-17 ENCOUNTER — Ambulatory Visit: Payer: Self-pay | Admitting: Internal Medicine

## 2015-11-08 ENCOUNTER — Telehealth: Payer: Self-pay

## 2015-11-08 MED ORDER — DILTIAZEM HCL 120 MG PO TABS: 120.0000 mg | ORAL_TABLET | Freq: Every day | ORAL | Status: DC

## 2015-11-08 NOTE — Telephone Encounter (Signed)
Recd faxed refill request for cardezem 120mg  tab---per dr burns, patient is using this med---even though it was showing never sent in by us--(see dr burns office note)---patient got first rx from rehab and still had some of med at home she was going to use up before we refilled---per dr burns ok to refill---i [facility]

## 2015-11-10 MED ORDER — DILTIAZEM HCL ER COATED BEADS 120 MG PO CP24: 120.0000 mg | ORAL_CAPSULE | Freq: Every day | ORAL | Status: DC

## 2015-11-10 NOTE — Telephone Encounter (Signed)
Received call from [facility] wanting to clarify if pt suppose to be taking the regular Cardizem or Cardizem CD last rx was filled for the extended release, normally the extended release is taking once daily. Pls advise...Raechel Chute/lmb

## 2015-11-10 NOTE — Telephone Encounter (Signed)
[facility] called and states pt has done the extended release tab before and this prescription is immediate release. Pharmacy wanted to check to be sure this is correct.  She also said there is a possible drug interaction.  Ref # [id number]

## 2015-11-10 NOTE — Telephone Encounter (Signed)
Notified [facility] with md. Updated med list.../lmb

## 2015-11-10 NOTE — Addendum Note (Signed)
Addended by: Deatra JamesBRAND, Ilisa Hayworth M on: 11/10/2015 11:30 AM   Modules accepted: Orders, Medications

## 2015-11-10 NOTE — Telephone Encounter (Signed)
Should be extended release - once daily

## 2015-12-08 ENCOUNTER — Telehealth: Payer: Self-pay | Admitting: Internal Medicine

## 2015-12-08 MED ORDER — DILTIAZEM HCL ER COATED BEADS 120 MG PO CP24: 120.0000 mg | ORAL_CAPSULE | Freq: Every day | ORAL | Status: DC

## 2015-12-08 NOTE — Telephone Encounter (Signed)
Spoke with daughter to inform. Offered 30 day supply to local and she stated that she did not need a refill at the moment.

## 2015-12-08 NOTE — Telephone Encounter (Signed)
Pt's daughter called stated that [facility] did not get the rx for diltiazem (CARDIZEM CD) 120 MG that we send in 11/10/15. Please check and call them back, she is out of this medication.

## 2015-12-14 ENCOUNTER — Other Ambulatory Visit: Payer: Self-pay | Admitting: Internal Medicine

## 2015-12-15 ENCOUNTER — Telehealth: Payer: Self-pay

## 2015-12-15 NOTE — Telephone Encounter (Signed)
Left message advising patient her prolia injection is due now---insurance has been verified and estimated cost of $250.00 copay---if patient calls back, please schedule prolia injection on nurse visit for anytime convenient with patient

## 2016-01-13 ENCOUNTER — Other Ambulatory Visit: Payer: Self-pay | Admitting: Internal Medicine

## 2016-01-16 ENCOUNTER — Ambulatory Visit (INDEPENDENT_AMBULATORY_CARE_PROVIDER_SITE_OTHER): Payer: Medicare Other | Admitting: Internal Medicine

## 2016-01-16 ENCOUNTER — Encounter: Payer: Self-pay | Admitting: Internal Medicine

## 2016-01-16 ENCOUNTER — Other Ambulatory Visit (INDEPENDENT_AMBULATORY_CARE_PROVIDER_SITE_OTHER): Payer: Medicare Other

## 2016-01-16 VITALS — BP 114/70 | HR 87 | Temp 98.6°F | Ht 64.0 in | Wt 104.0 lb

## 2016-01-16 DIAGNOSIS — I10 Essential (primary) hypertension: Secondary | ICD-10-CM

## 2016-01-16 DIAGNOSIS — D649 Anemia, unspecified: Secondary | ICD-10-CM

## 2016-01-16 DIAGNOSIS — E119 Type 2 diabetes mellitus without complications: Secondary | ICD-10-CM

## 2016-01-16 DIAGNOSIS — M810 Age-related osteoporosis without current pathological fracture: Secondary | ICD-10-CM

## 2016-01-16 DIAGNOSIS — R5382 Chronic fatigue, unspecified: Secondary | ICD-10-CM | POA: Insufficient documentation

## 2016-01-16 LAB — CBC WITH DIFFERENTIAL/PLATELET
BASOS PCT: 0.6 % (ref 0.0–3.0)
Basophils Absolute: 0 10*3/uL (ref 0.0–0.1)
Eosinophils Absolute: 0.1 10*3/uL (ref 0.0–0.7)
Eosinophils Relative: 1.8 % (ref 0.0–5.0)
HEMATOCRIT: 30.4 % — AB (ref 36.0–46.0)
Hemoglobin: 9.9 g/dL — ABNORMAL LOW (ref 12.0–15.0)
LYMPHS PCT: 40.6 % (ref 12.0–46.0)
Lymphs Abs: 1.8 10*3/uL (ref 0.7–4.0)
MCHC: 32.7 g/dL (ref 30.0–36.0)
MCV: 94.2 fl (ref 78.0–100.0)
MONOS PCT: 8.4 % (ref 3.0–12.0)
Monocytes Absolute: 0.4 10*3/uL (ref 0.1–1.0)
NEUTROS ABS: 2.1 10*3/uL (ref 1.4–7.7)
Neutrophils Relative %: 48.6 % (ref 43.0–77.0)
PLATELETS: 220 10*3/uL (ref 150.0–400.0)
RBC: 3.22 Mil/uL — ABNORMAL LOW (ref 3.87–5.11)
RDW: 14.4 % (ref 11.5–15.5)
WBC: 4.4 10*3/uL (ref 4.0–10.5)

## 2016-01-16 LAB — COMPREHENSIVE METABOLIC PANEL
ALT: 7 U/L (ref 0–35)
AST: 13 U/L (ref 0–37)
Albumin: 4.4 g/dL (ref 3.5–5.2)
Alkaline Phosphatase: 49 U/L (ref 39–117)
BUN: 28 mg/dL — ABNORMAL HIGH (ref 6–23)
CHLORIDE: 107 meq/L (ref 96–112)
CO2: 25 meq/L (ref 19–32)
Calcium: 9.2 mg/dL (ref 8.4–10.5)
Creatinine, Ser: 1.38 mg/dL — ABNORMAL HIGH (ref 0.40–1.20)
GFR: 46.4 mL/min — AB (ref >60.00)
GLUCOSE: 62 mg/dL — AB (ref 70–99)
POTASSIUM: 4.9 meq/L (ref 3.5–5.1)
Sodium: 139 mEq/L (ref 135–145)
Total Bilirubin: 0.3 mg/dL (ref 0.2–1.2)
Total Protein: 7.3 g/dL (ref 6.0–8.3)

## 2016-01-16 LAB — TSH: TSH: 2.04 u[IU]/mL (ref 0.35–4.50)

## 2016-01-16 LAB — FERRITIN: Ferritin: 57.4 ng/mL (ref 10.0–291.0)

## 2016-01-16 LAB — HEMOGLOBIN A1C: Hgb A1c MFr Bld: 5.6 % (ref 4.6–6.5)

## 2016-01-16 LAB — VITAMIN B12: Vitamin B-12: >1500 pg/mL — ABNORMAL HIGH (ref 211–911)

## 2016-01-16 LAB — IRON: IRON: 61 ug/dL (ref 42–145)

## 2016-01-16 MED ORDER — CITALOPRAM HYDROBROMIDE 20 MG PO TABS: 20.0000 mg | ORAL_TABLET | Freq: Every day | ORAL | Status: DC

## 2016-01-16 MED ORDER — INSULIN ASPART 100 UNIT/ML FLEXPEN: PEN_INJECTOR | SUBCUTANEOUS | Status: DC

## 2016-01-16 MED ORDER — TRIAMCINOLONE ACETONIDE 0.025 % EX OINT: 1.0000 "application " | TOPICAL_OINTMENT | Freq: Two times a day (BID) | CUTANEOUS | Status: DC

## 2016-01-16 MED ORDER — DENOSUMAB 60 MG/ML ~~LOC~~ SOLN
60.0000 mg | Freq: Once | SUBCUTANEOUS | Status: AC
  Administered 2016-01-16: 60 mg via SUBCUTANEOUS

## 2016-01-16 NOTE — Assessment & Plan Note (Signed)
Sugars have been controlled at home, except occasional elevated sugar depending on what she eats Check A1c We will prescribe a short acting insulin to use only if needed for elevated sugars

## 2016-01-16 NOTE — Assessment & Plan Note (Signed)
BP well controlled Current regimen effective and well tolerated Continue current medications at current doses  

## 2016-01-16 NOTE — Assessment & Plan Note (Signed)
Prolia injection today Continue vitamin D 

## 2016-01-16 NOTE — Assessment & Plan Note (Signed)
Fatigue, possibly shortness of breath with exertion. Decreased exercise tolerance Will check echocardiogram Check blood work Denies chest pain, palpitations, lightheadedness Possibly multifactorial

## 2016-01-16 NOTE — Patient Instructions (Signed)
  Test(s) ordered today. Your results will be released to MyChart (or called to you) after review, usually within 72hours after test completion. If any changes need to be made, you will be notified at that same time.   Medications reviewed and updated.  Changes include a steroid cream for your eczema and increasing the celexa to 20 mg daily.   Your prescription(s) have been submitted to your pharmacy. Please take as directed and contact our office if you believe you are having problem(s) with the medication(s).  A Echo has been ordered  Please followup in 6 months

## 2016-01-16 NOTE — Assessment & Plan Note (Signed)
GI evaluation 05/2012 unremarkable No symptoms consistent with GI bleed Chronic No longer taking iron supplementation Check CBC, iron, ferritin Possibly related to chronic kidney disease

## 2016-01-16 NOTE — Progress Notes (Signed)
Pre visit review using our clinic review tool, if applicable. No additional management support is needed unless otherwise documented below in the visit note. 

## 2016-01-16 NOTE — Progress Notes (Signed)
Subjective:    Patient ID: Patricia Andrews, female    DOB: 09/19/1925, 82 y.o.   MRN: [id number]  HPI She is here for follow up.  She is here with her 2 daughters, one of which she lives with.  Since she was here last she did try to go home and live on her own, but that did not go well and she is back living with her daughter. She knows now that she is not able to live on her own.  Diabetes: She is taking her medication daily as prescribed. She is compliant with a diabetic diet. She is not exercising regularly. She monitors her sugars and they have been well controlled. On occasion she will have an elevated sugar depending on what she eats. They would like insulin sliding scale-this was prescribed to her when she left the hospital in the fall, but she did not have the prescription filled.   Hypertension: She is taking her medication daily. She is compliant with a low sodium diet.  She denies chest pain, palpitations, edema, and regular headaches. She is not exercising regularly and is very sedentary.  She does not monitor her blood pressure at home.    Hyperlipidemia: She is taking her medication daily. She is compliant with a low fat/cholesterol diet. She denies myalgias.   Osteoporosis:  She is taking her vitamin D daily. She does not exercise and is very sedentary. She just received her Prolene injection today.  Depression: She is taking her medication daily as prescribed. She denies any side effects from the medication. She feels her depression is well controlled and she is happy with her current dose of medication.   Shortness of breath/fatigue with exertion: She has poor exercise tolerance and is very sedentary. After walking about 10 steps she needs to stop and rest. She is unsure if this is more from fatigue or shortness of breath. She thinks it may be more related to fatigue and shortness of breath. She denies any symptoms at rest. She denies any chest pain or  palpitations.   Medications and allergies reviewed with patient and updated if appropriate.  Patient Active Problem List   Diagnosis Date Noted  . Distal radius fracture   . Right wrist fracture 05/14/2015  . Onychomycosis 02/04/2013  . Pain due to onychomycosis of toenail 02/04/2013  . Diabetes type 2, controlled (HCC) 09/20/2011  . Hypertension 09/20/2011  . Hyperlipidemia 09/20/2011  . Senile osteoporosis 09/20/2011  . Depression with anxiety 09/20/2011  . Anemia 09/20/2011  . CVA, old, hemiparesis (HCC)     Current Outpatient Prescriptions on File Prior to Visit  Medication Sig Dispense Refill  . baclofen (LIORESAL) 10 MG tablet Take 1 tablet (10 mg total) by mouth 3 (three) times daily. 30 each 0  . bisacodyl (DULCOLAX) 5 MG EC tablet Take 5 mg by mouth daily as needed. For constipation.    . cholecalciferol (VITAMIN D) 1000 UNITS tablet Take 1,000 Units by mouth daily.    . clopidogrel (PLAVIX) 75 MG tablet Take 1 tablet by mouth  daily with breakfast 90 tablet 1  . denosumab (PROLIA) 60 MG/ML SOLN injection Inject 60 mg into the skin every 6 (six) months. Administer in upper arm, thigh, or abdomen    . diltiazem (CARDIZEM CD) 120 MG 24 hr capsule Take 1 capsule (120 mg total) by mouth daily. 90 capsule 2  . feeding supplement, ENSURE ENLIVE, (ENSURE ENLIVE) LIQD Take 237 mLs by mouth 2 (two) times daily  between meals. 237 mL 12  . ferrous sulfate 325 (65 FE) MG tablet Take 325 mg by mouth daily with breakfast.    . glimepiride (AMARYL) 2 MG tablet Take 1 tablet by mouth  daily with breakfast 90 tablet 1  . insulin aspart (NOVOLOG FLEXPEN) 100 UNIT/ML FlexPen Inject into the skin 3 (three) times daily with meals.    . mirtazapine (REMERON) 15 MG tablet Take 1 tablet by mouth at  bedtime 90 tablet 1  . olmesartan (BENICAR) 20 MG tablet Take 1 tablet by mouth  daily 90 tablet 1  . Omega-3 Fatty Acids (FISH OIL PO) Take 1 capsule by mouth every morning.    . simvastatin (ZOCOR)  20 MG tablet Take 1 tablet by mouth at  bedtime 90 tablet 1  . traMADol (ULTRAM) 50 MG tablet Take 1 tablet (50 mg total) by mouth every 6 (six) hours as needed. 15 tablet 0  . zolpidem (AMBIEN) 5 MG tablet Take 1 tablet (5 mg total) by mouth at bedtime as needed. For sleep. 30 tablet 3  . citalopram (CELEXA) 20 MG tablet Take 1 tablet (20 mg total) by mouth daily. 90 tablet 3   No current facility-administered medications on file prior to visit.    Past Medical History  Diagnosis Date  . Stroke [facility]) 1979    R MCA>>residual spastic L HP; TIA 06/2011  . Diabetes mellitus, type 2 (HCC)   . Hyperlipidemia   . Hypertension   . Arthritis   . Depression     anxiety overlap  . Allergic rhinitis   . Anemia   . Osteoporosis, senile     accidental fall w/ pelvic fx 2006, L wrist fx 10/2013  . Pelvis fracture [facility])     Past Surgical History  Procedure Laterality Date  . Abdominal hysterectomy  1960    Fibroid tumors  . Colonoscopy  06/06/2012    Procedure: COLONOSCOPY;  Surgeon: Louis Meckel, MD;  Location: [facility];  Service: Endoscopy;  Laterality: N/A;  Rm 1501. Pt to be discharged after procedure. Patient was only admitted for prep.    Social History   Social History  . Marital Status: Single    Spouse Name: N/A  . Number of Children: 2  . Years of Education: N/A   Occupational History  . RETIRED    Social History Main Topics  . Smoking status: Never Smoker   . Smokeless tobacco: Never Used  . Alcohol Use: No  . Drug Use: No  . Sexual Activity: No   Other Topics Concern  . None   Social History Narrative   Lives alone in independent living, single. Never married   Supportive daughter Patricia Andrews -    Family History  Problem Relation Age of Onset  . Arthritis Mother   . Arthritis Father     Review of Systems  Constitutional: Negative for fever.  Respiratory: Positive for shortness of breath (after about 10 steps, more fatigue than true shortness of  breath). Negative for cough and wheezing.   Cardiovascular: Positive for leg swelling (mild). Negative for chest pain and palpitations.  Gastrointestinal: Negative for abdominal pain, diarrhea and constipation.  Skin: Positive for rash (eczema on inner thigh).  Neurological: Negative for light-headedness and headaches.       Objective:   Filed Vitals:   01/16/16 1113  BP: 114/70  Pulse: 87  Temp: 98.6 F (37 C)   Filed Weights   01/16/16 1113  Weight: 104 lb (47.174 kg)  Body mass index is 17.84 kg/(m^2).   Physical Exam Constitutional: Appears well-developed and well-nourished. No distress.  Neck: Neck supple. No tracheal deviation present. No thyromegaly present.  No carotid bruit. No cervical adenopathy.   Cardiovascular: Normal rate, regular rhythm and normal heart sounds.   No murmur heard.  No edema Pulmonary/Chest: Effort normal and breath sounds normal. No respiratory distress. No wheezes.         Assessment & Plan:   See Problem List for Assessment and Plan of chronic medical problems.   Follow-up in 6 months, sooner if needed

## 2016-01-17 ENCOUNTER — Telehealth: Payer: Self-pay | Admitting: *Deleted

## 2016-01-17 NOTE — Telephone Encounter (Signed)
Ok to change to [employer]

## 2016-01-17 NOTE — Telephone Encounter (Signed)
Receive fax stating Novolog Pen is non-formulary under pt insurance plan. The alternative is Humalog Kwikpen. Needing updated script...Raechel Chute/lmb

## 2016-01-17 NOTE — Telephone Encounter (Signed)
Should same dosage be the same as Novolog?

## 2016-01-17 NOTE — Telephone Encounter (Signed)
Yes, thanks

## 2016-01-18 NOTE — Telephone Encounter (Signed)
Updated & faxed clarification form back w/change...Raechel Chute/lmb

## 2016-01-28 ENCOUNTER — Encounter: Payer: Self-pay | Admitting: Emergency Medicine

## 2016-01-30 ENCOUNTER — Telehealth: Payer: Self-pay | Admitting: Emergency Medicine

## 2016-01-30 NOTE — Telephone Encounter (Signed)
Spoke with daughter of pt to give lab results. She will discuss with pt and also discuss refer to blood specialist. She will call back if the pt agrees to go.

## 2016-02-01 ENCOUNTER — Telehealth: Payer: Self-pay | Admitting: *Deleted

## 2016-02-01 MED ORDER — INSULIN PEN NEEDLE 32G X 4 MM MISC: Status: AC

## 2016-02-01 MED ORDER — INSULIN PEN NEEDLE 32G X 4 MM MISC: Status: DC

## 2016-02-01 NOTE — Telephone Encounter (Signed)
Notified pt sent rx to mail service, also sent to local pharmacy so she can take insulin...Patricia Andrews/lmb

## 2016-02-01 NOTE — Telephone Encounter (Signed)
Daughter left msg on triage stating mom receive humalog pen, but no pen needles came wih insulin. Requesting Pen needles [facility]...Raechel Chute/lmb

## 2016-02-03 ENCOUNTER — Ambulatory Visit (HOSPITAL_COMMUNITY): Payer: Medicare Other | Attending: Cardiology

## 2016-02-03 ENCOUNTER — Other Ambulatory Visit: Payer: Self-pay

## 2016-02-03 DIAGNOSIS — I358 Other nonrheumatic aortic valve disorders: Secondary | ICD-10-CM | POA: Insufficient documentation

## 2016-02-03 DIAGNOSIS — E785 Hyperlipidemia, unspecified: Secondary | ICD-10-CM | POA: Insufficient documentation

## 2016-02-03 DIAGNOSIS — I071 Rheumatic tricuspid insufficiency: Secondary | ICD-10-CM | POA: Diagnosis not present

## 2016-02-03 DIAGNOSIS — I10 Essential (primary) hypertension: Secondary | ICD-10-CM | POA: Diagnosis not present

## 2016-02-03 DIAGNOSIS — R5382 Chronic fatigue, unspecified: Secondary | ICD-10-CM | POA: Diagnosis not present

## 2016-02-03 DIAGNOSIS — E119 Type 2 diabetes mellitus without complications: Secondary | ICD-10-CM | POA: Diagnosis not present

## 2016-02-27 DIAGNOSIS — E119 Type 2 diabetes mellitus without complications: Secondary | ICD-10-CM | POA: Diagnosis not present

## 2016-02-27 DIAGNOSIS — H40033 Anatomical narrow angle, bilateral: Secondary | ICD-10-CM | POA: Diagnosis not present

## 2016-03-06 DIAGNOSIS — E119 Type 2 diabetes mellitus without complications: Secondary | ICD-10-CM | POA: Diagnosis not present

## 2016-03-06 DIAGNOSIS — Z794 Long term (current) use of insulin: Secondary | ICD-10-CM | POA: Diagnosis not present

## 2016-03-15 ENCOUNTER — Telehealth: Payer: Self-pay | Admitting: Internal Medicine

## 2016-03-15 NOTE — Telephone Encounter (Signed)
Seen patient yesterday after hours.  She has hemoglobin/ A1C of 5.6.  Stage 3 PCD.  Patient is having low blood sugars.  States patient should be on insulin only.  Does patient need to stop metformin? To have a less aggressive scale?  Please follow up with Daughter Jerrye BeaversHazel at [phone].  Nurse was on cell phone driving down road.  It was very hard for me to hear her.

## 2016-03-15 NOTE — Telephone Encounter (Signed)
Please advise 

## 2016-03-15 NOTE — Telephone Encounter (Signed)
She is not on metformin as far as I know - it is not in our records that she is taking it - she does not need metformin.  Is she taking it?  She is on insulin only as needed.  What is she currently taking and what sugars is she getting?

## 2016-03-19 NOTE — Telephone Encounter (Signed)
Spoke with pts daughter to clarify.  

## 2016-04-03 ENCOUNTER — Encounter: Payer: Self-pay | Admitting: Podiatry

## 2016-04-03 ENCOUNTER — Ambulatory Visit (INDEPENDENT_AMBULATORY_CARE_PROVIDER_SITE_OTHER): Payer: Medicare Other | Admitting: Podiatry

## 2016-04-03 VITALS — Ht 62.0 in | Wt 106.0 lb

## 2016-04-03 DIAGNOSIS — M79676 Pain in unspecified toe(s): Secondary | ICD-10-CM | POA: Diagnosis not present

## 2016-04-03 DIAGNOSIS — M79606 Pain in leg, unspecified: Secondary | ICD-10-CM

## 2016-04-03 DIAGNOSIS — B351 Tinea unguium: Secondary | ICD-10-CM

## 2016-04-03 NOTE — Progress Notes (Signed)
Subjective:  82 year old female presents requesting toe nails and calluses trimmed.  Had multiple falls since she was in last year and wears wrist brace on right.   Wears AFO on left foot and leg, and ambulating well using a cane.   Objective:  Hypertrophic nails x 10.  Plantar callus on left great toe and 2nd MPJ right.  Left foot pedal pulses are not palpable, right side is normal with palpable pedal pulses.   Assessment:  Dystrophic nails x 10.  Painful feet with corns and toe nails.  Status post stroke with paralysis on left side arm and leg.   Plan:  All nails, corns and calluses debrided.  Return in 3 months or as needed.

## 2016-04-03 NOTE — Patient Instructions (Signed)
Seen for hypertrophic nails. All nails debrided. Return in 3 months or as needed.  

## 2016-04-07 ENCOUNTER — Ambulatory Visit (INDEPENDENT_AMBULATORY_CARE_PROVIDER_SITE_OTHER): Payer: Medicare Other | Admitting: Physician Assistant

## 2016-04-07 ENCOUNTER — Ambulatory Visit (INDEPENDENT_AMBULATORY_CARE_PROVIDER_SITE_OTHER): Payer: Medicare Other

## 2016-04-07 VITALS — BP 132/74 | HR 76 | Temp 97.7°F | Resp 14 | Ht 61.0 in | Wt 106.0 lb

## 2016-04-07 DIAGNOSIS — R0781 Pleurodynia: Secondary | ICD-10-CM

## 2016-04-07 NOTE — Patient Instructions (Addendum)
It is okay to take tylenol 1000 mg every 8 hours as needed for pain.     IF you received an x-ray today, you will receive an invoice from [facility]. Please contact [facility] at [phone] with questions or concerns regarding your invoice.   IF you received labwork today, you will receive an invoice from [facility]. Please contact [facility] at [phone] with questions or concerns regarding your invoice.   Our billing staff will not be able to assist you with questions regarding bills from these companies.  You will be contacted with the lab results as soon as they are available. The fastest way to get your results is to activate your My Chart account. Instructions are located on the last page of this paperwork. If you have not heard from us regarding the results in 2 weeks, please contact this office.

## 2016-04-07 NOTE — Progress Notes (Signed)
04/07/2016 4:42 PM   DOB: [birth year] / MRN: [id number]  SUBJECTIVE:  Patricia BarterCarmen V Andrews is a 82 y.o. female presenting for left sided rib pain that started after a fall which occurred two days ago.  Reports she was going to sit down in her recliner and fell as she was descending towards the chair.  He has a long history of left sided paresis and was unable to stop herself from falling, and subsequently hit her ribs on the chair.  Pt reports she would not be here but her daughter made her come in for an evaluation. She has taken tylenol with some relief of the pain. She denies any other new pain since the fall.  She is ambulating normal for her.  She denies knee knee, hip and back pain.   She has No Known Allergies.   She  has a past medical history of Allergic rhinitis; Anemia; Arthritis; Depression; Diabetes mellitus, type 2 (HCC); Hyperlipidemia; Hypertension; Osteoporosis, senile; Pelvis fracture (HCC); and Stroke (HCC) (1979).    She  reports that she has never smoked. She has never used smokeless tobacco. She reports that she does not drink alcohol or use drugs. She  reports that she does not engage in sexual activity. The patient  has a past surgical history that includes Abdominal hysterectomy (1960) and Colonoscopy (06/06/2012).  Her family history includes Arthritis in her father and mother.  Review of Systems  Constitutional: Negative for fever.  Musculoskeletal: Positive for falls.  Neurological: Negative for sensory change and headaches.    The problem list and medications were reviewed and updated by myself where necessary and exist elsewhere in the encounter.   OBJECTIVE:  BP 132/74   Pulse 76   Temp 97.7 F (36.5 C)   Resp 14   Ht 5\' 1"  (1.549 m)   Wt 106 lb (48.1 kg)   BMI 20.03 kg/m   Physical Exam  Constitutional: She is oriented to person, place, and time. She appears well-developed and well-nourished.  Cardiovascular: Normal rate and regular rhythm.     Pulmonary/Chest: Effort normal and breath sounds normal. No respiratory distress. She has no wheezes. She has no rales. She exhibits no tenderness.    Musculoskeletal: Normal range of motion. She exhibits no edema, tenderness or deformity.  Negative tenderness to palpation about the major joints of the body.  Negative to any deformity.   Neurological: She is alert and oriented to person, place, and time.    No results found for this or any previous visit (from the past 72 hour(s)).  Dg Chest 2 View  Result Date: 04/07/2016 CLINICAL DATA:  82 year old female with left lateral rib pain after fall 2 nights ago. Initial encounter. EXAM: CHEST  2 VIEW COMPARISON:  [facility] portable chest 07/08/2006. Lumbar radiographs 01/17/2014 FINDINGS: Normal lung volumes. Normal cardiac size and mediastinal contours. Visualized tracheal air column is within normal limits. The lungs are clear. No pneumothorax or pleural effusion. Calcified aortic atherosclerosis. Osteopenia. No displaced rib fracture identified. Chronic thoracolumbar scoliosis. Chronic upper lumbar mild superior endplate compression appear stable since 2015. Negative visible bowel gas pattern. IMPRESSION: 1. No displaced rib fracture identified on this routine chest study. 2. No acute cardiopulmonary abnormality. 3.  Calcified aortic atherosclerosis. 4. Stable appearance of mild upper lumbar vertebral compression since 2015. Electronically Signed   By: Odessa FlemingH  Hall M.D.   On: 04/07/2016 16:39   Lab Results  Component Value Date   ALT 7 01/16/2016   AST 13  01/16/2016   ALKPHOS 49 01/16/2016   BILITOT 0.3 01/16/2016      ASSESSMENT AND PLAN  [name] was seen today for fall and chest pain.  Diagnoses and all orders for this visit:  Rib pain on left side: Rads and exam reassuring.  Advised Tylenol 1000 mg q8 prn for pain.  Advised she avoid NSAIDs unless otherwise prescribed.  -     DG Chest 2 View; Future    The patient is  advised to call or return to clinic if she does not see an improvement in symptoms, or to seek the care of the closest emergency department if she worsens with the above plan.   Deliah Boston, MHS, PA-C [facility] 04/07/2016 4:42 PM

## 2016-05-08 DIAGNOSIS — E119 Type 2 diabetes mellitus without complications: Secondary | ICD-10-CM | POA: Diagnosis not present

## 2016-05-08 DIAGNOSIS — Z794 Long term (current) use of insulin: Secondary | ICD-10-CM | POA: Diagnosis not present

## 2016-05-10 DIAGNOSIS — Z794 Long term (current) use of insulin: Secondary | ICD-10-CM | POA: Diagnosis not present

## 2016-05-10 DIAGNOSIS — E119 Type 2 diabetes mellitus without complications: Secondary | ICD-10-CM | POA: Diagnosis not present

## 2016-05-25 ENCOUNTER — Telehealth: Payer: Self-pay | Admitting: Internal Medicine

## 2016-05-25 NOTE — Telephone Encounter (Signed)
Pt's daughter called request rx send to [facility] for modify shoes to attach to brace. Please help, att [place] Fax # [phone].

## 2016-05-26 NOTE — Telephone Encounter (Signed)
Please advise 

## 2016-05-28 NOTE — Telephone Encounter (Signed)
Prescription written

## 2016-05-29 NOTE — Telephone Encounter (Signed)
[facility]

## 2016-06-12 ENCOUNTER — Other Ambulatory Visit: Payer: Self-pay | Admitting: Internal Medicine

## 2016-06-25 ENCOUNTER — Telehealth: Payer: Self-pay

## 2016-06-25 NOTE — Telephone Encounter (Signed)
Patients next prolia injection due either on/after 07/19/16---I am verifiying insurance benefits and will be calling patient with results---can talk with Temara Lanum if any questions

## 2016-07-04 ENCOUNTER — Encounter: Payer: Self-pay | Admitting: Podiatry

## 2016-07-04 ENCOUNTER — Telehealth: Payer: Self-pay

## 2016-07-04 ENCOUNTER — Ambulatory Visit (INDEPENDENT_AMBULATORY_CARE_PROVIDER_SITE_OTHER): Payer: Medicare Other | Admitting: Podiatry

## 2016-07-04 VITALS — Ht 62.0 in | Wt 114.0 lb

## 2016-07-04 DIAGNOSIS — B351 Tinea unguium: Secondary | ICD-10-CM | POA: Diagnosis not present

## 2016-07-04 DIAGNOSIS — M79671 Pain in right foot: Secondary | ICD-10-CM | POA: Diagnosis not present

## 2016-07-04 DIAGNOSIS — M79672 Pain in left foot: Secondary | ICD-10-CM

## 2016-07-04 NOTE — Telephone Encounter (Signed)
Left message advising patient that prolia injection insurance has been verified and estimated $250 copay for injection that can be given either on or after 11/23---can talk with Donell Sliwinski if any questions

## 2016-07-04 NOTE — Patient Instructions (Signed)
Seen for hypertrophic nails. All nails debrided. No new problems noted. Return in 3 months or as needed.  

## 2016-07-04 NOTE — Progress Notes (Signed)
Subjective:  82 year old female presents requesting toe nails and calluses trimmed.   History of multiple falls since she was in last year and wears wrist brace on right.  Wears AFO on left foot and leg, and ambulating well using a cane.  Now she is living with her daughter.  Objective: Hypertrophic nails x 10.  Hyperpigmented right bunion without erythema or edema. Plantar callus on left great toe and 2nd MPJ right.  Left foot pedal pulses are not palpable, right side is normal with palpable pedal pulses.   Assessment: Dystrophic nails x 10.  Painful feet with corns, calluses, and toe nails.  Status post stroke with paralysis on left side arm and leg.   Plan: All nails, corns and calluses debrided.  Return in 3 months or as needed.

## 2016-07-08 ENCOUNTER — Emergency Department (HOSPITAL_COMMUNITY): Payer: Medicare Other

## 2016-07-08 ENCOUNTER — Encounter (HOSPITAL_COMMUNITY): Payer: Self-pay | Admitting: Emergency Medicine

## 2016-07-08 ENCOUNTER — Emergency Department (HOSPITAL_COMMUNITY)
Admission: EM | Admit: 2016-07-08 | Discharge: 2016-07-09 | Disposition: A | Discharge status: Home / Self Care | Payer: Medicare Other | Attending: Emergency Medicine | Admitting: Emergency Medicine

## 2016-07-08 DIAGNOSIS — Z7984 Long term (current) use of oral hypoglycemic drugs: Secondary | ICD-10-CM | POA: Insufficient documentation

## 2016-07-08 DIAGNOSIS — S0101XA Laceration without foreign body of scalp, initial encounter: Secondary | ICD-10-CM

## 2016-07-08 DIAGNOSIS — Z794 Long term (current) use of insulin: Secondary | ICD-10-CM | POA: Diagnosis not present

## 2016-07-08 DIAGNOSIS — Z79899 Other long term (current) drug therapy: Secondary | ICD-10-CM | POA: Diagnosis not present

## 2016-07-08 DIAGNOSIS — Y929 Unspecified place or not applicable: Secondary | ICD-10-CM | POA: Insufficient documentation

## 2016-07-08 DIAGNOSIS — D649 Anemia, unspecified: Secondary | ICD-10-CM

## 2016-07-08 DIAGNOSIS — W0110XA Fall on same level from slipping, tripping and stumbling with subsequent striking against unspecified object, initial encounter: Secondary | ICD-10-CM | POA: Diagnosis not present

## 2016-07-08 DIAGNOSIS — Z8673 Personal history of transient ischemic attack (TIA), and cerebral infarction without residual deficits: Secondary | ICD-10-CM | POA: Insufficient documentation

## 2016-07-08 DIAGNOSIS — W19XXXA Unspecified fall, initial encounter: Secondary | ICD-10-CM

## 2016-07-08 DIAGNOSIS — Y999 Unspecified external cause status: Secondary | ICD-10-CM | POA: Insufficient documentation

## 2016-07-08 DIAGNOSIS — I10 Essential (primary) hypertension: Secondary | ICD-10-CM | POA: Insufficient documentation

## 2016-07-08 DIAGNOSIS — Y9389 Activity, other specified: Secondary | ICD-10-CM | POA: Insufficient documentation

## 2016-07-08 DIAGNOSIS — E119 Type 2 diabetes mellitus without complications: Secondary | ICD-10-CM | POA: Diagnosis not present

## 2016-07-08 DIAGNOSIS — S0990XA Unspecified injury of head, initial encounter: Secondary | ICD-10-CM | POA: Diagnosis present

## 2016-07-08 LAB — BASIC METABOLIC PANEL
ANION GAP: 7 (ref 5–15)
BUN: 28 mg/dL — AB (ref 6–20)
CALCIUM: 8 mg/dL — AB (ref 8.9–10.3)
CO2: 18 mmol/L — AB (ref 22–32)
Chloride: 111 mmol/L (ref 101–111)
Creatinine, Ser: 1.34 mg/dL — ABNORMAL HIGH (ref 0.44–1.00)
GFR calc Af Amer: 40 mL/min — ABNORMAL LOW (ref >60)
GFR calc non Af Amer: 34 mL/min — ABNORMAL LOW (ref >60)
GLUCOSE: 327 mg/dL — AB (ref 65–99)
POTASSIUM: 4.6 mmol/L (ref 3.5–5.1)
Sodium: 136 mmol/L (ref 135–145)

## 2016-07-08 LAB — CBC
HEMATOCRIT: 25.9 % — AB (ref 36.0–46.0)
Hemoglobin: 8.2 g/dL — ABNORMAL LOW (ref 12.0–15.0)
MCH: 30.4 pg (ref 26.0–34.0)
MCHC: 31.7 g/dL (ref 30.0–36.0)
MCV: 95.9 fL (ref 78.0–100.0)
Platelets: 140 10*3/uL — ABNORMAL LOW (ref 150–400)
RBC: 2.7 MIL/uL — AB (ref 3.87–5.11)
RDW: 12.9 % (ref 11.5–15.5)
WBC: 6.5 10*3/uL (ref 4.0–10.5)

## 2016-07-08 LAB — I-STAT TROPONIN, ED: Troponin i, poc: 0 ng/mL (ref 0.00–0.08)

## 2016-07-08 MED ORDER — ONDANSETRON HCL 4 MG/2ML IJ SOLN
INTRAMUSCULAR | Status: AC
  Filled 2016-07-08: qty 2

## 2016-07-08 MED ORDER — SODIUM CHLORIDE 0.9 % IV BOLUS (SEPSIS)
1000.0000 mL | Freq: Once | INTRAVENOUS | Status: AC
  Administered 2016-07-08: 1000 mL via INTRAVENOUS

## 2016-07-08 MED ORDER — ONDANSETRON HCL 4 MG/2ML IJ SOLN: 4.0000 mg | Freq: Once | INTRAMUSCULAR | Status: DC

## 2016-07-08 MED ORDER — ONDANSETRON HCL 4 MG/2ML IJ SOLN
4.0000 mg | Freq: Once | INTRAMUSCULAR | Status: AC
  Administered 2016-07-08: 4 mg via INTRAVENOUS

## 2016-07-08 NOTE — ED Notes (Signed)
Staples applied to bleeding on back of head. C spined maintained during this time. C collar in place. Liter bolus started.

## 2016-07-08 NOTE — ED Notes (Signed)
Pt to CT

## 2016-07-08 NOTE — Discharge Instructions (Signed)
Mrs. Patricia Andrews is anemic. Hemoglobin today was 8.2. There may be a small amount of bleeding from the wound. It should clot off within the next 12 hours. Staples out one week from Tuesday. Return for any concerns

## 2016-07-08 NOTE — ED Triage Notes (Signed)
Per [facility], pt lives at home with family, had previous stroke in 1979, pt paralyzed on L side completely, fell due to weakness on L side per baseline. Pt fell and hit the back of her head on a coffee table, denies LOC, pt has large pool of blood in the back of head. Pt arterial bleeding. Pt also hypotensive and c/o nausea. Pt is AAOX4.

## 2016-07-09 ENCOUNTER — Telehealth: Payer: Self-pay | Admitting: Internal Medicine

## 2016-07-09 MED ORDER — ONDANSETRON 4 MG PO TBDP
8.0000 mg | ORAL_TABLET | Freq: Once | ORAL | Status: AC
  Administered 2016-07-09: 8 mg via ORAL
  Filled 2016-07-09: qty 2

## 2016-07-09 NOTE — ED Notes (Signed)
Pt begin getting nausea after she was moved to wheel chair; pt given med; Pt wheeled to exit by EMT

## 2016-07-09 NOTE — Telephone Encounter (Signed)
Error. Made and appointment.

## 2016-07-11 ENCOUNTER — Ambulatory Visit (INDEPENDENT_AMBULATORY_CARE_PROVIDER_SITE_OTHER): Payer: Medicare Other | Admitting: Internal Medicine

## 2016-07-11 ENCOUNTER — Encounter: Payer: Self-pay | Admitting: Internal Medicine

## 2016-07-11 VITALS — BP 136/72 | HR 76 | Temp 98.3°F | Resp 16 | Wt 116.0 lb

## 2016-07-11 DIAGNOSIS — D649 Anemia, unspecified: Secondary | ICD-10-CM

## 2016-07-11 DIAGNOSIS — S0101XD Laceration without foreign body of scalp, subsequent encounter: Secondary | ICD-10-CM | POA: Diagnosis not present

## 2016-07-11 DIAGNOSIS — Z23 Encounter for immunization: Secondary | ICD-10-CM

## 2016-07-11 DIAGNOSIS — I10 Essential (primary) hypertension: Secondary | ICD-10-CM

## 2016-07-11 DIAGNOSIS — S0191XA Laceration without foreign body of unspecified part of head, initial encounter: Secondary | ICD-10-CM | POA: Insufficient documentation

## 2016-07-11 DIAGNOSIS — M549 Dorsalgia, unspecified: Secondary | ICD-10-CM | POA: Insufficient documentation

## 2016-07-11 DIAGNOSIS — R2689 Other abnormalities of gait and mobility: Secondary | ICD-10-CM

## 2016-07-11 MED ORDER — FERROUS SULFATE DRIED ER 160 (50 FE) MG PO TBCR: 1.0000 | EXTENDED_RELEASE_TABLET | Freq: Every day | ORAL | 5 refills | Status: DC

## 2016-07-11 NOTE — Assessment & Plan Note (Signed)
BP well controlled Current regimen effective and well tolerated Continue current medications at current doses  

## 2016-07-11 NOTE — Assessment & Plan Note (Signed)
Chronic poor balance, worse recently Just fell and has head laceration Would benefit from balance exercises and strengthening with PT - does not drive - will refer for home PT

## 2016-07-11 NOTE — Assessment & Plan Note (Signed)
Iron level normal last may - ? Some degree of iron def Has CKD which is likely contributing Will check FIT test Trial of slow FE Will avoid full GI work up given prior negative w/u 2013 and age Consider heme referral Recheck labs in few weeks

## 2016-07-11 NOTE — Progress Notes (Signed)
Patient received education resource, including the self-management goal and tool. Patient verbalized understanding. 

## 2016-07-11 NOTE — Assessment & Plan Note (Signed)
4 staples in place - healing well w/o signs of infection Staples to be removed on Tuesday No pain or headaches or other concerning symptoms

## 2016-07-11 NOTE — Assessment & Plan Note (Signed)
Since fall  Very mild Improving with tylenol and heating pad Will being doing PT Continue above

## 2016-07-11 NOTE — Patient Instructions (Addendum)
Have the staples removed next Tuesday.  Flu immunization administered today.   Medications reviewed and updated.  Changes include starting Slow Fe (iron) - take one tablet daily.  Your prescription(s) have been submitted to your pharmacy. Please take as directed and contact our office if you believe you are having problem(s) with the medication(s).  A referral was ordered for home PT.   Follow up on 11/28.

## 2016-07-11 NOTE — Progress Notes (Signed)
Subjective:    Patient ID: Patricia Andrews, female    DOB: [birth year], 82 y.o.   MRN: [id number]  HPI She is here for hospital follow up.   She went to the ED on 11/12 after a fall.  She has a history of a stroke in 1979 and has paralysis of the left side.  She has poor balance, which is not new.  She became unbalanced and fell.  She hit the back of her head on a coffee table. There was no LOC.  There was a large amount of blood.  She was hypotensive and nauseous.  She was A&O x 3 in the ED.  She had 4 staples placed in the back of her head. The staples are to be removed on 11/21.  She had a Ct of her head and C-spine in the ED and there were no acute spine injuries or intracranial bleeding.   She is taking tylenol three times a day and she denies pain in her head/headache.  Her upper back is hurting.  She is using a heating pad and taking the tylenol and it helps.  She denies lightheadedness and dizziness.    Her anemia was slightly worse in the ED.  She has chronic anemia.  She has had a full GI work up in 2013, which was negative.  She has not tolerated regular iron due to constipation.  She also has CKD, which is likely contributing.    Her daughter has noticed some short term memory difficulties that is worse in the evening.   Medications and allergies reviewed with patient and updated if appropriate.  Patient Active Problem List   Diagnosis Date Noted  . Chronic fatigue 01/16/2016  . Distal radius fracture   . Right wrist fracture 05/14/2015  . Onychomycosis 02/04/2013  . Pain due to onychomycosis of toenail 02/04/2013  . Diabetes type 2, controlled (HCC) 09/20/2011  . Hypertension 09/20/2011  . Hyperlipidemia 09/20/2011  . Senile osteoporosis 09/20/2011  . Depression with anxiety 09/20/2011  . Anemia 09/20/2011  . CVA, old, hemiparesis (HCC)     Current Outpatient Prescriptions on File Prior to Visit  Medication Sig Dispense Refill  . baclofen (LIORESAL) 10 MG tablet  Take 1 tablet (10 mg total) by mouth 3 (three) times daily. 30 each 0  . bisacodyl (DULCOLAX) 5 MG EC tablet Take 5 mg by mouth daily as needed. For constipation.    . cholecalciferol (VITAMIN D) 1000 UNITS tablet Take 1,000 Units by mouth daily.    . citalopram (CELEXA) 20 MG tablet Take 1 tablet (20 mg total) by mouth daily. 90 tablet 3  . clopidogrel (PLAVIX) 75 MG tablet Take 1 tablet by mouth  daily with breakfast 90 tablet 1  . denosumab (PROLIA) 60 MG/ML SOLN injection Inject 60 mg into the skin every 6 (six) months. Administer in upper arm, thigh, or abdomen    . diltiazem (CARDIZEM CD) 120 MG 24 hr capsule Take 1 capsule (120 mg total) by mouth daily. 90 capsule 2  . glimepiride (AMARYL) 2 MG tablet Take 1 tablet by mouth  daily with breakfast 90 tablet 1  . hydroxypropyl methylcellulose / hypromellose (ISOPTO TEARS / GONIOVISC) 2.5 % ophthalmic solution Place 1 drop into both eyes as needed for dry eyes (dry eyes).    . insulin lispro (HUMALOG KWIKPEN) 100 UNIT/ML KiwkPen Inject into the skin 3 (three) times daily. (Per sliding Scale) 151-200 = 2 units, 201-250 = 4 units, 251-300 = 6 units  301-350 = 8 units, 351-400 = 10 units    . Insulin Pen Needle 32G X 4 MM MISC Use to administer insulin four times a day Dx E11.9 30 each 0  . mirtazapine (REMERON) 15 MG tablet TAKE 1 TABLET BY MOUTH AT  BEDTIME 90 tablet 1  . olmesartan (BENICAR) 20 MG tablet Take 1 tablet by mouth  daily 90 tablet 1  . Omega-3 Fatty Acids (FISH OIL PO) Take 1 capsule by mouth every morning.    . simvastatin (ZOCOR) 20 MG tablet Take 1 tablet by mouth at  bedtime 90 tablet 1  . traMADol (ULTRAM) 50 MG tablet Take 1 tablet (50 mg total) by mouth every 6 (six) hours as needed. 15 tablet 0  . triamcinolone (KENALOG) 0.025 % ointment Apply 1 application topically 2 (two) times daily. 30 g 5  . vitamin B-12 (CYANOCOBALAMIN) 100 MCG tablet Take 100 mcg by mouth daily.    Marland Kitchen. zolpidem (AMBIEN) 5 MG tablet Take 1 tablet (5  mg total) by mouth at bedtime as needed. For sleep. 30 tablet 3   No current facility-administered medications on file prior to visit.     Past Medical History:  Diagnosis Date  . Allergic rhinitis   . Anemia   . Arthritis   . Depression    anxiety overlap  . Diabetes mellitus, type 2 (HCC)   . Hyperlipidemia   . Hypertension   . Osteoporosis, senile    accidental fall w/ pelvic fx 2006, L wrist fx 10/2013  . Pelvis fracture (HCC)   . Stroke [facility](HCC) 1979   R MCA>>residual spastic L HP; TIA 06/2011    Past Surgical History:  Procedure Laterality Date  . ABDOMINAL HYSTERECTOMY  1960   Fibroid tumors  . COLONOSCOPY  06/06/2012   Procedure: COLONOSCOPY;  Surgeon: Louis Meckelobert D Kaplan, MD;  Location: [facility];  Service: Endoscopy;  Laterality: N/A;  Rm 1501. Pt to be discharged after procedure. Patient was only admitted for prep.    Social History   Social History  . Marital status: Single    Spouse name: N/A  . Number of children: 2  . Years of education: N/A   Occupational History  . RETIRED Retired   Social History Main Topics  . Smoking status: Never Smoker  . Smokeless tobacco: Never Used  . Alcohol use No  . Drug use: No  . Sexual activity: No   Other Topics Concern  . Not on file   Social History Narrative   Lives alone in independent living, single. Never married   Supportive daughter Jerrye BeaversHazel -    Family History  Problem Relation Age of Onset  . Arthritis Mother   . Arthritis Father     Review of Systems  Constitutional: Negative for fever.  Eyes: Negative for visual disturbance.  Cardiovascular: Positive for leg swelling (left - chronic). Negative for chest pain and palpitations.  Gastrointestinal: Negative for abdominal pain, blood in stool (no black stool ), constipation, diarrhea and nausea.       No gerd  Musculoskeletal: Positive for gait problem (worse than prior to fall).       Mild upper back stiffness/tenderness since fall esp if she sleeps  the wrong way  Neurological: Negative for headaches.       Objective:   Vitals:   07/11/16 1329  BP: 136/72  Pulse: 76  Resp: 16  Temp: 98.3 F (36.8 C)   Filed Weights   07/11/16 1329  Weight: 116 lb (  52.6 kg)   Body mass index is 21.22 kg/m.   Physical Exam  Constitutional: She appears well-developed and well-nourished. No distress.  HENT:  Head: Normocephalic.  Left posterior side of head with 4 staples and mild swelling, no tenderness in area, no ecchymosis, laceration healing well w/o discharge or signs of infection  Eyes: Conjunctivae are normal.  Neck: Neck supple. No tracheal deviation present. No thyromegaly present.  Cardiovascular: Normal rate and regular rhythm.   Murmur (2/6 systolic) heard. Pulmonary/Chest: Breath sounds normal. No respiratory distress. She has no wheezes. She has no rales.  Abdominal: She exhibits no distension. There is no tenderness.  Musculoskeletal: She exhibits no edema.  Mild upper back tenderness  Lymphadenopathy:    She has no cervical adenopathy.  Skin: Skin is warm and dry. She is not diaphoretic. No erythema.  Psychiatric: She has a normal mood and affect. Her behavior is normal.        Assessment & Plan:   See Problem List for Assessment and Plan of chronic medical problems.

## 2016-07-12 ENCOUNTER — Other Ambulatory Visit: Payer: Self-pay | Admitting: Internal Medicine

## 2016-07-13 NOTE — ED Provider Notes (Signed)
[facility]-EMERGENCY DEPT Provider Note   CSN: [id number] Arrival date & time: 07/08/16  2149     History   Chief Complaint Chief Complaint  Patient presents with  . Fall  . Head Laceration    HPI Patricia Andrews is a 82 y.o. female.  Level V caveat for urgent need for intervention. Patient was trying to move out of her wheelchair when she accidentally slipped and fell striking the back of her head. No loss of consciousness or neuro deficits. She complains of a bleeding laceration to the occipital area. No chest pain or dyspnea.      Past Medical History:  Diagnosis Date  . Allergic rhinitis   . Anemia   . Arthritis   . Depression    anxiety overlap  . Diabetes mellitus, type 2 (HCC)   . Hyperlipidemia   . Hypertension   . Osteoporosis, senile    accidental fall w/ pelvic fx 2006, L wrist fx 10/2013  . Pelvis fracture (HCC)   . Stroke [facility]) 1979   R MCA>>residual spastic L HP; TIA 06/2011    Patient Active Problem List   Diagnosis Date Noted  . Laceration of head 07/11/2016  . Poor balance 07/11/2016  . Upper back pain 07/11/2016  . Chronic fatigue 01/16/2016  . Distal radius fracture   . Right wrist fracture 05/14/2015  . Onychomycosis 02/04/2013  . Pain due to onychomycosis of toenail 02/04/2013  . Diabetes type 2, controlled (HCC) 09/20/2011  . Hypertension 09/20/2011  . Hyperlipidemia 09/20/2011  . Senile osteoporosis 09/20/2011  . Depression with anxiety 09/20/2011  . Anemia 09/20/2011  . CVA, old, hemiparesis (HCC)     Past Surgical History:  Procedure Laterality Date  . ABDOMINAL HYSTERECTOMY  1960   Fibroid tumors  . COLONOSCOPY  06/06/2012   Procedure: COLONOSCOPY;  Surgeon: Louis Meckel, MD;  Location: [facility];  Service: Endoscopy;  Laterality: N/A;  Rm 1501. Pt to be discharged after procedure. Patient was only admitted for prep.    OB History    No data available       Home Medications    Prior to Admission medications     Medication Sig Start Date End Date Taking? Authorizing Provider  cholecalciferol (VITAMIN D) 1000 UNITS tablet Take 1,000 Units by mouth daily.   Yes Historical Provider, MD  citalopram (CELEXA) 20 MG tablet Take 1 tablet (20 mg total) by mouth daily. 01/16/16  Yes Pincus Sanes, MD  denosumab (PROLIA) 60 MG/ML SOLN injection Inject 60 mg into the skin every 6 (six) months. Administer in upper arm, thigh, or abdomen   Yes Historical Provider, MD  hydroxypropyl methylcellulose / hypromellose (ISOPTO TEARS / GONIOVISC) 2.5 % ophthalmic solution Place 1 drop into both eyes as needed for dry eyes (dry eyes).   Yes Historical Provider, MD  insulin lispro (HUMALOG KWIKPEN) 100 UNIT/ML KiwkPen Inject into the skin 3 (three) times daily. (Per sliding Scale) 151-200 = 2 units, 201-250 = 4 units, 251-300 = 6 units 301-350 = 8 units, 351-400 = 10 units   Yes Historical Provider, MD  Insulin Pen Needle 32G X 4 MM MISC Use to administer insulin four times a day Dx E11.9 02/01/16  Yes Pincus Sanes, MD  triamcinolone (KENALOG) 0.025 % ointment Apply 1 application topically 2 (two) times daily. 01/16/16  Yes Pincus Sanes, MD  vitamin B-12 (CYANOCOBALAMIN) 100 MCG tablet Take 100 mcg by mouth daily.   Yes Historical Provider, MD  zolpidem (AMBIEN) 5  MG tablet Take 1 tablet (5 mg total) by mouth at bedtime as needed. For sleep. 04/13/15  Yes Newt LukesValerie A Leschber, MD  baclofen (LIORESAL) 10 MG tablet Take 1 tablet (10 mg total) by mouth 3 (three) times daily. 08/23/15   Pincus SanesStacy J Burns, MD  CARTIA XT 120 MG 24 hr capsule TAKE 1 CAPSULE BY MOUTH  DAILY 07/13/16   Pincus SanesStacy J Burns, MD  clopidogrel (PLAVIX) 75 MG tablet TAKE 1 TABLET BY MOUTH  DAILY WITH BREAKFAST 07/13/16   Pincus SanesStacy J Burns, MD  ferrous sulfate (SLOW FE) 160 (50 Fe) MG TBCR SR tablet Take 1 tablet (160 mg total) by mouth daily. 07/11/16   Pincus SanesStacy J Burns, MD  glimepiride (AMARYL) 2 MG tablet Take 1 tablet by mouth  daily with breakfast 12/14/15   Pincus SanesStacy J Burns, MD   mirtazapine (REMERON) 15 MG tablet TAKE 1 TABLET BY MOUTH AT  BEDTIME 06/12/16   Pincus SanesStacy J Burns, MD  olmesartan (BENICAR) 20 MG tablet TAKE 1 TABLET BY MOUTH  DAILY 07/13/16   Pincus SanesStacy J Burns, MD  Omega-3 Fatty Acids (FISH OIL PO) Take 1 capsule by mouth every morning.    Historical Provider, MD  simvastatin (ZOCOR) 20 MG tablet TAKE 1 TABLET BY MOUTH AT  BEDTIME 07/13/16   Pincus SanesStacy J Burns, MD  traMADol (ULTRAM) 50 MG tablet Take 1 tablet (50 mg total) by mouth every 6 (six) hours as needed. 05/16/15   Alba CoryBelkys A Regalado, MD    Family History Family History  Problem Relation Age of Onset  . Arthritis Mother   . Arthritis Father     Social History Social History  Substance Use Topics  . Smoking status: Never Smoker  . Smokeless tobacco: Never Used  . Alcohol use No     Allergies   Patient has no known allergies.   Review of Systems Review of Systems  Reason unable to perform ROS: Urgent need for intervention.     Physical Exam Updated Vital Signs BP 153/59   Pulse 70   Temp 97.7 F (36.5 C) (Oral)   Resp 14   SpO2 100%   Physical Exam  Constitutional: She is oriented to person, place, and time. She appears well-developed and well-nourished.  HENT:  Head: Normocephalic.  2.5 cm laceration on the occipital area  Eyes: Conjunctivae are normal.  Neck: Neck supple.  Cardiovascular: Normal rate and regular rhythm.   Pulmonary/Chest: Effort normal and breath sounds normal.  Abdominal: Soft. Bowel sounds are normal.  Musculoskeletal: Normal range of motion.  Neurological: She is alert and oriented to person, place, and time.  Left-sided weakness (old).  Skin: Skin is warm and dry.  Psychiatric: She has a normal mood and affect. Her behavior is normal.  Nursing note and vitals reviewed.    ED Treatments / Results  Labs (all labs ordered are listed, but only abnormal results are displayed) Labs Reviewed  BASIC METABOLIC PANEL - Abnormal; Notable for the following:        Result Value   CO2 18 (*)    Glucose, Bld 327 (*)    BUN 28 (*)    Creatinine, Ser 1.34 (*)    Calcium 8.0 (*)    GFR calc non Af Amer 34 (*)    GFR calc Af Amer 40 (*)    All other components within normal limits  CBC - Abnormal; Notable for the following:    RBC 2.70 (*)    Hemoglobin 8.2 (*)    HCT  25.9 (*)    Platelets 140 (*)    All other components within normal limits  I-STAT TROPOININ, ED    EKG  EKG Interpretation  Date/Time:  Sunday July 08 2016 22:06:03 EST Ventricular Rate:  65 PR Interval:    QRS Duration: 84 QT Interval:  443 QTC Calculation: 461 R Axis:   8 Text Interpretation:  Sinus rhythm Ventricular trigeminy Borderline T abnormalities, anterior leads Confirmed by Adriana SimasOOK  MD, Ahmari Duerson ([id number]) on 07/08/2016 10:09:16 PM Also confirmed by Adriana SimasOOK  MD, Rajiv Parlato ([id number]), editor [place]  CCT, BEVERLY ([id number])  on 07/09/2016 7:02:14 AM       Radiology No results found.  Procedures .Marland Kitchen.Laceration Repair Date/Time: 07/08/2016 10:00 PM Performed by: Donnetta HutchingOOK, [place] Lusty Authorized by: Donnetta HutchingOOK, Luisa Louk   Laceration details:    Wound length (cm): 2.5 cm. Comments:     Obvious bleeding from occipital area. Hair trimmed from laceration site. Wound cleaned and irrigated. Staples 5. Turban dressing. Patient tolerated procedure well.   (including critical care time)  Medications Ordered in ED Medications  sodium chloride 0.9 % bolus 1,000 mL (0 mLs Intravenous Stopped 07/09/16 0022)  ondansetron (ZOFRAN) injection 4 mg (4 mg Intravenous Given 07/08/16 2158)  ondansetron (ZOFRAN-ODT) disintegrating tablet 8 mg (8 mg Oral Given 07/09/16 0043)     Initial Impression / Assessment and Plan / ED Course  I have reviewed the triage vital signs and the nursing notes.  Pertinent labs & imaging results that were available during my care of the patient were reviewed by me and considered in my medical decision making (see chart for details).  Clinical Course     Patient's  behavior is at baseline per family. She is anemic, but this is not new.  Laceration repair with surgical staples. Patient was stable at discharge.  Final Clinical Impressions(s) / ED Diagnoses   Final diagnoses:  Fall, initial encounter  Laceration of scalp, initial encounter  Anemia, unspecified type    New Prescriptions Discharge Medication List as of 07/08/2016 11:56 PM       Donnetta HutchingBrian Chakita Mcgraw, MD 07/13/16 [phone]

## 2016-07-17 ENCOUNTER — Other Ambulatory Visit (INDEPENDENT_AMBULATORY_CARE_PROVIDER_SITE_OTHER): Payer: Medicare Other

## 2016-07-17 ENCOUNTER — Encounter: Payer: Self-pay | Admitting: Family

## 2016-07-17 ENCOUNTER — Emergency Department (HOSPITAL_COMMUNITY)
Admission: EM | Admit: 2016-07-17 | Discharge: 2016-07-18 | Disposition: A | Discharge status: Home / Self Care | Payer: Medicare Other | Attending: Emergency Medicine | Admitting: Emergency Medicine

## 2016-07-17 ENCOUNTER — Ambulatory Visit (INDEPENDENT_AMBULATORY_CARE_PROVIDER_SITE_OTHER): Payer: Medicare Other | Admitting: Family

## 2016-07-17 ENCOUNTER — Encounter (HOSPITAL_COMMUNITY): Payer: Self-pay | Admitting: Emergency Medicine

## 2016-07-17 ENCOUNTER — Telehealth: Payer: Self-pay

## 2016-07-17 VITALS — BP 144/76 | HR 85 | Resp 16 | Ht 62.0 in | Wt 116.0 lb

## 2016-07-17 DIAGNOSIS — W19XXXA Unspecified fall, initial encounter: Secondary | ICD-10-CM | POA: Insufficient documentation

## 2016-07-17 DIAGNOSIS — D649 Anemia, unspecified: Secondary | ICD-10-CM | POA: Diagnosis not present

## 2016-07-17 DIAGNOSIS — Z79899 Other long term (current) drug therapy: Secondary | ICD-10-CM | POA: Insufficient documentation

## 2016-07-17 DIAGNOSIS — Z8673 Personal history of transient ischemic attack (TIA), and cerebral infarction without residual deficits: Secondary | ICD-10-CM | POA: Diagnosis not present

## 2016-07-17 DIAGNOSIS — W19XXXD Unspecified fall, subsequent encounter: Secondary | ICD-10-CM | POA: Diagnosis not present

## 2016-07-17 DIAGNOSIS — Z794 Long term (current) use of insulin: Secondary | ICD-10-CM | POA: Diagnosis not present

## 2016-07-17 DIAGNOSIS — Z5181 Encounter for therapeutic drug level monitoring: Secondary | ICD-10-CM | POA: Insufficient documentation

## 2016-07-17 DIAGNOSIS — I10 Essential (primary) hypertension: Secondary | ICD-10-CM | POA: Insufficient documentation

## 2016-07-17 DIAGNOSIS — E875 Hyperkalemia: Secondary | ICD-10-CM | POA: Diagnosis not present

## 2016-07-17 DIAGNOSIS — S0101XD Laceration without foreign body of scalp, subsequent encounter: Secondary | ICD-10-CM

## 2016-07-17 DIAGNOSIS — E119 Type 2 diabetes mellitus without complications: Secondary | ICD-10-CM | POA: Diagnosis not present

## 2016-07-17 DIAGNOSIS — R799 Abnormal finding of blood chemistry, unspecified: Secondary | ICD-10-CM | POA: Diagnosis present

## 2016-07-17 LAB — POTASSIUM: Potassium: 5.7 mmol/L — ABNORMAL HIGH (ref 3.5–5.1)

## 2016-07-17 LAB — CBC WITH DIFFERENTIAL/PLATELET
BASOS PCT: 0 %
Basophils Absolute: 0 10*3/uL (ref 0.0–0.1)
EOS ABS: 0.1 10*3/uL (ref 0.0–0.7)
Eosinophils Relative: 1 %
HEMATOCRIT: 23.4 % — AB (ref 36.0–46.0)
HEMOGLOBIN: 7.4 g/dL — AB (ref 12.0–15.0)
Lymphocytes Relative: 37 %
Lymphs Abs: 1.9 10*3/uL (ref 0.7–4.0)
MCH: 30.5 pg (ref 26.0–34.0)
MCHC: 31.6 g/dL (ref 30.0–36.0)
MCV: 96.3 fL (ref 78.0–100.0)
Monocytes Absolute: 0.4 10*3/uL (ref 0.1–1.0)
Monocytes Relative: 8 %
NEUTROS ABS: 2.8 10*3/uL (ref 1.7–7.7)
NEUTROS PCT: 54 %
Platelets: 215 10*3/uL (ref 150–400)
RBC: 2.43 MIL/uL — AB (ref 3.87–5.11)
RDW: 14.2 % (ref 11.5–15.5)
WBC: 5.2 10*3/uL (ref 4.0–10.5)

## 2016-07-17 LAB — COMPREHENSIVE METABOLIC PANEL
ALBUMIN: 3.9 g/dL (ref 3.5–5.0)
ALK PHOS: 50 U/L (ref 38–126)
ALT: 12 U/L — AB (ref 14–54)
AST: 20 U/L (ref 15–41)
Anion gap: 7 (ref 5–15)
BUN: 33 mg/dL — AB (ref 6–20)
CALCIUM: 9.1 mg/dL (ref 8.9–10.3)
CO2: 22 mmol/L (ref 22–32)
CREATININE: 1.48 mg/dL — AB (ref 0.44–1.00)
Chloride: 105 mmol/L (ref 101–111)
GFR calc Af Amer: 35 mL/min — ABNORMAL LOW (ref >60)
GFR calc non Af Amer: 30 mL/min — ABNORMAL LOW (ref >60)
GLUCOSE: 221 mg/dL — AB (ref 65–99)
Potassium: 6.3 mmol/L (ref 3.5–5.1)
SODIUM: 134 mmol/L — AB (ref 135–145)
Total Bilirubin: 0.4 mg/dL (ref 0.3–1.2)
Total Protein: 6.5 g/dL (ref 6.5–8.1)

## 2016-07-17 LAB — FERRITIN: FERRITIN: 34 ng/mL (ref 11–307)

## 2016-07-17 LAB — FOLATE: FOLATE: 26.1 ng/mL (ref >5.9)

## 2016-07-17 LAB — APTT: aPTT: 26 seconds (ref 24–36)

## 2016-07-17 LAB — IRON AND TIBC
IRON: 36 ug/dL (ref 28–170)
SATURATION RATIOS: 9 % — AB (ref 10.4–31.8)
TIBC: 386 ug/dL (ref 250–450)
UIBC: 350 ug/dL

## 2016-07-17 LAB — PROTIME-INR
INR: 1.05
Prothrombin Time: 13.7 seconds (ref 11.4–15.2)

## 2016-07-17 LAB — VITAMIN B12: Vitamin B-12: 2765 pg/mL — ABNORMAL HIGH (ref 180–914)

## 2016-07-17 LAB — CBC
HCT: 23.6 % — CL (ref 36.0–46.0)
Hemoglobin: 7.7 g/dL — CL (ref 12.0–15.0)
MCHC: 32.5 g/dL (ref 30.0–36.0)
MCV: 95.1 fl (ref 78.0–100.0)
Platelets: 225 10*3/uL (ref 150.0–400.0)
RBC: 2.48 Mil/uL — AB (ref 3.87–5.11)
RDW: 14.5 % (ref 11.5–15.5)
WBC: 5.2 10*3/uL (ref 4.0–10.5)

## 2016-07-17 LAB — RETICULOCYTES
RBC.: 2.64 MIL/uL — ABNORMAL LOW (ref 3.87–5.11)
Retic Count, Absolute: 169 10*3/uL (ref 19.0–186.0)
Retic Ct Pct: 6.4 % — ABNORMAL HIGH (ref 0.4–3.1)

## 2016-07-17 LAB — POC OCCULT BLOOD, ED: FECAL OCCULT BLD: NEGATIVE

## 2016-07-17 LAB — PREPARE RBC (CROSSMATCH)

## 2016-07-17 LAB — ABO/RH: ABO/RH(D): A POS

## 2016-07-17 MED ORDER — SODIUM CHLORIDE 0.9 % IV SOLN
Freq: Once | INTRAVENOUS | Status: AC
  Administered 2016-07-17: 22:00:00 via INTRAVENOUS

## 2016-07-17 NOTE — Telephone Encounter (Signed)
Given her most recent decrease and the fact that she is on blood thinners and has the symptomatic dizziness I have concern that she may have bleeding going on. I would recommend follow up with the ED to ensure that she is not having additional bleeding.

## 2016-07-17 NOTE — ED Triage Notes (Signed)
Pt had a fall on 11/12 and hit her head.  Pt had excessive bleeding from wound at that time and was seen here.  Pt went for follow up visit with her MD today and was told Hgb is 7.7 and was told to come to ED

## 2016-07-17 NOTE — Telephone Encounter (Signed)
Critical lab work with hemoglobin 7.7 and hematocrit 23.6  Please advise

## 2016-07-17 NOTE — Patient Instructions (Addendum)
Thank you for choosing [facility].  SUMMARY AND INSTRUCTIONS:  Clean with soap and water.  Avoid scrubbing.  If bleeding occurs apply direct pressure.  No need for antibiotic cream at this time.   Follow up:  If your symptoms worsen or fail to improve, please contact our office for further instruction, or in case of emergency go directly to the emergency room at the closest medical facility.    Wound Closure Removal The staples, stitches, or skin adhesives that were used to close your skin have been removed. You will need to continue the care described here until the wound is completely healed and your health care provider confirms that wound care can be stopped. How do I care for my wound? How you care for your wound after the wound closure has been removed depends on the kind of wound closure you had. Stitches or Staples  Keep the wound site dry and clean. Do not soak it in water.  If skin adhesive strips were applied after the staples were removed, they will begin to peel off in a few days. Allow them to remain in place until they fall off on their own.  If you still have a bandage (dressing), change it at least once a day or as directed by your health care provider. If the dressing sticks, pour warm, sterile water over it until it loosens and can be removed without pulling apart the wound edges. Pat the area dry with a soft, clean towel. Do not rub the wound because that may cause bleeding.  Apply cream or ointment that stops the growth of bacteria (antibacterial cream or antibacterial ointment) only if your health care provider has directed you to do so.  Place a nonstick bandage over the wound to prevent the dressing from sticking.  Cover the nonstick bandage with a new dressing as directed by your health care provider.  If the bandage becomes wet or dirty or it develops a bad smell, change it as soon as possible.  Take medicines only as directed by your health care  provider. Adhesive Strips or Glue  Adhesive strips and glue peel off on their own.  Leave adhesive strips and glue in place until they fall off. Are there any bathing restrictions once my wound closure is removed? Do not take baths, swim, or use a hot tub until your health care provider approves. How can I decrease the size of my scar? How your scar heals and the size of your scar depend on many factors, such as your age, the type of scar you have, and genetic factors. The following may help decrease the size of your scar:  Sunscreen. Use sunscreen with a sun protection factor (SPF) of at least 15 when out in the sun. Reapply the sunscreen every two hours.  Friction massage. Once your wound is completely healed, you can gently massage the scarred area. This can decrease scar thickness. When should I seek help? Seek help if:  You have a fever.  You have chills.  You have drainage, redness, swelling, or pain at your wound.  There is a bad smell coming from your wound.  Your wound edges open up or do not stay closed after the wound closure has been removed. This information is not intended to replace advice given to you by your health care provider. Make sure you discuss any questions you have with your health care provider. Document Released: 07/26/2008 Document Revised: 01/25/2016 Document Reviewed: 12/29/2013 Elsevier Interactive Patient Education  2017 [employer]   

## 2016-07-17 NOTE — ED Provider Notes (Signed)
[facility]-EMERGENCY DEPT Provider Note   CSN: [id number] Arrival date & time: 07/17/16  1655     History   Chief Complaint Chief Complaint  Patient presents with  . Abnormal Lab    HPI Patricia Andrews is a 82 y.o. female.  HPI Pt was seen in the emergency room on 11/12 after a fall at home.  She sustained a laceration to the back of her head.  Pt lost a large amount of blood at home according to the family.  Her laceration was repaired and her bleeding stopped.  Her Hgb was checked and it was 8.2. Pt was released to go home.   She followed up with her primary care doctor today and her hemoglobin was lower. She was sent to the emergency room for evaluation.  Patient denies any bleeding from the wound. It has already healed up and her staples were removed. She has noticed some dark stools but she was placed on iron. She has not noticed any dark blood in her stools.  Patient has felt somewhat lightheaded. She denies any chest pain or shortness of breath. Past Medical History:  Diagnosis Date  . Allergic rhinitis   . Anemia   . Arthritis   . Depression    anxiety overlap  . Diabetes mellitus, type 2 (HCC)   . Hyperlipidemia   . Hypertension   . Osteoporosis, senile    accidental fall w/ pelvic fx 2006, L wrist fx 10/2013  . Pelvis fracture (HCC)   . Stroke [facility](HCC) 1979   R MCA>>residual spastic L HP; TIA 06/2011    Patient Active Problem List   Diagnosis Date Noted  . Fall 07/17/2016  . Laceration of head 07/11/2016  . Poor balance 07/11/2016  . Upper back pain 07/11/2016  . Chronic fatigue 01/16/2016  . Distal radius fracture   . Right wrist fracture 05/14/2015  . Onychomycosis 02/04/2013  . Pain due to onychomycosis of toenail 02/04/2013  . Diabetes type 2, controlled (HCC) 09/20/2011  . Hypertension 09/20/2011  . Hyperlipidemia 09/20/2011  . Senile osteoporosis 09/20/2011  . Depression with anxiety 09/20/2011  . Anemia 09/20/2011  . CVA, old, hemiparesis (HCC)      Past Surgical History:  Procedure Laterality Date  . ABDOMINAL HYSTERECTOMY  1960   Fibroid tumors  . COLONOSCOPY  06/06/2012   Procedure: COLONOSCOPY;  Surgeon: Louis Meckelobert D Kaplan, MD;  Location: [facility];  Service: Endoscopy;  Laterality: N/A;  Rm 1501. Pt to be discharged after procedure. Patient was only admitted for prep.    OB History    No data available       Home Medications    Prior to Admission medications   Medication Sig Start Date End Date Taking? Authorizing Provider  acetaminophen (TYLENOL) 500 MG tablet Take 500 mg by mouth every 6 (six) hours as needed for mild pain.   Yes Historical Provider, MD  CARTIA XT 120 MG 24 hr capsule TAKE 1 CAPSULE BY MOUTH  DAILY 07/13/16  Yes Pincus SanesStacy J Burns, MD  cholecalciferol (VITAMIN D) 1000 UNITS tablet Take 1,000 Units by mouth daily.   Yes Historical Provider, MD  citalopram (CELEXA) 20 MG tablet Take 1 tablet (20 mg total) by mouth daily. 01/16/16  Yes Pincus SanesStacy J Burns, MD  clopidogrel (PLAVIX) 75 MG tablet TAKE 1 TABLET BY MOUTH  DAILY WITH BREAKFAST 07/13/16  Yes Pincus SanesStacy J Burns, MD  denosumab (PROLIA) 60 MG/ML SOLN injection Inject 60 mg into the skin every 6 (six) months. Administer in  upper arm, thigh, or abdomen  Usually take on the 23rd of each month   Yes Historical Provider, MD  ferrous sulfate (SLOW FE) 160 (50 Fe) MG TBCR SR tablet Take 1 tablet (160 mg total) by mouth daily. 07/11/16  Yes Pincus Sanes, MD  hydroxypropyl methylcellulose / hypromellose (ISOPTO TEARS / GONIOVISC) 2.5 % ophthalmic solution Place 1 drop into both eyes as needed for dry eyes (dry eyes).   Yes Historical Provider, MD  insulin lispro (HUMALOG KWIKPEN) 100 UNIT/ML KiwkPen Inject into the skin 3 (three) times daily. (Per sliding Scale) 151-200 = 2 units, 201-250 = 4 units, 251-300 = 6 units 301-350 = 8 units, 351-400 = 10 units   Yes Historical Provider, MD  mirtazapine (REMERON) 15 MG tablet TAKE 1 TABLET BY MOUTH AT  BEDTIME 06/12/16  Yes Pincus Sanes, MD  olmesartan (BENICAR) 20 MG tablet TAKE 1 TABLET BY MOUTH  DAILY 07/13/16  Yes Pincus Sanes, MD  Omega-3 Fatty Acids (FISH OIL PO) Take 1 capsule by mouth every morning.   Yes Historical Provider, MD  simvastatin (ZOCOR) 20 MG tablet TAKE 1 TABLET BY MOUTH AT  BEDTIME 07/13/16  Yes Pincus Sanes, MD  triamcinolone (KENALOG) 0.025 % ointment Apply 1 application topically 2 (two) times daily. 01/16/16  Yes Pincus Sanes, MD  vitamin B-12 (CYANOCOBALAMIN) 100 MCG tablet Take 100 mcg by mouth daily.   Yes Historical Provider, MD  zolpidem (AMBIEN) 5 MG tablet Take 1 tablet (5 mg total) by mouth at bedtime as needed. For sleep. 04/13/15  Yes Newt Lukes, MD  baclofen (LIORESAL) 10 MG tablet Take 1 tablet (10 mg total) by mouth 3 (three) times daily. Patient not taking: Reported on 07/17/2016 08/23/15   Pincus Sanes, MD  glimepiride (AMARYL) 2 MG tablet Take 1 tablet by mouth  daily with breakfast Patient not taking: Reported on 07/17/2016 12/14/15   Pincus Sanes, MD  Insulin Pen Needle 32G X 4 MM MISC Use to administer insulin four times a day Dx E11.9 02/01/16   Pincus Sanes, MD  traMADol (ULTRAM) 50 MG tablet Take 1 tablet (50 mg total) by mouth every 6 (six) hours as needed. 05/16/15   Alba Cory, MD    Family History Family History  Problem Relation Age of Onset  . Arthritis Mother   . Arthritis Father     Social History Social History  Substance Use Topics  . Smoking status: Never Smoker  . Smokeless tobacco: Never Used  . Alcohol use No     Allergies   Patient has no known allergies.   Review of Systems Review of Systems   Physical Exam Updated Vital Signs BP 156/63   Pulse 68   Temp 98.1 F (36.7 C) (Oral)   Resp 15   Ht 5\' 2"  (1.575 m)   Wt 52.6 kg   SpO2 100%   BMI 21.22 kg/m   Physical Exam  Constitutional: No distress.  Elderly, frail  HENT:  Head: Normocephalic and atraumatic.  Right Ear: External ear normal.  Left Ear:  External ear normal.  Eyes: Conjunctivae are normal. Right eye exhibits no discharge. Left eye exhibits no discharge. No scleral icterus.  Neck: Neck supple. No tracheal deviation present.  Cardiovascular: Normal rate, regular rhythm and intact distal pulses.   Pulmonary/Chest: Effort normal and breath sounds normal. No stridor. No respiratory distress. She has no wheezes. She has no rales.  Abdominal: Soft. Bowel sounds are normal.  She exhibits no distension. There is no tenderness. There is no rebound and no guarding.  Genitourinary:  Genitourinary Comments: Brown stool, no bright red blood, no melena  Musculoskeletal: She exhibits no edema or tenderness.  Neurological: She is alert. She has normal strength. No cranial nerve deficit (no facial droop, extraocular movements intact, no slurred speech) or sensory deficit. She exhibits normal muscle tone. She displays no seizure activity. Coordination normal.  Skin: Skin is warm and dry. No rash noted.  Psychiatric: She has a normal mood and affect.  Nursing note and vitals reviewed.    ED Treatments / Results  Labs (all labs ordered are listed, but only abnormal results are displayed) Labs Reviewed  CBC WITH DIFFERENTIAL/PLATELET - Abnormal; Notable for the following:       Result Value   RBC 2.43 (*)    Hemoglobin 7.4 (*)    HCT 23.4 (*)    All other components within normal limits  COMPREHENSIVE METABOLIC PANEL - Abnormal; Notable for the following:    Sodium 134 (*)    Potassium 6.3 (*)    Glucose, Bld 221 (*)    BUN 33 (*)    Creatinine, Ser 1.48 (*)    ALT 12 (*)    GFR calc non Af Amer 30 (*)    GFR calc Af Amer 35 (*)    All other components within normal limits  POTASSIUM - Abnormal; Notable for the following:    Potassium 5.7 (*)    All other components within normal limits  VITAMIN B12 - Abnormal; Notable for the following:    Vitamin B-12 2,765 (*)    All other components within normal limits  IRON AND TIBC -  Abnormal; Notable for the following:    Saturation Ratios 9 (*)    All other components within normal limits  RETICULOCYTES - Abnormal; Notable for the following:    Retic Ct Pct 6.4 (*)    RBC. 2.64 (*)    All other components within normal limits  APTT  PROTIME-INR  FOLATE  FERRITIN  POC OCCULT BLOOD, ED  PREPARE RBC (CROSSMATCH)  TYPE AND SCREEN  ABO/RH    EKG  EKG Interpretation  Date/Time:  Tuesday July 17 2016 21:37:56 EST Ventricular Rate:  68 PR Interval:    QRS Duration: 74 QT Interval:  402 QTC Calculation: 428 R Axis:   19 Text Interpretation:  Sinus rhythm trigeminy resolved since last tracing Confirmed by Nene Aranas  MD-J, Maziah Keeling ([id number]) on 07/17/2016 9:40:47 PM       Radiology No results found.  Procedures Procedures (including critical care time)  Medications Ordered in ED Medications  0.9 %  sodium chloride infusion ( Intravenous New Bag/Given 07/17/16 2153)     Initial Impression / Assessment and Plan / ED Course  I have reviewed the triage vital signs and the nursing notes.  Pertinent labs & imaging results that were available during my care of the patient were reviewed by me and considered in my medical decision making (see chart for details).  Clinical Course as of Jul 17 2314  Tue Jul 17, 2016  2015 Patient's hemoglobin is now 7.4. This is decreased from 8.2 at her previous ED visit  [JK]  2016 Patient denies any persistent bleeding. I suspect her anemia is most likely related to equilibration from the acute blood loss that she had at the time of her injury.  Patient is symptomatic however. I will send off a fecal occult blood test and transfuse  her unit of blood. Patient's potassium level is elevated. I suspect this is related to hemolysis. I will repeat a potassium level  [JK]  2140 No EKG changes associated with the K.  Discussed with daughter about having her hold her benicar which could be a factor.  Pt still would like to go home.  Will  monitor during her transfusion.  No signs of active bleeding.  Fecal occult is negative.    [JK]  2314 Pt is tolerating her blood transfusion.   She feels well.  Updated family and patient.  She still would prefer to go home.  Anticipate dc after transfusion is complete.  [JK]    Clinical Course User Index [JK] Linwood Dibbles, MD      Final Clinical Impressions(s) / ED Diagnoses   Final diagnoses:  Symptomatic anemia  Hyperkalemia    New Prescriptions New Prescriptions   No medications on file     Linwood Dibbles, MD 07/17/16 2315

## 2016-07-17 NOTE — ED Notes (Signed)
Pt states she went to the doctor today to get staples removed from a previous fall last week in which she was seen here for. PCP told her that her hgb was low and needed to be seen here. Pt currently taking Plavix and states she did take it this morning. Pt denies SOB or dizziness. Cap refill <3 secs. Pulses strong.

## 2016-07-17 NOTE — Discharge Instructions (Signed)
Hold your olmesartan until you talk to your primary care doctor because of the high potassium level, follow up with your doctor next week to check on your anemia, return to the ED as needed for worsening symptoms

## 2016-07-17 NOTE — Progress Notes (Signed)
Subjective:    Patient ID: Patricia Andrews, female    DOB: May 31, 1926, 82 y.o.   MRN: [id number]  Chief Complaint  Patient presents with  . Laceration    HPI:  Patricia Andrews is a 82 y.o. female who  has a past medical history of Allergic rhinitis; Anemia; Arthritis; Depression; Diabetes mellitus, type 2 (HCC); Hyperlipidemia; Hypertension; Osteoporosis, senile; Pelvis fracture (HCC); and Stroke (HCC) (1979). and presents today for an ED follow up.   Recently evaluated in the emergency department following a fall and noted to have a 2.5 cm laceration to the occipital area which was repaired with staples. Denies any fevers or signs of infection. Patient reports the site has been clean, dry and remained intact. Denies headaches decreased concentration or visual changes. Does endorse some balance issues, but per family this was occurring prior to fall.   No Known Allergies    Outpatient Medications Prior to Visit  Medication Sig Dispense Refill  . baclofen (LIORESAL) 10 MG tablet Take 1 tablet (10 mg total) by mouth 3 (three) times daily. (Patient not taking: Reported on 07/17/2016) 30 each 0  . CARTIA XT 120 MG 24 hr capsule TAKE 1 CAPSULE BY MOUTH  DAILY 90 capsule 1  . cholecalciferol (VITAMIN D) 1000 UNITS tablet Take 1,000 Units by mouth daily.    . citalopram (CELEXA) 20 MG tablet Take 1 tablet (20 mg total) by mouth daily. 90 tablet 3  . clopidogrel (PLAVIX) 75 MG tablet TAKE 1 TABLET BY MOUTH  DAILY WITH BREAKFAST 90 tablet 1  . denosumab (PROLIA) 60 MG/ML SOLN injection Inject 60 mg into the skin every 6 (six) months. Administer in upper arm, thigh, or abdomen  Usually take on the 23rd of each month    . ferrous sulfate (SLOW FE) 160 (50 Fe) MG TBCR SR tablet Take 1 tablet (160 mg total) by mouth daily. 30 each 5  . glimepiride (AMARYL) 2 MG tablet Take 1 tablet by mouth  daily with breakfast (Patient not taking: Reported on 07/17/2016) 90 tablet 1  . hydroxypropyl  methylcellulose / hypromellose (ISOPTO TEARS / GONIOVISC) 2.5 % ophthalmic solution Place 1 drop into both eyes as needed for dry eyes (dry eyes).    . insulin lispro (HUMALOG KWIKPEN) 100 UNIT/ML KiwkPen Inject into the skin 3 (three) times daily. (Per sliding Scale) 151-200 = 2 units, 201-250 = 4 units, 251-300 = 6 units 301-350 = 8 units, 351-400 = 10 units    . Insulin Pen Needle 32G X 4 MM MISC Use to administer insulin four times a day Dx E11.9 30 each 0  . mirtazapine (REMERON) 15 MG tablet TAKE 1 TABLET BY MOUTH AT  BEDTIME 90 tablet 1  . olmesartan (BENICAR) 20 MG tablet TAKE 1 TABLET BY MOUTH  DAILY 90 tablet 1  . Omega-3 Fatty Acids (FISH OIL PO) Take 1 capsule by mouth every morning.    . simvastatin (ZOCOR) 20 MG tablet TAKE 1 TABLET BY MOUTH AT  BEDTIME 90 tablet 1  . traMADol (ULTRAM) 50 MG tablet Take 1 tablet (50 mg total) by mouth every 6 (six) hours as needed. 15 tablet 0  . triamcinolone (KENALOG) 0.025 % ointment Apply 1 application topically 2 (two) times daily. 30 g 5  . vitamin B-12 (CYANOCOBALAMIN) 100 MCG tablet Take 100 mcg by mouth daily.    Marland Kitchen. zolpidem (AMBIEN) 5 MG tablet Take 1 tablet (5 mg total) by mouth at bedtime as needed. For sleep. 30 tablet  3   No facility-administered medications prior to visit.       Past Surgical History:  Procedure Laterality Date  . ABDOMINAL HYSTERECTOMY  1960   Fibroid tumors  . COLONOSCOPY  06/06/2012   Procedure: COLONOSCOPY;  Surgeon: Louis Meckelobert D Kaplan, MD;  Location: [facility];  Service: Endoscopy;  Laterality: N/A;  Rm 1501. Pt to be discharged after procedure. Patient was only admitted for prep.      Past Medical History:  Diagnosis Date  . Allergic rhinitis   . Anemia   . Arthritis   . Depression    anxiety overlap  . Diabetes mellitus, type 2 (HCC)   . Hyperlipidemia   . Hypertension   . Osteoporosis, senile    accidental fall w/ pelvic fx 2006, L wrist fx 10/2013  . Pelvis fracture (HCC)   . Stroke  [facility](HCC) 1979   R MCA>>residual spastic L HP; TIA 06/2011      Review of Systems  Constitutional: Negative for chills and fever.  Respiratory: Negative for chest tightness and shortness of breath.   Cardiovascular: Negative for chest pain, palpitations and leg swelling.  Skin: Positive for wound.  Neurological: Positive for dizziness. Negative for weakness and numbness.      Objective:    BP (!) 144/76 (BP Location: Left Arm, Patient Position: Sitting, Cuff Size: Normal)   Pulse 85   Resp 16   Ht 5\' 2"  (1.575 m)   Wt 116 lb (52.6 kg)   SpO2 98%   BMI 21.22 kg/m  Nursing note and vital signs reviewed.  Physical Exam  Constitutional: She is oriented to person, place, and time. She appears well-developed and well-nourished. No distress.  Eyes: Conjunctivae and EOM are normal. Pupils are equal, round, and reactive to light.  Cardiovascular: Normal rate, regular rhythm, normal heart sounds and intact distal pulses.   Pulmonary/Chest: Effort normal and breath sounds normal.  Neurological: She is alert and oriented to person, place, and time. No cranial nerve deficit.  Skin: Skin is warm and dry.  Laceration appears clean, dry and intact with good approximation and no evidence of infection.   Psychiatric: She has a normal mood and affect. Her behavior is normal. Judgment and thought content normal.       Assessment & Plan:   Problem List Items Addressed This Visit      Other   Laceration of head - Primary    Laceration appears to be well healing with no evidence of infection. Staples removed without complication. Does continue to have some dizziness which is concerning for possible concussive symptoms although no LOC noted. Previous CBC with Hgb of 8.2. Obtan CBC to recheck given fall with anticoagulation. Continue with basic wound care. Follow up for signs of infection or if symptoms worsen.       Relevant Orders   CBC (Completed)   Fall       I am having Patricia Andrews  maintain her cholecalciferol, Omega-3 Fatty Acids (FISH OIL PO), zolpidem, denosumab, traMADol, baclofen, glimepiride, citalopram, triamcinolone, insulin lispro, Insulin Pen Needle, mirtazapine, vitamin B-12, hydroxypropyl methylcellulose / hypromellose, ferrous sulfate, clopidogrel, simvastatin, olmesartan, and CARTIA XT.   Follow-up: Return if symptoms worsen or fail to improve.  Jeanine Luzalone, Junita Kubota, FNP

## 2016-07-17 NOTE — ED Notes (Signed)
Phlebotomy at bedside.

## 2016-07-17 NOTE — ED Notes (Addendum)
1 attempt for IV access unsuccessful, Suszanne ConnersBarbara RN attempt unsuccessful as well. IV team to get IV access

## 2016-07-17 NOTE — Telephone Encounter (Signed)
Pt and daughter are aware of results and to go to the ER.

## 2016-07-17 NOTE — ED Notes (Signed)
ED Provider at bedside. 

## 2016-07-17 NOTE — ED Notes (Signed)
Lab called to report critical lab potassium of 6.3. Dr Lynelle DoctorKnapp notified.

## 2016-07-17 NOTE — Assessment & Plan Note (Addendum)
Laceration appears to be well healing with no evidence of infection. Staples removed without complication. Does continue to have some dizziness which is concerning for possible concussive symptoms although no LOC noted. Previous CBC with Hgb of 8.2. Obtan CBC to recheck given fall with anticoagulation. Continue with basic wound care. Follow up for signs of infection or if symptoms worsen.

## 2016-07-17 NOTE — ED Notes (Signed)
Unsuccessful attempt at IV start 

## 2016-07-18 LAB — TYPE AND SCREEN
ABO/RH(D): A POS
ANTIBODY SCREEN: NEGATIVE
UNIT DIVISION: 0

## 2016-07-24 ENCOUNTER — Ambulatory Visit (INDEPENDENT_AMBULATORY_CARE_PROVIDER_SITE_OTHER): Payer: Medicare Other | Admitting: Internal Medicine

## 2016-07-24 ENCOUNTER — Encounter: Payer: Self-pay | Admitting: Internal Medicine

## 2016-07-24 ENCOUNTER — Other Ambulatory Visit (INDEPENDENT_AMBULATORY_CARE_PROVIDER_SITE_OTHER): Payer: Medicare Other

## 2016-07-24 VITALS — BP 130/66 | HR 81 | Temp 98.0°F | Resp 16 | Wt 116.2 lb

## 2016-07-24 DIAGNOSIS — I10 Essential (primary) hypertension: Secondary | ICD-10-CM | POA: Diagnosis not present

## 2016-07-24 DIAGNOSIS — D649 Anemia, unspecified: Secondary | ICD-10-CM

## 2016-07-24 DIAGNOSIS — E875 Hyperkalemia: Secondary | ICD-10-CM

## 2016-07-24 DIAGNOSIS — M810 Age-related osteoporosis without current pathological fracture: Secondary | ICD-10-CM | POA: Diagnosis not present

## 2016-07-24 DIAGNOSIS — R2689 Other abnormalities of gait and mobility: Secondary | ICD-10-CM

## 2016-07-24 LAB — COMPREHENSIVE METABOLIC PANEL
ALT: 9 U/L (ref 0–35)
AST: 12 U/L (ref 0–37)
Albumin: 4.1 g/dL (ref 3.5–5.2)
Alkaline Phosphatase: 60 U/L (ref 39–117)
BUN: 25 mg/dL — ABNORMAL HIGH (ref 6–23)
CALCIUM: 8.6 mg/dL (ref 8.4–10.5)
CHLORIDE: 106 meq/L (ref 96–112)
CO2: 22 meq/L (ref 19–32)
CREATININE: 1.25 mg/dL — AB (ref 0.40–1.20)
GFR: 51.95 mL/min — AB (ref >60.00)
GLUCOSE: 281 mg/dL — AB (ref 70–99)
Potassium: 4.6 mEq/L (ref 3.5–5.1)
Sodium: 139 mEq/L (ref 135–145)
Total Bilirubin: 0.3 mg/dL (ref 0.2–1.2)
Total Protein: 7.2 g/dL (ref 6.0–8.3)

## 2016-07-24 MED ORDER — DENOSUMAB 60 MG/ML ~~LOC~~ SOLN
60.0000 mg | Freq: Once | SUBCUTANEOUS | Status: AC
  Administered 2016-07-24: 60 mg via SUBCUTANEOUS

## 2016-07-24 NOTE — Assessment & Plan Note (Signed)
prolia injection today 

## 2016-07-24 NOTE — Assessment & Plan Note (Signed)
GI eval in 2013 neg, fecal occult neg in ED Received 1 unit of blood - feels better, but Benicar was also d/c'd and that may have been contributing to some of her symptoms Tolerating slow Fe well so will continue Acute anemia was secondary to head laceration from fall - there was significant bleeding, but chronic anemia may be ? Anemia of chronic disease with some iron def anemia - ? Genetic anemia - has been fairly stable Will refer to heme due to anemia possibly causing some symptoms - balance concerns /lightheadedness Cbc today

## 2016-07-24 NOTE — Progress Notes (Signed)
Subjective:    Patient ID: Patricia Andrews, female    DOB: 02-05-1926, 82 y.o.   MRN: [id number]  HPI The patient is here for follow up.  She went to the ED on 11/21 for symptomatic anemia.  She had a drop in Hb and receieved a transfusion.  Her fecal occult was negative.  She had elevated potassium without EKG changes and was advised to hold the Benicar.  Since leaving the hospital she has felt much better.   Anemia:  She had a full GI work up in 2013 that was negative for source of bleeding.  Fecal occult was negative in the ED.  She received one unit of blood on 11/21 after drop in Hb.  Her iron was on the lower side of normal and she is taking slow Fe.  She denies constipation.   Chronic poor balance, history of falls:  I did refer her for Home PT at her last visit.  She feels better after the visit to the ED ( received one unit of blood and Benicar was stopped).  She still has poor balance.  She uses a cane.  Her left heel feels heavy at times and her foot does not feel flat. She feels more unsteady after sitting long periods of time when she gets up.  She thinks the position she has in her foot makes a difference.  She feels a tickling sensation on the bottom of her foot at times.   Hypertension: She is taking her medication daily. She is compliant with a low sodium diet.  She denies chest pain, palpitations, edema, shortness of breath and regular headaches. She is not exercising regularly.  She does not monitor her blood pressure at home, but they can start monitoring it.       Medications and allergies reviewed with patient and updated if appropriate.  Patient Active Problem List   Diagnosis Date Noted  . Fall 07/17/2016  . Laceration of head 07/11/2016  . Poor balance 07/11/2016  . Upper back pain 07/11/2016  . Chronic fatigue 01/16/2016  . Distal radius fracture   . Right wrist fracture 05/14/2015  . Onychomycosis 02/04/2013  . Pain due to onychomycosis of toenail  02/04/2013  . Diabetes type 2, controlled (HCC) 09/20/2011  . Hypertension 09/20/2011  . Hyperlipidemia 09/20/2011  . Senile osteoporosis 09/20/2011  . Depression with anxiety 09/20/2011  . Anemia 09/20/2011  . CVA, old, hemiparesis (HCC)     Current Outpatient Prescriptions on File Prior to Visit  Medication Sig Dispense Refill  . acetaminophen (TYLENOL) 500 MG tablet Take 500 mg by mouth every 6 (six) hours as needed for mild pain.    . baclofen (LIORESAL) 10 MG tablet Take 1 tablet (10 mg total) by mouth 3 (three) times daily. 30 each 0  . CARTIA XT 120 MG 24 hr capsule TAKE 1 CAPSULE BY MOUTH  DAILY 90 capsule 1  . cholecalciferol (VITAMIN D) 1000 UNITS tablet Take 1,000 Units by mouth daily.    . citalopram (CELEXA) 20 MG tablet Take 1 tablet (20 mg total) by mouth daily. 90 tablet 3  . clopidogrel (PLAVIX) 75 MG tablet TAKE 1 TABLET BY MOUTH  DAILY WITH BREAKFAST 90 tablet 1  . denosumab (PROLIA) 60 MG/ML SOLN injection Inject 60 mg into the skin every 6 (six) months. Administer in upper arm, thigh, or abdomen  Usually take on the 23rd of each month    . ferrous sulfate (SLOW FE) 160 (50 Fe) MG  TBCR SR tablet Take 1 tablet (160 mg total) by mouth daily. 30 each 5  . glimepiride (AMARYL) 2 MG tablet Take 1 tablet by mouth  daily with breakfast 90 tablet 1  . hydroxypropyl methylcellulose / hypromellose (ISOPTO TEARS / GONIOVISC) 2.5 % ophthalmic solution Place 1 drop into both eyes as needed for dry eyes (dry eyes).    . insulin lispro (HUMALOG KWIKPEN) 100 UNIT/ML KiwkPen Inject into the skin 3 (three) times daily. (Per sliding Scale) 151-200 = 2 units, 201-250 = 4 units, 251-300 = 6 units 301-350 = 8 units, 351-400 = 10 units    . Insulin Pen Needle 32G X 4 MM MISC Use to administer insulin four times a day Dx E11.9 30 each 0  . mirtazapine (REMERON) 15 MG tablet TAKE 1 TABLET BY MOUTH AT  BEDTIME 90 tablet 1  . Omega-3 Fatty Acids (FISH OIL PO) Take 1 capsule by mouth every  morning.    . simvastatin (ZOCOR) 20 MG tablet TAKE 1 TABLET BY MOUTH AT  BEDTIME 90 tablet 1  . traMADol (ULTRAM) 50 MG tablet Take 1 tablet (50 mg total) by mouth every 6 (six) hours as needed. 15 tablet 0  . triamcinolone (KENALOG) 0.025 % ointment Apply 1 application topically 2 (two) times daily. 30 g 5  . vitamin B-12 (CYANOCOBALAMIN) 100 MCG tablet Take 100 mcg by mouth daily.    Marland Kitchen. zolpidem (AMBIEN) 5 MG tablet Take 1 tablet (5 mg total) by mouth at bedtime as needed. For sleep. 30 tablet 3  . olmesartan (BENICAR) 20 MG tablet TAKE 1 TABLET BY MOUTH  DAILY (Patient not taking: Reported on 07/24/2016) 90 tablet 1   No current facility-administered medications on file prior to visit.     Past Medical History:  Diagnosis Date  . Allergic rhinitis   . Anemia   . Arthritis   . Depression    anxiety overlap  . Diabetes mellitus, type 2 (HCC)   . Hyperlipidemia   . Hypertension   . Osteoporosis, senile    accidental fall w/ pelvic fx 2006, L wrist fx 10/2013  . Pelvis fracture (HCC)   . Stroke [facility](HCC) 1979   R MCA>>residual spastic L HP; TIA 06/2011    Past Surgical History:  Procedure Laterality Date  . ABDOMINAL HYSTERECTOMY  1960   Fibroid tumors  . COLONOSCOPY  06/06/2012   Procedure: COLONOSCOPY;  Surgeon: Patricia Meckelobert D Kaplan, MD;  Location: [facility];  Service: Endoscopy;  Laterality: N/A;  Rm 1501. Pt to be discharged after procedure. Patient was only admitted for prep.    Social History   Social History  . Marital status: Single    Spouse name: N/A  . Number of children: 2  . Years of education: N/A   Occupational History  . RETIRED Retired   Social History Main Topics  . Smoking status: Never Smoker  . Smokeless tobacco: Never Used  . Alcohol use No  . Drug use: No  . Sexual activity: No   Other Topics Concern  . Not on file   Social History Narrative   Lives alone in independent living, single. Never married   Supportive daughter Patricia Andrews -    Family  History  Problem Relation Age of Onset  . Arthritis Mother   . Arthritis Father     Review of Systems  Constitutional: Negative for appetite change (appetite is good).  Respiratory: Negative for cough, shortness of breath and wheezing.   Cardiovascular: Negative for chest pain,  palpitations and leg swelling.  Gastrointestinal: Negative for abdominal pain, blood in stool and constipation.  Musculoskeletal: Positive for gait problem (balance is poor).  Neurological: Positive for dizziness (less dizzy ). Negative for light-headedness and headaches.  Psychiatric/Behavioral: Negative for sleep disturbance.       Objective:   Vitals:   07/24/16 1034  BP: 130/66  Pulse: 81  Resp: 16  Temp: 98 F (36.7 C)   Filed Weights   07/24/16 1034  Weight: 116 lb 4 oz (52.7 kg)   Body mass index is 21.26 kg/m.   Physical Exam    Constitutional: Appears well-developed and well-nourished. No distress.  HENT:  Head: Normocephalic and atraumatic.  Neck: Neck supple. No tracheal deviation present. No thyromegaly present.  No cervical lymphadenopathy Cardiovascular: Normal rate, regular rhythm and normal heart sounds.   No murmur heard. No carotid bruit .  No edema Pulmonary/Chest: Effort normal and breath sounds normal. No respiratory distress. No has no wheezes. No rales.  Skin: Skin is warm and dry. Not diaphoretic.  Psychiatric: Normal mood and affect. Behavior is normal.      Assessment & Plan:    See Problem List for Assessment and Plan of chronic medical problems.

## 2016-07-24 NOTE — Assessment & Plan Note (Signed)
?   benicar contributing - d/c'd ? Related to hemolysis Continue off benicar cmp today

## 2016-07-24 NOTE — Progress Notes (Signed)
Pre visit review using our clinic review tool, if applicable. No additional management support is needed unless otherwise documented below in the visit note. 

## 2016-07-24 NOTE — Assessment & Plan Note (Signed)
Likely multifactorial Has left sided weakness from CVA and has nerve damage in left foot likely Anemia and Benicar may have contributed Referred for home PT Fall precautions discussed

## 2016-07-24 NOTE — Assessment & Plan Note (Signed)
Benicar stopped - this was likely causing some side effects in addition to anemia  BP controlled today, so will stay off Benicar Continue other meds Monitor BP at home F/u in 6 months, sooner if needed cmp today

## 2016-07-24 NOTE — Patient Instructions (Addendum)
  Test(s) ordered today. Your results will be released to MyChart (or called to you) after review, usually within 72hours after test completion. If any changes need to be made, you will be notified at that same time.    Your prolia injection was today.   Medications reviewed and updated.  No changes recommended at this time.   A referral was ordered for a blood doctor to evaluate your anemia.   Please followup in 6 months

## 2016-07-25 LAB — CBC WITH DIFFERENTIAL/PLATELET
BASOS PCT: 0.8 % (ref 0.0–3.0)
Basophils Absolute: 0 10*3/uL (ref 0.0–0.1)
Eosinophils Absolute: 0.1 10*3/uL (ref 0.0–0.7)
Eosinophils Relative: 1.5 % (ref 0.0–5.0)
HEMATOCRIT: 32.6 % — AB (ref 36.0–46.0)
Hemoglobin: 10.4 g/dL — ABNORMAL LOW (ref 12.0–15.0)
LYMPHS PCT: 30.9 % (ref 12.0–46.0)
Lymphs Abs: 1.4 10*3/uL (ref 0.7–4.0)
MCHC: 31.9 g/dL (ref 30.0–36.0)
MCV: 96.4 fl (ref 78.0–100.0)
MONOS PCT: 7.7 % (ref 3.0–12.0)
Monocytes Absolute: 0.3 10*3/uL (ref 0.1–1.0)
NEUTROS ABS: 2.6 10*3/uL (ref 1.4–7.7)
Neutrophils Relative %: 59.1 % (ref 43.0–77.0)
PLATELETS: 210 10*3/uL (ref 150.0–400.0)
RBC: 3.39 Mil/uL — ABNORMAL LOW (ref 3.87–5.11)
RDW: 14.9 % (ref 11.5–15.5)
WBC: 4.5 10*3/uL (ref 4.0–10.5)

## 2016-08-03 ENCOUNTER — Telehealth: Payer: Self-pay | Admitting: Internal Medicine

## 2016-08-03 NOTE — Telephone Encounter (Signed)
Requesting verbal order to continue PT for 1 week 1, 2 week 4, 1 week 1 to address balance, gate and strength.

## 2016-08-06 ENCOUNTER — Telehealth: Payer: Self-pay | Admitting: Hematology

## 2016-08-06 ENCOUNTER — Encounter: Payer: Self-pay | Admitting: Hematology

## 2016-08-06 MED ORDER — ATORVASTATIN CALCIUM 20 MG PO TABS: 20.0000 mg | ORAL_TABLET | Freq: Every day | ORAL | 3 refills | Status: DC

## 2016-08-06 NOTE — Telephone Encounter (Signed)
Please advise 

## 2016-08-06 NOTE — Telephone Encounter (Signed)
Ok for PT.   Change to lipitor 20 mg daily.  New rx pending - send if ok with patient

## 2016-08-06 NOTE — Telephone Encounter (Signed)
Pt's daughter confirmed appt, verified demo and insurance, mailed pt letter, and faxed referring provider appt date/time °

## 2016-08-06 NOTE — Telephone Encounter (Signed)
Spoke with pts daughter. She was okay with us sending lipitor to her pharm. Sent to mail order.

## 2016-08-06 NOTE — Telephone Encounter (Signed)
Patricia Andrews w/ [facility] is calling to follow up on verbals bel;ow/   Also found a level 1 drug interaction with cardia xt and simvastatin

## 2016-08-07 ENCOUNTER — Telehealth: Payer: Self-pay | Admitting: Emergency Medicine

## 2016-08-07 NOTE — Telephone Encounter (Signed)
[facility] and needs verbal orders to continue home health PT. Please advise thanks.

## 2016-08-08 NOTE — Telephone Encounter (Signed)
ok 

## 2016-08-08 NOTE — Telephone Encounter (Signed)
Please advise 

## 2016-08-08 NOTE — Telephone Encounter (Signed)
Spoke with Onalee Huaavid to give verbal orders.

## 2016-08-09 ENCOUNTER — Telehealth: Payer: Self-pay | Admitting: Emergency Medicine

## 2016-08-09 NOTE — Telephone Encounter (Signed)
Received fax from [facility]. Spoke with pts daughter and she stated that the pt did not need a glucose monitor, but did need test strips. Called [facility] and test strips are being sent to pt.

## 2016-08-13 ENCOUNTER — Telehealth: Payer: Self-pay | Admitting: Internal Medicine

## 2016-08-13 ENCOUNTER — Encounter: Payer: Self-pay | Admitting: Internal Medicine

## 2016-08-13 NOTE — Telephone Encounter (Signed)
Spoke with [facility]ley [facility], states that pts daughter asked her to check pts Hgb. I informed her of the message we got. She suggested that we have a CBC and BMP drawn to cover both. She will verify exactly what pts daughter was asking for for future reference. Verbal orders were given per MD.

## 2016-08-13 NOTE — Telephone Encounter (Signed)
Evette GeorgesHazel johnson called to advise that [facility] is going to call us to request verbals of lab work to check glucose. Patient is experiencing SX of dropping glucose like she has in the past. Jerrye BeaversHazel is asking that we get them notification asap so that they can get someone out today rather than tomorrow. She is asking that we call [facility] preemptively to order verbals on checking blood glucose in the home today

## 2016-08-15 ENCOUNTER — Telehealth: Payer: Self-pay | Admitting: Internal Medicine

## 2016-08-15 ENCOUNTER — Telehealth: Payer: Self-pay | Admitting: Emergency Medicine

## 2016-08-15 NOTE — Telephone Encounter (Signed)
Spoke with pts daughter to inform.  

## 2016-08-15 NOTE — Telephone Encounter (Signed)
[facility] for [facility] 2 week 2, 1 week 1, gave verbal orders per MD  [phone]

## 2016-08-15 NOTE — Telephone Encounter (Signed)
Her blood work shows that her anemia is stable - mild.   Her kidney function is stable.  Her sugar was 150.  Her electrolytes were normal.

## 2016-09-18 ENCOUNTER — Ambulatory Visit (HOSPITAL_BASED_OUTPATIENT_CLINIC_OR_DEPARTMENT_OTHER): Payer: Medicare Other

## 2016-09-18 ENCOUNTER — Encounter: Payer: Self-pay | Admitting: Hematology

## 2016-09-18 ENCOUNTER — Ambulatory Visit (HOSPITAL_BASED_OUTPATIENT_CLINIC_OR_DEPARTMENT_OTHER): Payer: Medicare Other | Admitting: Hematology

## 2016-09-18 ENCOUNTER — Telehealth: Payer: Self-pay | Admitting: Hematology

## 2016-09-18 VITALS — BP 184/87 | HR 75 | Temp 98.6°F | Resp 18 | Ht 62.0 in | Wt 113.9 lb

## 2016-09-18 DIAGNOSIS — I10 Essential (primary) hypertension: Secondary | ICD-10-CM | POA: Diagnosis not present

## 2016-09-18 DIAGNOSIS — E119 Type 2 diabetes mellitus without complications: Secondary | ICD-10-CM | POA: Diagnosis not present

## 2016-09-18 DIAGNOSIS — Z8673 Personal history of transient ischemic attack (TIA), and cerebral infarction without residual deficits: Secondary | ICD-10-CM

## 2016-09-18 DIAGNOSIS — M810 Age-related osteoporosis without current pathological fracture: Secondary | ICD-10-CM | POA: Diagnosis not present

## 2016-09-18 DIAGNOSIS — D649 Anemia, unspecified: Secondary | ICD-10-CM

## 2016-09-18 LAB — COMPREHENSIVE METABOLIC PANEL
ALBUMIN: 4 g/dL (ref 3.5–5.0)
ALK PHOS: 81 U/L (ref 40–150)
ALT: 12 U/L (ref 0–55)
ANION GAP: 10 meq/L (ref 3–11)
AST: 16 U/L (ref 5–34)
BUN: 28.3 mg/dL — ABNORMAL HIGH (ref 7.0–26.0)
CALCIUM: 9.9 mg/dL (ref 8.4–10.4)
CO2: 23 mEq/L (ref 22–29)
Chloride: 103 mEq/L (ref 98–109)
Creatinine: 1.6 mg/dL — ABNORMAL HIGH (ref 0.6–1.1)
EGFR: 34 mL/min/{1.73_m2} — ABNORMAL LOW (ref >90)
Glucose: 236 mg/dl — ABNORMAL HIGH (ref 70–140)
Potassium: 4.6 mEq/L (ref 3.5–5.1)
Sodium: 137 mEq/L (ref 136–145)
TOTAL PROTEIN: 7.8 g/dL (ref 6.4–8.3)
Total Bilirubin: 0.39 mg/dL (ref 0.20–1.20)

## 2016-09-18 LAB — CBC & DIFF AND RETIC
BASO%: 0.2 % (ref 0.0–2.0)
Basophils Absolute: 0 10*3/uL (ref 0.0–0.1)
EOS ABS: 0.1 10*3/uL (ref 0.0–0.5)
EOS%: 2.9 % (ref 0.0–7.0)
HEMATOCRIT: 35.1 % (ref 34.8–46.6)
HGB: 11.3 g/dL — ABNORMAL LOW (ref 11.6–15.9)
IMMATURE RETIC FRACT: 6.5 % (ref 1.60–10.00)
LYMPH%: 33.1 % (ref 14.0–49.7)
MCH: 30.2 pg (ref 25.1–34.0)
MCHC: 32.2 g/dL (ref 31.5–36.0)
MCV: 93.9 fL (ref 79.5–101.0)
MONO#: 0.3 10*3/uL (ref 0.1–0.9)
MONO%: 6.8 % (ref 0.0–14.0)
NEUT#: 2.8 10*3/uL (ref 1.5–6.5)
NEUT%: 57 % (ref 38.4–76.8)
PLATELETS: 172 10*3/uL (ref 145–400)
RBC: 3.74 10*6/uL (ref 3.70–5.45)
RDW: 13.3 % (ref 11.2–14.5)
RETIC CT ABS: 67.32 10*3/uL (ref 33.70–90.70)
Retic %: 1.8 % (ref 0.70–2.10)
WBC: 4.9 10*3/uL (ref 3.9–10.3)
lymph#: 1.6 10*3/uL (ref 0.9–3.3)
nRBC: 0 % (ref 0–0)

## 2016-09-18 LAB — CHCC SMEAR

## 2016-09-18 LAB — LACTATE DEHYDROGENASE: LDH: 193 U/L (ref 125–245)

## 2016-09-18 NOTE — Telephone Encounter (Signed)
Gave patient avs report and appointments for February.  °

## 2016-09-18 NOTE — Progress Notes (Signed)
Marland Kitchen    HEMATOLOGY/ONCOLOGY CONSULTATION NOTE  Date of Service: 09/18/2016  Patient Care Team: Pincus Sanes, MD as PCP - General (Internal Medicine) Milas Gain, MD (Neurology) Myeong Christianne Dolin, DPM (Podiatry) Louis Meckel, MD (Gastroenterology)  CHIEF COMPLAINTS/PURPOSE OF CONSULTATION:  Chronic Anemia  HISTORY OF PRESENTING ILLNESS:   Patricia Andrews is a wonderful 83 y.o. female who has been referred to [place] by Dr .Pincus Sanes, MD for evaluation and management of chronic anemia.  Patient has a h/o HTN, HLD, CVA with left hemoparesis, recurrent falls, DM2 with chronic anemia who was seen in the ED on 07/08/2016 after a fall at home when she sustained a laceration on the back of her head and had a significant amount of blood loss. She subsequently presented to the ED on 07/17/2016 from her PCP's office with worsened anemia and hgb off 7.4 and received a PRBC transfusion. Her labs showed ferritin 34 with an iron saturation of 9%, nl folate and elevated B12 levels. She was started on slow Fe iron replacement. Rpt labs on 07/24/2016 showed a hgb level of 10.4 and 08/13/2016 with hgb of 10.4 with MCV of 97 Patient had no overt evidence of GI bleeding and apparently has previously had a GI workup in 2013 which was unrevealing.  Having some constipation with slow Fe and taking OTC stool softners Notes some cognitive issues and left TMJ swelling after her fall.  No weight loss. No other overt blood loss. Is on plavix for 2nd CVA prophylaxis. No bone pain.  MEDICAL HISTORY:  Past Medical History:  Diagnosis Date  . Allergic rhinitis   . Anemia   . Arthritis   . Depression    anxiety overlap  . Diabetes mellitus, type 2 (HCC)   . Hyperlipidemia   . Hypertension   . Osteoporosis, senile    accidental fall w/ pelvic fx 2006, L wrist fx 10/2013  . Pelvis fracture (HCC)   . Stroke [facility]) 1979   R MCA>>residual spastic L HP; TIA 06/2011    SURGICAL HISTORY: Past Surgical  History:  Procedure Laterality Date  . ABDOMINAL HYSTERECTOMY  1960   Fibroid tumors  . COLONOSCOPY  06/06/2012   Procedure: COLONOSCOPY;  Surgeon: Louis Meckel, MD;  Location: [facility];  Service: Endoscopy;  Laterality: N/A;  Rm 1501. Pt to be discharged after procedure. Patient was only admitted for prep.    SOCIAL HISTORY: Social History   Social History  . Marital status: Single    Spouse name: N/A  . Number of children: 2  . Years of education: N/A   Occupational History  . RETIRED Retired   Social History Main Topics  . Smoking status: Never Smoker  . Smokeless tobacco: Never Used  . Alcohol use No  . Drug use: No  . Sexual activity: No   Other Topics Concern  . Not on file   Social History Narrative   Lives alone in independent living, single. Never married   Supportive daughter [name] -    FAMILY HISTORY: Family History  Problem Relation Age of Onset  . Arthritis Mother   . Arthritis Father     ALLERGIES:  has No Known Allergies.  MEDICATIONS:  Current Outpatient Prescriptions  Medication Sig Dispense Refill  . acetaminophen (TYLENOL) 500 MG tablet Take 500 mg by mouth every 6 (six) hours as needed for mild pain.    Marland Kitchen atorvastatin (LIPITOR) 20 MG tablet Take 1 tablet (20 mg total) by mouth daily.  90 tablet 3  . CARTIA XT 120 MG 24 hr capsule TAKE 1 CAPSULE BY MOUTH  DAILY 90 capsule 1  . cholecalciferol (VITAMIN D) 1000 UNITS tablet Take 1,000 Units by mouth daily.    . citalopram (CELEXA) 20 MG tablet Take 1 tablet (20 mg total) by mouth daily. 90 tablet 3  . denosumab (PROLIA) 60 MG/ML SOLN injection Inject 60 mg into the skin every 6 (six) months. Administer in upper arm, thigh, or abdomen  Usually take on the 23rd of each month    . ferrous sulfate (SLOW FE) 160 (50 Fe) MG TBCR SR tablet Take 1 tablet (160 mg total) by mouth daily. 30 each 5  . hydroxypropyl methylcellulose / hypromellose (ISOPTO TEARS / GONIOVISC) 2.5 % ophthalmic solution  Place 1 drop into both eyes as needed for dry eyes (dry eyes).    . insulin lispro (HUMALOG KWIKPEN) 100 UNIT/ML KiwkPen Inject into the skin 3 (three) times daily. (Per sliding Scale) 151-200 = 2 units, 201-250 = 4 units, 251-300 = 6 units 301-350 = 8 units, 351-400 = 10 units    . Insulin Pen Needle 32G X 4 MM MISC Use to administer insulin four times a day Dx E11.9 30 each 0  . mirtazapine (REMERON) 15 MG tablet TAKE 1 TABLET BY MOUTH AT  BEDTIME 90 tablet 1  . olmesartan (BENICAR) 20 MG tablet     . Omega-3 Fatty Acids (FISH OIL PO) Take 1 capsule by mouth every morning.    . simvastatin (ZOCOR) 20 MG tablet     . SLOW FE 142 (45 Fe) MG TBCR TAKE 1 TABLET (160 MG TOTAL) BY MOUTH DAILY.  5  . triamcinolone (KENALOG) 0.025 % ointment Apply 1 application topically 2 (two) times daily. 30 g 5  . vitamin B-12 (CYANOCOBALAMIN) 100 MCG tablet Take 100 mcg by mouth daily.    Marland Kitchen zolpidem (AMBIEN) 5 MG tablet Take 1 tablet (5 mg total) by mouth at bedtime as needed. For sleep. 30 tablet 3   No current facility-administered medications for this visit.     REVIEW OF SYSTEMS:    10 Point review of Systems was done is negative except as noted above.  PHYSICAL EXAMINATION: ECOG PERFORMANCE STATUS: 3 - Symptomatic, >50% confined to bed  . Vitals:   09/18/16 1447  BP: (!) 184/87  Pulse: 75  Resp: 18  Temp: 98.6 F (37 C)   Filed Weights   09/18/16 1447  Weight: 113 lb 14.4 oz (51.7 kg)   .Body mass index is 20.83 kg/m.  GENERAL:alert, in no acute distress and comfortable SKIN: skin color, texture, turgor are normal, no rashes or significant lesions EYES: normal, conjunctiva are pink and non-injected, sclera clear OROPHARYNX:no exudate, no erythema and lips, buccal mucosa, and tongue normal  NECK: supple, no JVD, thyroid normal size, non-tender, without nodularity LYMPH:  no palpable lymphadenopathy in the cervical, axillary or inguinal LUNGS: clear to auscultation with normal  respiratory effort HEART: regular rate & rhythm,  no murmurs and no lower extremity edema ABDOMEN: abdomen soft, non-tender, normoactive bowel sounds  Musculoskeletal: no cyanosis of digits and no clubbing  PSYCH: alert & oriented x 3 with fluent speech NEURO: no focal motor/sensory deficits  LABORATORY DATA:  I have reviewed the data as listed  . CBC Latest Ref Rng & Units 09/18/2016 07/24/2016 07/17/2016  WBC 3.9 - 10.3 10e3/uL 4.9 4.5 5.2  Hemoglobin 11.6 - 15.9 g/dL 11.3(L) 10.4(L) 7.4(L)  Hematocrit 34.8 - 46.6 % 35.1  32.6(L) 23.4(L)  Platelets 145 - 400 10e3/uL 172 210.0 215   . CBC    Component Value Date/Time   WBC 4.9 09/18/2016 1603   WBC 4.5 07/24/2016 1132   RBC 3.74 09/18/2016 1603   RBC 3.39 (L) 07/24/2016 1132   HGB 11.3 (L) 09/18/2016 1603   HCT 35.1 09/18/2016 1603   PLT 172 09/18/2016 1603   MCV 93.9 09/18/2016 1603   MCH 30.2 09/18/2016 1603   MCH 30.5 07/17/2016 1756   MCHC 32.2 09/18/2016 1603   MCHC 31.9 07/24/2016 1132   RDW 13.3 09/18/2016 1603   LYMPHSABS 1.6 09/18/2016 1603   MONOABS 0.3 09/18/2016 1603   EOSABS 0.1 09/18/2016 1603   BASOSABS 0.0 09/18/2016 1603    . CMP Latest Ref Rng & Units 09/18/2016 09/18/2016 07/24/2016  Glucose 70 - 140 mg/dl 409(W) - 119(J)  BUN 7.0 - 26.0 mg/dL 28.3(H) - 25(H)  Creatinine 0.6 - 1.1 mg/dL 4.7(W) - 2.95(A)  Sodium 136 - 145 mEq/L 137 - 139  Potassium 3.5 - 5.1 mEq/L 4.6 - 4.6  Chloride 96 - 112 mEq/L - - 106  CO2 22 - 29 mEq/L 23 - 22  Calcium 8.4 - 10.4 mg/dL 9.9 - 8.6  Total Protein 6.0 - 8.5 g/dL 7.8 7.5 7.2  Total Bilirubin 0.20 - 1.20 mg/dL 2.13 - 0.3  Alkaline Phos 40 - 150 U/L 81 - 60  AST 5 - 34 U/L 16 - 12  ALT 0 - 55 U/L 12 - 9   . Lab Results  Component Value Date   IRON 99 09/18/2016   TIBC 315 09/18/2016   IRONPCTSAT 31 09/18/2016   (Iron and TIBC)  Lab Results  Component Value Date   FERRITIN 63 09/18/2016     RADIOGRAPHIC STUDIES: I have personally reviewed the  radiological images as listed and agreed with the findings in the report. No results found.  ASSESSMENT & PLAN:   83 yo AAF with multiple medical co-morbids with   1) Acute on chronic Anemia Patient appears to have some baseline anemia with borderline macrocytic indices with nl B12 and folate.  PBS suggestive of some dysplastic changes. Also some element of Anemia of CKD Drop in hgb in 06/2016 appears to have been from significant blood loss from her head laceration resulting from trauma related to her fall.  Patient hgb has improved to 11.3 while on iron replacement. Ferritin levels have improved to 63 from 34 (with PRBC and PO iron) SPEP no M spike Nl SFLC ratio PLAN -no indication for PRBC transfusion at this time -would recommend iron replacement with slow Fe or Iron polysaccharide to maintain ferritin >100 in the setting of CKD and possibly MDS -reasonable to take daily B complex to coverage any other B vitamin responsive anemia. -monitor for GI bleeding or other bleeding on plavix -if ferritin >100 and hgb <9 could consider empiric ESA's vs consideration of BM Bx based on goals of care (to evaluate in more details for MDS) -would hold off on BM BX at this time.  2) . Patient Active Problem List   Diagnosis Date Noted  . Hyperkalemia 07/24/2016  . Fall 07/17/2016  . Laceration of head 07/11/2016  . Poor balance 07/11/2016  . Upper back pain 07/11/2016  . Chronic fatigue 01/16/2016  . Distal radius fracture   . Right wrist fracture 05/14/2015  . Onychomycosis 02/04/2013  . Pain due to onychomycosis of toenail 02/04/2013  . Diabetes type 2, controlled (HCC) 09/20/2011  . Hypertension  09/20/2011  . Hyperlipidemia 09/20/2011  . Senile osteoporosis 09/20/2011  . Depression with anxiety 09/20/2011  . Anemia 09/20/2011  . CVA, old, hemiparesis (HCC)   Plan -continue f/u with PCP for mx of other medical co-morbids  RTC with Dr Candise CheKale in 3 months with rpt labs , earlier if  any new concerns  All of the patients questions were answered with apparent satisfaction. The patient knows to call the clinic with any problems, questions or concerns.  I spent 45 minutes counseling the patient face to face. The total time spent in the appointment was 60 minutes and more than 50% was on counseling and direct patient cares.    Wyvonnia LoraGautam Khalidah Herbold MD MS AAHIVMS [facility] Hematology/Oncology Physician [facility]  (Office):       [phone] (Work cell):  [phone] (Fax):           [phone]  09/18/2016 3:01 PM

## 2016-09-19 LAB — KAPPA/LAMBDA LIGHT CHAINS
Ig Kappa Free Light Chain: 31.3 mg/L — ABNORMAL HIGH (ref 3.3–19.4)
Ig Lambda Free Light Chain: 23.4 mg/L (ref 5.7–26.3)
Kappa/Lambda FluidC Ratio: 1.34 (ref 0.26–1.65)

## 2016-09-19 LAB — ERYTHROPOIETIN: Erythropoietin: 20.7 m[IU]/mL — ABNORMAL HIGH (ref 2.6–18.5)

## 2016-09-19 LAB — IRON AND TIBC
%SAT: 31 % (ref 21–57)
Iron: 99 ug/dL (ref 41–142)
TIBC: 315 ug/dL (ref 236–444)
UIBC: 216 ug/dL (ref 120–384)

## 2016-09-19 LAB — FERRITIN: FERRITIN: 63 ng/mL (ref 9–269)

## 2016-09-20 LAB — MULTIPLE MYELOMA PANEL, SERUM
ALBUMIN SERPL ELPH-MCNC: 4 g/dL (ref 2.9–4.4)
ALPHA2 GLOB SERPL ELPH-MCNC: 0.8 g/dL (ref 0.4–1.0)
Albumin/Glob SerPl: 1.2 (ref 0.7–1.7)
Alpha 1: 0.3 g/dL (ref 0.0–0.4)
B-GLOBULIN SERPL ELPH-MCNC: 1.1 g/dL (ref 0.7–1.3)
Gamma Glob SerPl Elph-Mcnc: 1.3 g/dL (ref 0.4–1.8)
Globulin, Total: 3.5 g/dL (ref 2.2–3.9)
IgA, Qn, Serum: 287 mg/dL (ref 64–422)
IgM, Qn, Serum: 36 mg/dL (ref 26–217)
TOTAL PROTEIN: 7.5 g/dL (ref 6.0–8.5)

## 2016-09-24 ENCOUNTER — Telehealth: Payer: Self-pay | Admitting: *Deleted

## 2016-09-24 NOTE — Telephone Encounter (Signed)
-----   Message from Johney MaineGautam Kishore Kale, MD sent at 09/24/2016  1:58 AM EST ----- Minerva EndsLoren, Plz let patient/her daughter know that her anemia has improved with hgb up to 11.3. Would recommend continued Iron (Iron polysaccharide 150mg  po daily) and B complex 1 tab po daily. Plz reschedule clinic f/u from 2 week to 3months. Thanks GK

## 2016-09-24 NOTE — Telephone Encounter (Signed)
Per Dr. Candise CheKale.  Sw pt's daugther Patricia Andrews to inform that Hgb has improved to 11.3.  Instructed to continue taking iron polysaccharides 150mg  po daily.   Scheduling message sent to move 2 week f/u out to 3 months.  Pt's daughter verbalized understanding.  Daughter stated pt "did not have a good weekend" pt has had headache/stomach ache.  Advised daughter that this is more than likely not due to a hematological issue and if symptoms persist/increase the pt should be evaluated by PCP or ED.  Daughter verbalized understanding/thankful for call.

## 2016-09-25 ENCOUNTER — Telehealth: Payer: Self-pay | Admitting: Internal Medicine

## 2016-09-25 NOTE — Telephone Encounter (Signed)
Daughter following up on patients visit with Dr. Lisette AbuKaylee (hemotologist).  States called office to get results.  States that the office there wanted patient to call Dr. Lawerance BachBurns to get that information?

## 2016-09-26 NOTE — Telephone Encounter (Signed)
Please advise 

## 2016-09-26 NOTE — Telephone Encounter (Signed)
Spoke with pts daughter, appt has been scheduled.

## 2016-09-26 NOTE — Telephone Encounter (Signed)
I reviewed her chart -- looks like her daughter spoke with someone regarding the results and was given instructions (see phone note if needed).  She mentioned during that phone call that her mother, Patricia Andrews, was having headaches and stomach ache - they then advised her to follow up with us if symptoms did not go away.  If she is still having symptoms she should be seen.

## 2016-10-01 ENCOUNTER — Ambulatory Visit (INDEPENDENT_AMBULATORY_CARE_PROVIDER_SITE_OTHER): Payer: Medicare Other | Admitting: Internal Medicine

## 2016-10-01 VITALS — BP 180/82 | HR 80 | Temp 98.2°F | Resp 16 | Wt 114.0 lb

## 2016-10-01 DIAGNOSIS — D649 Anemia, unspecified: Secondary | ICD-10-CM

## 2016-10-01 DIAGNOSIS — R413 Other amnesia: Secondary | ICD-10-CM | POA: Diagnosis not present

## 2016-10-01 DIAGNOSIS — I10 Essential (primary) hypertension: Secondary | ICD-10-CM

## 2016-10-01 DIAGNOSIS — E119 Type 2 diabetes mellitus without complications: Secondary | ICD-10-CM

## 2016-10-01 MED ORDER — INSULIN GLARGINE 100 UNIT/ML SOLOSTAR PEN
10.0000 [IU] | PEN_INJECTOR | Freq: Every day | SUBCUTANEOUS | 5 refills | Status: DC
Start: 2016-10-01 — End: 2016-10-08

## 2016-10-01 NOTE — Patient Instructions (Addendum)
   Medications reviewed and updated.  Changes include starting lantus 10 units daily. Continue sliding scale insulin.    A referral was ordered for neurology  Please followup in 3 months

## 2016-10-01 NOTE — Progress Notes (Signed)
Subjective:    Patient ID: Patricia Andrews, female    DOB: October 05, 1925, 83 y.o.   MRN: [id number]  HPI She is here for an acute visit.     Unsteady:  She does not exercise.  She uses her cane.  She has fallen several times.  She has done PT.  She does not do any exercises at home.    Iron def anemia:  She is taking her iron daily.  She denies constipation as a side effect.  She still feels fatigued, but is very sedentary.  She has seen hematology and has a follow up in 3 months.  Memory concerns:  Her daughter feels that since she has fallen and hit her head (07/08/16) her cognition, memory and gait have been worse.  She eats well and sleeps well.  Her short term memory is bad.  She had difficulty staying connected, but she wonders if her hearing a contributing to that. She used to sundown in the evening, but now she does it throughout the day.  She has seen a steady decline in her mother.   Diabetes:  Her sugars have been in the 200-300's.  She eats three times a day.  She is not active.  Her daughter has been checking her sugars 4 times a day and recently her test strips have not been working and they have used more.  She needs a refill.  She  She is taking the sliding scale insulin as prescribed.   Hypertension: She is taking her medication daily. She is compliant with a low sodium diet.  She denies chest pain, palpitations, shortness of breath and lightheadedness. She is not exercising regularly.  She does not monitor her blood pressure at home.     Medications and allergies reviewed with patient and updated if appropriate.  Patient Active Problem List   Diagnosis Date Noted  . Hyperkalemia 07/24/2016  . Fall 07/17/2016  . Laceration of head 07/11/2016  . Poor balance 07/11/2016  . Upper back pain 07/11/2016  . Chronic fatigue 01/16/2016  . Distal radius fracture   . Right wrist fracture 05/14/2015  . Onychomycosis 02/04/2013  . Pain due to onychomycosis of toenail 02/04/2013   . Diabetes type 2, controlled (HCC) 09/20/2011  . Hypertension 09/20/2011  . Hyperlipidemia 09/20/2011  . Senile osteoporosis 09/20/2011  . Depression with anxiety 09/20/2011  . Anemia 09/20/2011  . CVA, old, hemiparesis (HCC)     Current Outpatient Prescriptions on File Prior to Visit  Medication Sig Dispense Refill  . acetaminophen (TYLENOL) 500 MG tablet Take 500 mg by mouth every 6 (six) hours as needed for mild pain.    Marland Kitchen. atorvastatin (LIPITOR) 20 MG tablet Take 1 tablet (20 mg total) by mouth daily. 90 tablet 3  . CARTIA XT 120 MG 24 hr capsule TAKE 1 CAPSULE BY MOUTH  DAILY 90 capsule 1  . cholecalciferol (VITAMIN D) 1000 UNITS tablet Take 1,000 Units by mouth daily.    . citalopram (CELEXA) 20 MG tablet Take 1 tablet (20 mg total) by mouth daily. 90 tablet 3  . denosumab (PROLIA) 60 MG/ML SOLN injection Inject 60 mg into the skin every 6 (six) months. Administer in upper arm, thigh, or abdomen  Usually take on the 23rd of each month    . ferrous sulfate (SLOW FE) 160 (50 Fe) MG TBCR SR tablet Take 1 tablet (160 mg total) by mouth daily. 30 each 5  . hydroxypropyl methylcellulose / hypromellose (ISOPTO TEARS / GONIOVISC)  2.5 % ophthalmic solution Place 1 drop into both eyes as needed for dry eyes (dry eyes).    . insulin lispro (HUMALOG KWIKPEN) 100 UNIT/ML KiwkPen Inject into the skin 3 (three) times daily. (Per sliding Scale) 151-200 = 2 units, 201-250 = 4 units, 251-300 = 6 units 301-350 = 8 units, 351-400 = 10 units    . Insulin Pen Needle 32G X 4 MM MISC Use to administer insulin four times a day Dx E11.9 30 each 0  . mirtazapine (REMERON) 15 MG tablet TAKE 1 TABLET BY MOUTH AT  BEDTIME 90 tablet 1  . olmesartan (BENICAR) 20 MG tablet     . Omega-3 Fatty Acids (FISH OIL PO) Take 1 capsule by mouth every morning.    . simvastatin (ZOCOR) 20 MG tablet     . SLOW FE 142 (45 Fe) MG TBCR TAKE 1 TABLET (160 MG TOTAL) BY MOUTH DAILY.  5  . vitamin B-12 (CYANOCOBALAMIN) 100 MCG  tablet Take 100 mcg by mouth daily.    Marland Kitchen zolpidem (AMBIEN) 5 MG tablet Take 1 tablet (5 mg total) by mouth at bedtime as needed. For sleep. 30 tablet 3   No current facility-administered medications on file prior to visit.     Past Medical History:  Diagnosis Date  . Allergic rhinitis   . Anemia   . Arthritis   . Depression    anxiety overlap  . Diabetes mellitus, type 2 (HCC)   . Hyperlipidemia   . Hypertension   . Osteoporosis, senile    accidental fall w/ pelvic fx 2006, L wrist fx 10/2013  . Pelvis fracture (HCC)   . Stroke [facility]) 1979   R MCA>>residual spastic L HP; TIA 06/2011    Past Surgical History:  Procedure Laterality Date  . ABDOMINAL HYSTERECTOMY  1960   Fibroid tumors  . COLONOSCOPY  06/06/2012   Procedure: COLONOSCOPY;  Surgeon: Louis Meckel, MD;  Location: [facility];  Service: Endoscopy;  Laterality: N/A;  Rm 1501. Pt to be discharged after procedure. Patient was only admitted for prep.    Social History   Social History  . Marital status: Single    Spouse name: N/A  . Number of children: 2  . Years of education: N/A   Occupational History  . RETIRED Retired   Social History Main Topics  . Smoking status: Never Smoker  . Smokeless tobacco: Never Used  . Alcohol use No  . Drug use: No  . Sexual activity: No   Other Topics Concern  . Not on file   Social History Narrative   Lives alone in independent living, single. Never married   Supportive daughter [name] -    Family History  Problem Relation Age of Onset  . Arthritis Mother   . Arthritis Father     Review of Systems  Constitutional: Positive for fatigue. Negative for appetite change.  Respiratory: Negative for cough, shortness of breath and wheezing.   Cardiovascular: Positive for leg swelling (intermittent). Negative for chest pain and palpitations.  Neurological: Positive for headaches (mild, occasional; occasionally feels tremor in head). Negative for light-headedness.    Psychiatric/Behavioral: Negative for sleep disturbance.       Objective:   Vitals:   10/01/16 1512  BP: (!) 180/82  Pulse: 80  Resp: 16  Temp: 98.2 F (36.8 C)   Filed Weights   10/01/16 1512  Weight: 114 lb (51.7 kg)   Body mass index is 20.85 kg/m.  Wt Readings from Last 3  Encounters:  10/01/16 114 lb (51.7 kg)  09/18/16 113 lb 14.4 oz (51.7 kg)  07/24/16 116 lb 4 oz (52.7 kg)     Physical Exam Constitutional: Appears well-developed and well-nourished. No distress.  HENT:  Head: Normocephalic and atraumatic.  Neck: Neck supple. No tracheal deviation present. No thyromegaly present.  No cervical lymphadenopathy Cardiovascular: Normal rate, regular rhythm and normal heart sounds.   2/6 systolic murmur heard. No carotid bruit .  No edema Pulmonary/Chest: Effort normal and breath sounds normal. No respiratory distress. No has no wheezes. No rales.  Skin: Skin is warm and dry. Not diaphoretic.  Psychiatric: Normal mood and affect. Behavior is normal.         Assessment & Plan:   See Problem List for Assessment and Plan of chronic medical problems.

## 2016-10-01 NOTE — Progress Notes (Signed)
Pre visit review using our clinic review tool, if applicable. No additional management support is needed unless otherwise documented below in the visit note. 

## 2016-10-02 ENCOUNTER — Encounter: Payer: Self-pay | Admitting: Internal Medicine

## 2016-10-02 ENCOUNTER — Ambulatory Visit: Payer: Self-pay | Admitting: Hematology

## 2016-10-02 DIAGNOSIS — R413 Other amnesia: Secondary | ICD-10-CM | POA: Insufficient documentation

## 2016-10-02 NOTE — Assessment & Plan Note (Signed)
Severe fall with head laceration 06/2016 has made her memory worse No behavioral concerns Will refer to neuro for further evaluation

## 2016-10-02 NOTE — Assessment & Plan Note (Signed)
BP Readings from Last 3 Encounters:  10/01/16 (!) 180/82  09/18/16 (!) 184/87  07/24/16 130/66   BP elevated the last two visits, but prior to that it has been controlled Advised monitoring at home - if better controlled I do not want to increase medication and increase her risk of falls

## 2016-10-02 NOTE — Assessment & Plan Note (Signed)
Has seen hem Tolerating slow Fe and anemia improving Possible related to CKD and iron def - less likely MDS Has hem f/u in 3 months Continue iron

## 2016-10-02 NOTE — Assessment & Plan Note (Signed)
Not controlled Start lantus 10 units daily Continue sliding scale  Will titrate lantus as needed

## 2016-10-04 ENCOUNTER — Ambulatory Visit (INDEPENDENT_AMBULATORY_CARE_PROVIDER_SITE_OTHER): Payer: Medicare Other | Admitting: Podiatry

## 2016-10-04 ENCOUNTER — Encounter: Payer: Self-pay | Admitting: Podiatry

## 2016-10-04 DIAGNOSIS — B351 Tinea unguium: Secondary | ICD-10-CM

## 2016-10-04 DIAGNOSIS — M79671 Pain in right foot: Secondary | ICD-10-CM

## 2016-10-04 DIAGNOSIS — M79672 Pain in left foot: Secondary | ICD-10-CM | POA: Diagnosis not present

## 2016-10-04 NOTE — Patient Instructions (Signed)
Seen for hypertrophic nails and calluses. All nails and calluses debrided. Return in 3 months or as needed.  

## 2016-10-04 NOTE — Progress Notes (Signed)
Subjective:  83 year old female presents requesting toe nails and calluses trimmed.   History of multiple falls since she was in last year and wears wrist brace on right.  Wears AFO on left foot and leg, and ambulating well using a cane.  Now she is living with her daughter.  Objective: Hypertrophic nails x 10.  Hyperpigmented right bunion without erythema or edema. Plantar callus on left great toe and 2nd MPJ right.  Left foot pedal pulses are not palpable, right side is normal with palpable pedal pulses.   Assessment: Dystrophic nails x 10.  Painful feet with corns, calluses, and toe nails.  Status post stroke with paralysis on left side arm and leg.   Plan: All nails, corns and calluses debrided.  Return in 3 months or as needed. 

## 2016-10-08 ENCOUNTER — Encounter: Payer: Self-pay | Admitting: Psychology

## 2016-10-08 ENCOUNTER — Telehealth: Payer: Self-pay | Admitting: Internal Medicine

## 2016-10-08 MED ORDER — INSULIN GLARGINE 100 UNIT/ML SOLOSTAR PEN: 10.0000 [IU] | PEN_INJECTOR | Freq: Every day | SUBCUTANEOUS | 1 refills | Status: DC

## 2016-10-08 NOTE — Telephone Encounter (Signed)
Pt daughter called in and said that she would needs pt insulin sent to [facility] the mail order pharmacy.  It is too much at the other pharmacy

## 2016-10-29 ENCOUNTER — Ambulatory Visit (INDEPENDENT_AMBULATORY_CARE_PROVIDER_SITE_OTHER): Payer: Medicare Other | Admitting: Psychology

## 2016-10-29 DIAGNOSIS — R413 Other amnesia: Secondary | ICD-10-CM | POA: Diagnosis not present

## 2016-10-29 DIAGNOSIS — S060X0S Concussion without loss of consciousness, sequela: Secondary | ICD-10-CM
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
## 2016-10-30 ENCOUNTER — Encounter: Payer: Self-pay | Admitting: Psychology

## 2016-11-06 ENCOUNTER — Ambulatory Visit (INDEPENDENT_AMBULATORY_CARE_PROVIDER_SITE_OTHER): Payer: Medicare Other | Admitting: Psychology

## 2016-11-06 DIAGNOSIS — R413 Other amnesia: Secondary | ICD-10-CM

## 2016-11-06 DIAGNOSIS — S060X0S Concussion without loss of consciousness, sequela: Secondary | ICD-10-CM

## 2016-11-06 NOTE — Progress Notes (Signed)
   Neuropsychology Note  Patricia Andrews returned today for 2 hours of neuropsychological testing with technician, Wallace Kellerana Chamberlain, BS, under the supervision of Dr. Elvis CoilMaryBeth Bailar. The patient did not appear overtly distressed by the testing session, per behavioral observation or via self-report to the technician. Rest breaks were offered. Patricia Andrews will return within 2 weeks for a feedback session with Dr. Alinda DoomsBailar at which time her test performances, clinical impressions and treatment recommendations will be reviewed in detail. The patient understands she can contact our office should she require our assistance before this time.  Full report to follow.

## 2016-11-07 ENCOUNTER — Other Ambulatory Visit: Payer: Self-pay | Admitting: Internal Medicine
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
## 2016-11-19 ENCOUNTER — Encounter: Payer: Self-pay | Admitting: Psychology

## 2016-11-20 ENCOUNTER — Encounter: Payer: Self-pay | Admitting: Psychology

## 2016-11-20 ENCOUNTER — Ambulatory Visit (INDEPENDENT_AMBULATORY_CARE_PROVIDER_SITE_OTHER): Payer: Medicare Other | Admitting: Psychology

## 2016-11-20 DIAGNOSIS — S060X0S Concussion without loss of consciousness, sequela: Secondary | ICD-10-CM

## 2016-11-20 DIAGNOSIS — F0150 Vascular dementia without behavioral disturbance: Secondary | ICD-10-CM | POA: Diagnosis not present

## 2016-11-20 NOTE — Patient Instructions (Signed)
Clinical Impressions: Dementia -- likely underlying vascular dementia exacerbated by head injury. Results of the current evaluation revealed significant cognitive impairment. Additionally, there is evidence that her cognitive deficits are interfering with her ability to manage complex ADLs such as medications and finances. As such, diagnostic criteria for a dementia sydrome are met. From the history provided, it appears that the patient was already experiencing mild dementia prior to her fall and head injury in 06/2016. The fall and associated concussion could have certainly exacerbated underlying dementia. At the present time, her testing performances indicate particularly deficient psychomotor processing speed, confrontation naming, mental flexibility, and encoding/retrieval of new information (with relatively intact recognition). Fortunately, she is not reporting significant depression or anxiety.  Based on her remote history of significant stroke at age 32 (21 years ago) and neuroimaging showing moderate chronic ischemic microvascular changes, alongside the pattern of strengths and weaknesses on cognitive testing, I suspect the etiology of her dementia is vascular. While I cannot rule out a developing, superimposed Alzheimer's disease, her testing results were not entirely consistent with this.     Recommendations/Plan: Based on the findings of the present evaluation, the following recommendations are offered:  1. Continue to live in supported/supervised environment where complex ADLs are managed for her. She will do best with structure and routine.  2. Caregiver education and support (dementia caregiver support group) 3. If there is worsening observed, re-evaluation could be helpful in assessing interval decline and differential diagnosis.

## 2016-11-28 ENCOUNTER — Other Ambulatory Visit: Payer: Self-pay | Admitting: *Deleted

## 2016-11-28 MED ORDER — GLUCOSE BLOOD VI STRP: 1.0000 | ORAL_STRIP | Freq: Two times a day (BID) | 1 refills | Status: DC

## 2016-11-28 MED ORDER — ONETOUCH VERIO W/DEVICE KIT: 1.0000 | PACK | 0 refills | Status: DC

## 2016-11-28 MED ORDER — ONETOUCH DELICA LANCETS 33G MISC: 1 refills | Status: DC

## 2016-12-30 DIAGNOSIS — F0390 Unspecified dementia without behavioral disturbance: Secondary | ICD-10-CM | POA: Insufficient documentation

## 2016-12-30 NOTE — Patient Instructions (Addendum)
Test(s) ordered today. Your results will be released to MyChart (or called to you) after review, usually within 72hours after test completion. If any changes need to be made, you will be notified at that same time.   Medications reviewed and updated.  Changes include starting aricept at bedtime.  We will start amlodipine 2.5 mg daily for your blood pressure.    Your prescription(s) have been submitted to your pharmacy. Please take as directed and contact our office if you believe you are having problem(s) with the medication(s).  A referral was ordered for audiology.   Please followup in 3 months.      Donepezil tablets What is this medicine? DONEPEZIL (doe NEP e zil) is used to treat mild to moderate dementia caused by Alzheimer's disease. This medicine may be used for other purposes; ask your health care provider or pharmacist if you have questions. COMMON BRAND NAME(S): Aricept What should I tell my health care provider before I take this medicine? They need to know if you have any of these conditions: -asthma or other lung disease -difficulty passing urine -head injury -heart disease -history of irregular heartbeat -liver disease -seizures (convulsions) -stomach or intestinal disease, ulcers or stomach bleeding -an unusual or allergic reaction to donepezil, other medicines, foods, dyes, or preservatives -pregnant or trying to get pregnant -breast-feeding How should I use this medicine? Take this medicine by mouth with a glass of water. Follow the directions on the prescription label. You may take this medicine with or without food. Take this medicine at regular intervals. This medicine is usually taken before bedtime. Do not take it more often than directed. Continue to take your medicine even if you feel better. Do not stop taking except on your doctor's advice. If you are taking the 23 mg donepezil tablet, swallow it whole; do not cut, crush, or chew it. Talk to your  pediatrician regarding the use of this medicine in children. Special care may be needed. Overdosage: If you think you have taken too much of this medicine contact a poison control center or emergency room at once. NOTE: This medicine is only for you. Do not share this medicine with others. What if I miss a dose? If you miss a dose, take it as soon as you can. If it is almost time for your next dose, take only that dose, do not take double or extra doses. What may interact with this medicine? Do not take this medicine with any of the following medications: -certain medicines for fungal infections like itraconazole, fluconazole, posaconazole, and voriconazole -cisapride -dextromethorphan; quinidine -dofetilide -dronedarone -pimozide -quinidine -thioridazine -ziprasidone This medicine may also interact with the following medications: -antihistamines for allergy, cough and cold -atropine -bethanechol -carbamazepine -certain medicines for bladder problems like oxybutynin, tolterodine -certain medicines for Parkinson's disease like benztropine, trihexyphenidyl -certain medicines for stomach problems like dicyclomine, hyoscyamine -certain medicines for travel sickness like scopolamine -dexamethasone -ipratropium -NSAIDs, medicines for pain and inflammation, like ibuprofen or naproxen -other medicines for Alzheimer's disease -other medicines that prolong the QT interval (cause an abnormal heart rhythm) -phenobarbital -phenytoin -rifampin, rifabutin or rifapentine This list may not describe all possible interactions. Give your health care provider a list of all the medicines, herbs, non-prescription drugs, or dietary supplements you use. Also tell them if you smoke, drink alcohol, or use illegal drugs. Some items may interact with your medicine. What should I watch for while using this medicine? Visit your doctor or health care professional for regular checks on your progress.  Check with  your doctor or health care professional if your symptoms do not get better or if they get worse. You may get drowsy or dizzy. Do not drive, use machinery, or do anything that needs mental alertness until you know how this drug affects you. What side effects may I notice from receiving this medicine? Side effects that you should report to your doctor or health care professional as soon as possible: -allergic reactions like skin rash, itching or hives, swelling of the face, lips, or tongue -feeling faint or lightheaded, falls -loss of bladder control -seizures -signs and symptoms of a dangerous change in heartbeat or heart rhythm like chest pain; dizziness; fast or irregular heartbeat; palpitations; feeling faint or lightheaded, falls; breathing problems -signs and symptoms of infection like fever or chills; cough; sore throat; pain or trouble passing urine -signs and symptoms of liver injury like dark yellow or brown urine; general ill feeling or flu-like symptoms; light-colored stools; loss of appetite; nausea; right upper belly pain; unusually weak or tired; yellowing of the eyes or skin -slow heartbeat or palpitations -unusual bleeding or bruising -vomiting Side effects that usually do not require medical attention (report to your doctor or health care professional if they continue or are bothersome): -diarrhea, especially when starting treatment -headache -loss of appetite -muscle cramps -nausea -stomach upset This list may not describe all possible side effects. Call your doctor for medical advice about side effects. You may report side effects to FDA at 1-800-FDA-1088. Where should I keep my medicine? Keep out of reach of children. Store at room temperature between 15 and 30 degrees C (59 and 86 degrees F). Throw away any unused medicine after the expiration date. NOTE: This sheet is a summary. It may not cover all possible information. If you have questions about this medicine, talk to  your doctor, pharmacist, or health care provider.  2018 Elsevier/Gold Standard (2016-01-30 21:00:42)

## 2016-12-30 NOTE — Progress Notes (Signed)
Subjective:    Patient ID: Patricia Andrews, female    DOB: [birth year], 83 y.o.   MRN: [id number]  HPI The patient is here for follow up.  She was evaluated by neuropsychology and was found to have probably dementia, likely underlying vascular dementia.  She was thought to be a good candidate for medication.  She is here for follow up today.    Diabetes: She is taking her medication daily as prescribed - 10 units once daily.  She only occasionally needs sliding coverage -  When she does not eat right. She is compliant with a diabetic diet. She is not exercising regularly.   Hypertension: She is taking her medication daily. She is compliant with a low sodium diet.  She denies chest pain, palpitations, edema and regular headaches. She is not exercising regularly.  She does monitor her blood pressure at home - on occasion and it has been elevated.    Dyspnea on exertion:  She is very inactive.  She gets SOB with walking on a flat surface a few steps.  She denies any chest pain or lightheadedness.  She denies palpitations.   Medications and allergies reviewed with patient and updated if appropriate.  Patient Active Problem List   Diagnosis Date Noted  . Dementia 12/30/2016  . Hyperkalemia 07/24/2016  . Fall 07/17/2016  . Laceration of head 07/11/2016  . Poor balance 07/11/2016  . Upper back pain 07/11/2016  . Chronic fatigue 01/16/2016  . Distal radius fracture   . Right wrist fracture 05/14/2015  . Onychomycosis 02/04/2013  . Pain due to onychomycosis of toenail 02/04/2013  . Diabetes type 2, controlled ([place]) 09/20/2011  . Hypertension 09/20/2011  . Hyperlipidemia 09/20/2011  . Senile osteoporosis 09/20/2011  . Depression with anxiety 09/20/2011  . Anemia 09/20/2011  . CVA, old, hemiparesis ([place])     Current Outpatient Prescriptions on File Prior to Visit  Medication Sig Dispense Refill  . acetaminophen (TYLENOL) 500 MG tablet Take 500 mg by mouth every 6 (six) hours as  needed for mild pain.    Marland Kitchen atorvastatin (LIPITOR) 20 MG tablet Take 1 tablet (20 mg total) by mouth daily. 90 tablet 3  . Blood Glucose Monitoring Suppl (ONETOUCH VERIO) w/Device KIT 1 Device by Does not apply route every morning. Use to check blood sugars twice a day 1 kit 0  . CARTIA XT 120 MG 24 hr capsule TAKE 1 CAPSULE BY MOUTH  DAILY 90 capsule 1  . cholecalciferol (VITAMIN D) 1000 UNITS tablet Take 1,000 Units by mouth daily.    . citalopram (CELEXA) 20 MG tablet Take 1 tablet (20 mg total) by mouth daily. 90 tablet 3  . denosumab (PROLIA) 60 MG/ML SOLN injection Inject 60 mg into the skin every 6 (six) months. Administer in upper arm, thigh, or abdomen  Usually take on the 23rd of each month    . glucose blood (ONETOUCH VERIO) test strip 1 each by Other route 2 (two) times daily. Use to check blood sugars twice a day 300 each 1  . hydroxypropyl methylcellulose / hypromellose (ISOPTO TEARS / GONIOVISC) 2.5 % ophthalmic solution Place 1 drop into both eyes as needed for dry eyes (dry eyes).    . Insulin Glargine (LANTUS SOLOSTAR) 100 UNIT/ML Solostar Pen Inject 10 Units into the skin daily. 3 pen 1  . insulin lispro (HUMALOG KWIKPEN) 100 UNIT/ML KiwkPen Inject into the skin 3 (three) times daily. (Per sliding Scale) 151-200 = 2 units, 201-250 = 4 units,  251-300 = 6 units 301-350 = 8 units, 351-400 = 10 units    . Insulin Pen Needle 32G X 4 MM MISC Use to administer insulin four times a day Dx E11.9 30 each 0  . mirtazapine (REMERON) 15 MG tablet TAKE 1 TABLET BY MOUTH AT  BEDTIME 90 tablet 1  . olmesartan (BENICAR) 20 MG tablet     . Omega-3 Fatty Acids (FISH OIL PO) Take 1 capsule by mouth every morning.    Glory Rosebush DELICA LANCETS 58K MISC Use to help check blood sugars twice a day 300 each 1  . simvastatin (ZOCOR) 20 MG tablet     . SLOW FE 142 (45 Fe) MG TBCR TAKE 1 TABLET (160 MG TOTAL) BY MOUTH DAILY.  5  . vitamin B-12 (CYANOCOBALAMIN) 100 MCG tablet Take 100 mcg by mouth daily.      Marland Kitchen zolpidem (AMBIEN) 5 MG tablet Take 1 tablet (5 mg total) by mouth at bedtime as needed. For sleep. 30 tablet 3   No current facility-administered medications on file prior to visit.     Past Medical History:  Diagnosis Date  . Allergic rhinitis   . Anemia   . Arthritis   . Depression    anxiety overlap  . Diabetes mellitus, type 2 ([place])   . Hyperlipidemia   . Hypertension   . Osteoporosis, senile    accidental fall w/ pelvic fx 2006, L wrist fx 10/2013  . Pelvis fracture ([place])   . Stroke [facility]) 1979   R MCA>>residual spastic L HP; TIA 06/2011    Past Surgical History:  Procedure Laterality Date  . ABDOMINAL HYSTERECTOMY  1960   Fibroid tumors  . COLONOSCOPY  06/06/2012   Procedure: COLONOSCOPY;  Surgeon: Inda Castle, MD;  Location: [facility];  Service: Endoscopy;  Laterality: N/A;  Rm 1501. Pt to be discharged after procedure. Patient was only admitted for prep.    Social History   Social History  . Marital status: Single    Spouse name: N/A  . Number of children: 2  . Years of education: N/A   Occupational History  . RETIRED Retired   Social History Main Topics  . Smoking status: Never Smoker  . Smokeless tobacco: Never Used  . Alcohol use No  . Drug use: No  . Sexual activity: No   Other Topics Concern  . Not on file   Social History Narrative   Lives alone in independent living, single. Never married   Supportive daughter [name] -    Family History  Problem Relation Age of Onset  . Arthritis Mother   . Arthritis Father     Review of Systems     Objective:   Vitals:   12/31/16 1059  BP: (!) 150/68  Pulse: 60  Resp: 16  Temp: 97.8 F (36.6 C)   Wt Readings from Last 3 Encounters:  12/31/16 119 lb (54 kg)  10/01/16 114 lb (51.7 kg)  09/18/16 113 lb 14.4 oz (51.7 kg)   Body mass index is 21.77 kg/m.   Physical Exam    Constitutional: Appears well-developed and well-nourished. No distress.  HENT:  Head: Normocephalic and  atraumatic.  Neck: Neck supple. No tracheal deviation present. No thyromegaly present.  No cervical lymphadenopathy Cardiovascular: Normal rate, regular rhythm and normal heart sounds.   No murmur heard. No carotid bruit .  No edema Pulmonary/Chest: Effort normal and breath sounds normal. No respiratory distress. No has no wheezes. No rales.  Skin: Skin  is warm and dry. Not diaphoretic.  Psychiatric: Normal mood and affect. Behavior is normal.      Assessment & Plan:    See Problem List for Assessment and Plan of chronic medical problems.

## 2016-12-31 ENCOUNTER — Other Ambulatory Visit (INDEPENDENT_AMBULATORY_CARE_PROVIDER_SITE_OTHER): Payer: Medicare Other

## 2016-12-31 ENCOUNTER — Ambulatory Visit (INDEPENDENT_AMBULATORY_CARE_PROVIDER_SITE_OTHER): Payer: Medicare Other | Admitting: Internal Medicine

## 2016-12-31 ENCOUNTER — Encounter: Payer: Self-pay | Admitting: Internal Medicine

## 2016-12-31 VITALS — BP 150/68 | HR 60 | Temp 97.8°F | Resp 16 | Ht 62.0 in | Wt 119.0 lb

## 2016-12-31 DIAGNOSIS — H9190 Unspecified hearing loss, unspecified ear: Secondary | ICD-10-CM

## 2016-12-31 DIAGNOSIS — R0609 Other forms of dyspnea: Secondary | ICD-10-CM

## 2016-12-31 DIAGNOSIS — I10 Essential (primary) hypertension: Secondary | ICD-10-CM | POA: Diagnosis not present

## 2016-12-31 DIAGNOSIS — E7800 Pure hypercholesterolemia, unspecified: Secondary | ICD-10-CM

## 2016-12-31 DIAGNOSIS — E119 Type 2 diabetes mellitus without complications: Secondary | ICD-10-CM

## 2016-12-31 DIAGNOSIS — F0150 Vascular dementia without behavioral disturbance: Secondary | ICD-10-CM | POA: Diagnosis not present

## 2016-12-31 LAB — CBC WITH DIFFERENTIAL/PLATELET
BASOS PCT: 0.6 % (ref 0.0–3.0)
Basophils Absolute: 0 10*3/uL (ref 0.0–0.1)
EOS ABS: 0.1 10*3/uL (ref 0.0–0.7)
Eosinophils Relative: 2.2 % (ref 0.0–5.0)
HEMATOCRIT: 33.9 % — AB (ref 36.0–46.0)
Hemoglobin: 10.8 g/dL — ABNORMAL LOW (ref 12.0–15.0)
LYMPHS ABS: 1.7 10*3/uL (ref 0.7–4.0)
LYMPHS PCT: 40.1 % (ref 12.0–46.0)
MCHC: 31.9 g/dL (ref 30.0–36.0)
MCV: 97.2 fl (ref 78.0–100.0)
MONOS PCT: 9.3 % (ref 3.0–12.0)
Monocytes Absolute: 0.4 10*3/uL (ref 0.1–1.0)
NEUTROS ABS: 2.1 10*3/uL (ref 1.4–7.7)
Neutrophils Relative %: 47.8 % (ref 43.0–77.0)
Platelets: 184 10*3/uL (ref 150.0–400.0)
RBC: 3.49 Mil/uL — ABNORMAL LOW (ref 3.87–5.11)
RDW: 13.8 % (ref 11.5–15.5)
WBC: 4.4 10*3/uL (ref 4.0–10.5)

## 2016-12-31 LAB — COMPREHENSIVE METABOLIC PANEL
ALT: 14 U/L (ref 0–35)
AST: 17 U/L (ref 0–37)
Albumin: 4.5 g/dL (ref 3.5–5.2)
Alkaline Phosphatase: 55 U/L (ref 39–117)
BILIRUBIN TOTAL: 0.4 mg/dL (ref 0.2–1.2)
BUN: 36 mg/dL — ABNORMAL HIGH (ref 6–23)
CALCIUM: 9.9 mg/dL (ref 8.4–10.5)
CO2: 26 meq/L (ref 19–32)
Chloride: 103 mEq/L (ref 96–112)
Creatinine, Ser: 1.58 mg/dL — ABNORMAL HIGH (ref 0.40–1.20)
GFR: 39.61 mL/min — ABNORMAL LOW (ref >60.00)
GLUCOSE: 200 mg/dL — AB (ref 70–99)
POTASSIUM: 5 meq/L (ref 3.5–5.1)
Sodium: 135 mEq/L (ref 135–145)
Total Protein: 7.6 g/dL (ref 6.0–8.3)

## 2016-12-31 LAB — LIPID PANEL
CHOLESTEROL: 174 mg/dL (ref 0–200)
HDL: 103.1 mg/dL (ref >39.00)
LDL CALC: 60 mg/dL (ref 0–99)
NonHDL: 71.06
TRIGLYCERIDES: 55 mg/dL (ref 0.0–149.0)
Total CHOL/HDL Ratio: 2
VLDL: 11 mg/dL (ref 0.0–40.0)

## 2016-12-31 LAB — HEMOGLOBIN A1C: Hgb A1c MFr Bld: 7.3 % — ABNORMAL HIGH (ref 4.6–6.5)

## 2016-12-31 MED ORDER — DONEPEZIL HCL 5 MG PO TABS: 5.0000 mg | ORAL_TABLET | Freq: Every day | ORAL | 1 refills | Status: DC

## 2016-12-31 MED ORDER — AMLODIPINE BESYLATE 2.5 MG PO TABS: 2.5000 mg | ORAL_TABLET | Freq: Every day | ORAL | 3 refills | Status: DC

## 2016-12-31 NOTE — Assessment & Plan Note (Signed)
BP elevated here and at home Add amlodipine 2.5 mg daily - will titrate if needed Continue other medications

## 2016-12-31 NOTE — Assessment & Plan Note (Signed)
Will refer to cardiology for further evaluation.

## 2016-12-31 NOTE — Progress Notes (Signed)
Pre visit review using our clinic review tool, if applicable. No additional management support is needed unless otherwise documented below in the visit note. 

## 2016-12-31 NOTE — Assessment & Plan Note (Signed)
Check a1c Low sugar / carb diet Stressed regular exercise - encouraged increased activity

## 2016-12-31 NOTE — Assessment & Plan Note (Signed)
Check lipid panel  Continue daily statin  healthy diet encouraged  

## 2017-01-01 ENCOUNTER — Encounter: Payer: Self-pay | Admitting: Hematology

## 2017-01-01 ENCOUNTER — Ambulatory Visit (HOSPITAL_BASED_OUTPATIENT_CLINIC_OR_DEPARTMENT_OTHER): Payer: Medicare Other | Admitting: Hematology

## 2017-01-01 VITALS — BP 184/77 | HR 72 | Temp 98.5°F | Resp 18 | Ht 62.0 in | Wt 119.8 lb

## 2017-01-01 DIAGNOSIS — D649 Anemia, unspecified: Secondary | ICD-10-CM

## 2017-01-01 NOTE — Patient Instructions (Signed)
Thank you for choosing [facility] to provide your oncology and hematology care.  To afford each patient quality time with our providers, please arrive 30 minutes before your scheduled appointment time.  If you arrive late for your appointment, you may be asked to reschedule.  We strive to give you quality time with our providers, and arriving late affects you and other patients whose appointments are after yours.  If you are a no show for multiple scheduled visits, you may be dismissed from the clinic at the providers discretion.   Again, thank you for choosing [facility], our hope is that these requests will decrease the amount of time that you wait before being seen by our physicians.  ______________________________________________________________________ Should you have questions after your visit to the [facility], please contact our office at [phone] between the hours of 8:30 and 4:30 p.m.    Voicemails left after 4:30p.m will not be returned until the following business day.   For prescription refill requests, please have your pharmacy contact us directly.  Please also try to allow 48 hours for prescription requests.   Please contact the scheduling department for questions regarding scheduling.  For scheduling of procedures such as PET scans, CT scans, MRI, Ultrasound, etc please contact central scheduling at [phone].   Resources For Cancer Patients and Caregivers:  American Cancer Society:  800-227-2345  Can help patients locate various types of support and financial assistance Cancer Care: 1-800-813-HOPE (4673) Provides financial assistance, online support groups, medication/co-pay assistance.   [place] DSS:  336-641-3447 Where to apply for food stamps, Medicaid, and utility assistance Medicare Rights Center: 800-333-4114 Helps people with Medicare understand their rights and benefits, navigate the Medicare system, and secure the  quality healthcare they deserve SCAT: 336-333-6589 [place] [employer]'s shared-ride transportation service for eligible riders who have a disability that prevents them from riding the fixed route bus.   For additional information on assistance programs please contact our social worker:   Grier Hock/Abigail Elmore:  [phone] 

## 2017-01-02 ENCOUNTER — Ambulatory Visit: Payer: Medicare Other | Admitting: Podiatry

## 2017-01-02 ENCOUNTER — Telehealth: Payer: Self-pay | Admitting: Hematology

## 2017-01-02 NOTE — Telephone Encounter (Signed)
No LOS per 5/8

## 2017-01-02 NOTE — Progress Notes (Signed)
[facility]    HEMATOLOGY/ONCOLOGY CLINIC NOTE  Date of Service: .01/01/2017  Patient Care Team: Binnie Rail, MD as PCP - General (Internal Medicine) Clearnce Sorrel, MD (Neurology) Camelia Phenes, DPM (Podiatry) Inda Castle, MD (Gastroenterology) Binnie Rail, MD as Consulting Physician (Internal Medicine)  CHIEF COMPLAINTS/PURPOSE OF CONSULTATION:  Chronic Anemia  HISTORY OF PRESENTING ILLNESS:   TAHIRY Andrews is a wonderful 83 y.o. female who has been referred to [place] by Dr .Quay Burow, Claudina Lick, MD for evaluation and management of chronic anemia.  Patient has a h/o HTN, HLD, CVA with left hemoparesis, recurrent falls, DM2 with chronic anemia who was seen in the ED on 07/08/2016 after a fall at home when she sustained a laceration on the back of her head and had a significant amount of blood loss. She subsequently presented to the ED on 07/17/2016 from her PCP's office with worsened anemia and hgb off 7.4 and received a PRBC transfusion. Her labs showed ferritin 34 with an iron saturation of 9%, nl folate and elevated B12 levels. She was started on slow Fe iron replacement. Rpt labs on 07/24/2016 showed a hgb level of 10.4 and 08/13/2016 with hgb of 10.4 with MCV of 97 Patient had no overt evidence of GI bleeding and apparently has previously had a GI workup in 2013 which was unrevealing.  Having some constipation with slow Fe and taking OTC stool softners Notes some cognitive issues and left TMJ swelling after her fall.  No weight loss. No other overt blood loss. Is on plavix for 2nd CVA prophylaxis. No bone pain.  INTERVAL HISTORY  Ms Patricia Andrews is here for her scheduled f/u for anemia. She notes no acute new concerns. No overt bleeding issues. Hgb is stable at 10.8. If continuing to take po iron replacement.  MEDICAL HISTORY:  Past Medical History:  Diagnosis Date  . Allergic rhinitis   . Anemia   . Arthritis   . Depression    anxiety overlap  . Diabetes mellitus, type 2  ([place])   . Hyperlipidemia   . Hypertension   . Osteoporosis, senile    accidental fall w/ pelvic fx 2006, L wrist fx 10/2013  . Pelvis fracture ([place])   . Stroke [facility]) 1979   R MCA>>residual spastic L HP; TIA 06/2011    SURGICAL HISTORY: Past Surgical History:  Procedure Laterality Date  . ABDOMINAL HYSTERECTOMY  1960   Fibroid tumors  . COLONOSCOPY  06/06/2012   Procedure: COLONOSCOPY;  Surgeon: Inda Castle, MD;  [facility];  Service: Endoscopy;  Laterality: N/A;  Rm 1501. Pt to be discharged after procedure. Patient was only admitted for prep.    SOCIAL HISTORY: Social History   Social History  . Marital status: Single    Spouse name: N/A  . Number of children: 2  . Years of education: N/A   Occupational History  . RETIRED Retired   Social History Main Topics  . Smoking status: Never Smoker  . Smokeless tobacco: Never Used  . Alcohol use No  . Drug use: No  . Sexual activity: No   Other Topics Concern  . Not on file   Social History Narrative   Lives alone in independent living, single. Never married   Supportive daughter [name] -    FAMILY HISTORY: Family History  Problem Relation Age of Onset  . Arthritis Mother   . Arthritis Father     ALLERGIES:  has No Known Allergies.  MEDICATIONS:  Current Outpatient  Prescriptions  Medication Sig Dispense Refill  . acetaminophen (TYLENOL) 500 MG tablet Take 500 mg by mouth every 6 (six) hours as needed for mild pain.    Marland Kitchen amLODipine (NORVASC) 2.5 MG tablet Take 1 tablet (2.5 mg total) by mouth daily. 90 tablet 3  . atorvastatin (LIPITOR) 20 MG tablet Take 1 tablet (20 mg total) by mouth daily. 90 tablet 3  . Blood Glucose Monitoring Suppl (ONETOUCH VERIO) w/Device KIT 1 Device by Does not apply route every morning. Use to check blood sugars twice a day 1 kit 0  . CARTIA XT 120 MG 24 hr capsule TAKE 1 CAPSULE BY MOUTH  DAILY 90 capsule 1  . cholecalciferol (VITAMIN D) 1000 UNITS tablet Take 1,000  Units by mouth daily.    . citalopram (CELEXA) 20 MG tablet Take 1 tablet (20 mg total) by mouth daily. 90 tablet 3  . denosumab (PROLIA) 60 MG/ML SOLN injection Inject 60 mg into the skin every 6 (six) months. Administer in upper arm, thigh, or abdomen  Usually take on the 23rd of each month    . donepezil (ARICEPT) 5 MG tablet Take 1 tablet (5 mg total) by mouth at bedtime. 90 tablet 1  . glucose blood (ONETOUCH VERIO) test strip 1 each by Other route 2 (two) times daily. Use to check blood sugars twice a day 300 each 1  . hydroxypropyl methylcellulose / hypromellose (ISOPTO TEARS / GONIOVISC) 2.5 % ophthalmic solution Place 1 drop into both eyes as needed for dry eyes (dry eyes).    . Insulin Glargine (LANTUS SOLOSTAR) 100 UNIT/ML Solostar Pen Inject 10 Units into the skin daily. 3 pen 1  . insulin lispro (HUMALOG KWIKPEN) 100 UNIT/ML KiwkPen Inject into the skin 3 (three) times daily. (Per sliding Scale) 151-200 = 2 units, 201-250 = 4 units, 251-300 = 6 units 301-350 = 8 units, 351-400 = 10 units    . Insulin Pen Needle 32G X 4 MM MISC Use to administer insulin four times a day Dx E11.9 30 each 0  . mirtazapine (REMERON) 15 MG tablet TAKE 1 TABLET BY MOUTH AT  BEDTIME 90 tablet 1  . olmesartan (BENICAR) 20 MG tablet     . Omega-3 Fatty Acids (FISH OIL PO) Take 1 capsule by mouth every morning.    Glory Rosebush DELICA LANCETS 03J MISC Use to help check blood sugars twice a day 300 each 1  . simvastatin (ZOCOR) 20 MG tablet     . SLOW FE 142 (45 Fe) MG TBCR TAKE 1 TABLET (160 MG TOTAL) BY MOUTH DAILY.  5  . vitamin B-12 (CYANOCOBALAMIN) 100 MCG tablet Take 100 mcg by mouth daily.    Marland Kitchen zolpidem (AMBIEN) 5 MG tablet Take 1 tablet (5 mg total) by mouth at bedtime as needed. For sleep. 30 tablet 3   No current facility-administered medications for this visit.     REVIEW OF SYSTEMS:    10 Point review of Systems was done is negative except as noted above.  PHYSICAL EXAMINATION: ECOG  PERFORMANCE STATUS: 3 - Symptomatic, >50% confined to bed  . Vitals:   01/01/17 1531  BP: (!) 184/77  Pulse: 72  Resp: 18  Temp: 98.5 F (36.9 C)   Filed Weights   01/01/17 1531  Weight: 119 lb 12.8 oz (54.3 kg)   .Body mass index is 21.91 kg/m.  GENERAL:alert, in no acute distress and comfortable SKIN: skin color, texture, turgor are normal, no rashes or significant lesions EYES: normal, conjunctiva  are pink and non-injected, sclera clear OROPHARYNX:no exudate, no erythema and lips, buccal mucosa, and tongue normal  NECK: supple, no JVD, thyroid normal size, non-tender, without nodularity LYMPH:  no palpable lymphadenopathy in the cervical, axillary or inguinal LUNGS: clear to auscultation with normal respiratory effort HEART: regular rate & rhythm,  no murmurs and no lower extremity edema ABDOMEN: abdomen soft, non-tender, normoactive bowel sounds  Musculoskeletal: no cyanosis of digits and no clubbing  PSYCH: alert & oriented x 3 with fluent speech NEURO: no focal motor/sensory deficits  LABORATORY DATA:  I have reviewed the data as listed  . CBC Latest Ref Rng & Units 12/31/2016 09/18/2016 07/24/2016  WBC 4.0 - 10.5 K/uL 4.4 4.9 4.5  Hemoglobin 12.0 - 15.0 g/dL 10.8(L) 11.3(L) 10.4(L)  Hematocrit 36.0 - 46.0 % 33.9(L) 35.1 32.6(L)  Platelets 150.0 - 400.0 K/uL 184.0 172 210.0   . CBC    Component Value Date/Time   WBC 4.4 12/31/2016 1223   RBC 3.49 (L) 12/31/2016 1223   HGB 10.8 (L) 12/31/2016 1223   HGB 11.3 (L) 09/18/2016 1603   HCT 33.9 (L) 12/31/2016 1223   HCT 35.1 09/18/2016 1603   PLT 184.0 12/31/2016 1223   PLT 172 09/18/2016 1603   MCV 97.2 12/31/2016 1223   MCV 93.9 09/18/2016 1603   MCH 30.2 09/18/2016 1603   MCH 30.5 07/17/2016 1756   MCHC 31.9 12/31/2016 1223   RDW 13.8 12/31/2016 1223   RDW 13.3 09/18/2016 1603   LYMPHSABS 1.7 12/31/2016 1223   LYMPHSABS 1.6 09/18/2016 1603   MONOABS 0.4 12/31/2016 1223   MONOABS 0.3 09/18/2016 1603    EOSABS 0.1 12/31/2016 1223   EOSABS 0.1 09/18/2016 1603   BASOSABS 0.0 12/31/2016 1223   BASOSABS 0.0 09/18/2016 1603    . CMP Latest Ref Rng & Units 12/31/2016 09/18/2016 09/18/2016  Glucose 70 - 99 mg/dL 200(H) 236(H) -  BUN 6 - 23 mg/dL 36(H) 28.3(H) -  Creatinine 0.40 - 1.20 mg/dL 1.58(H) 1.6(H) -  Sodium 135 - 145 mEq/L 135 137 -  Potassium 3.5 - 5.1 mEq/L 5.0 4.6 -  Chloride 96 - 112 mEq/L 103 - -  CO2 19 - 32 mEq/L 26 23 -  Calcium 8.4 - 10.5 mg/dL 9.9 9.9 -  Total Protein 6.0 - 8.3 g/dL 7.6 7.8 7.5  Total Bilirubin 0.2 - 1.2 mg/dL 0.4 0.39 -  Alkaline Phos 39 - 117 U/L 55 81 -  AST 0 - 37 U/L 17 16 -  ALT 0 - 35 U/L 14 12 -   . Lab Results  Component Value Date   IRON 99 09/18/2016   TIBC 315 09/18/2016   IRONPCTSAT 31 09/18/2016   (Iron and TIBC)  Lab Results  Component Value Date   FERRITIN 63 09/18/2016     RADIOGRAPHIC STUDIES: I have personally reviewed the radiological images as listed and agreed with the findings in the report. No results found.  ASSESSMENT & PLAN:   83 yo AAF with multiple medical co-morbids with   1) Normocytic Anemia Patient appears to have some baseline anemia with borderline macrocytic indices with nl B12 and folate.  PBS suggestive of some dysplastic changes. Also some element of Anemia of CKD Drop in hgb in 06/2016 appears to have been from significant blood loss from her head laceration resulting from trauma related to her fall.  Patient's hgb is stable at 10.8 today.  SPEP no M spike Nl SFLC ratio PLAN -no indication for PRBC transfusion at this time -  would recommend iron replacement with slow Fe or Iron polysaccharide to maintain ferritin >100 in the setting of CKD and possibly low MDS -reasonable to take daily B complex to coverage any other B vitamin responsive anemia. -monitor for GI bleeding or other bleeding on plavix -if ferritin >100 and hgb <9 could consider empiric ESA's vs consideration of BM Bx based on goals  of care (to evaluate in more details for MDS) -would hold off on BM BX at this time in keeping with patient's goals of care and preference.  2) . Patient Active Problem List   Diagnosis Date Noted  . DOE (dyspnea on exertion) 12/31/2016  . Dementia 12/30/2016  . Hyperkalemia 07/24/2016  . Fall 07/17/2016  . Poor balance 07/11/2016  . Upper back pain 07/11/2016  . Chronic fatigue 01/16/2016  . Distal radius fracture   . Right wrist fracture 05/14/2015  . Onychomycosis 02/04/2013  . Pain due to onychomycosis of toenail 02/04/2013  . Diabetes type 2, controlled ([place]) 09/20/2011  . Hypertension 09/20/2011  . Hyperlipidemia 09/20/2011  . Senile osteoporosis 09/20/2011  . Depression with anxiety 09/20/2011  . Anemia 09/20/2011  . CVA, old, hemiparesis ([place])   Plan -continue f/u with PCP for mx of other medical co-morbids  AS per patient preference we will have her continue to f/u with Dr Quay Burow for CBC and ferritin monitoring q59month  RTC with Dr KIrene Limboon an as needed basis if he anemia significantly worsens or she developed any new cytopenias.  All of the patients questions were answered with apparent satisfaction. The patient knows to call the clinic with any problems, questions or concerns.  I spent 20 minutes counseling the patient face to face. The total time spent in the appointment was 25 minutes and more than 50% was on counseling and direct patient cares.    GSullivan LoneMD MWhite OakAAHIVMS SEastwind Surgical LLCCSt Christophers Hospital For ChildrenHematology/Oncology Physician [facility] (Office):       [phone](Work cell):  [phone](Fax):           [phone]

## 2017-01-10 ENCOUNTER — Ambulatory Visit: Payer: Medicare Other | Admitting: Podiatry

## 2017-01-15 ENCOUNTER — Other Ambulatory Visit: Payer: Self-pay | Admitting: Internal Medicine

## 2017-01-15 NOTE — Telephone Encounter (Signed)
Please advise, not on current med list.  

## 2017-01-24 ENCOUNTER — Ambulatory Visit: Payer: Self-pay | Admitting: Internal Medicine

## 2017-01-28 ENCOUNTER — Other Ambulatory Visit: Payer: Self-pay | Admitting: *Deleted

## 2017-01-28 MED ORDER — ONETOUCH DELICA LANCETS 33G MISC: 1 refills | Status: DC

## 2017-01-28 MED ORDER — GLUCOSE BLOOD VI STRP: 1.0000 | ORAL_STRIP | Freq: Two times a day (BID) | 1 refills | Status: DC

## 2017-01-28 NOTE — Telephone Encounter (Signed)
Rec'd call pt is needing one touch verio supplies sent to mail order [facility]. Verified chart sent electronically to [facility]...Raechel Chute/lmb

## 2017-01-31 ENCOUNTER — Other Ambulatory Visit: Payer: Self-pay | Admitting: Emergency Medicine

## 2017-01-31 MED ORDER — ONETOUCH VERIO FLEX SYSTEM W/DEVICE KIT: 1.0000 | PACK | Freq: Once | 0 refills | Status: AC

## 2017-01-31 MED ORDER — ONETOUCH VERIO FLEX SYSTEM W/DEVICE KIT: 1.0000 | PACK | Freq: Once | 0 refills | Status: DC

## 2017-02-10 ENCOUNTER — Encounter: Payer: Self-pay | Admitting: Internal Medicine

## 2017-02-10 DIAGNOSIS — H905 Unspecified sensorineural hearing loss: Secondary | ICD-10-CM | POA: Insufficient documentation

## 2017-02-11 ENCOUNTER — Other Ambulatory Visit: Payer: Self-pay | Admitting: Internal Medicine

## 2017-02-20 NOTE — Progress Notes (Signed)
Cardiology Office Note    Date:  02/21/2017   ID:  Patricia BarterCarmen V Andrews, DOB [birth year], MRN [id number]  PCP:  Pincus SanesBurns, Patricia J, MD  Cardiologist: Lesleigh NoeHenry W Lismary Kiehn III, MD   Chief Complaint  Patient presents with  . Shortness of Breath    History of Present Illness:  Patricia Andrews is a 83 y.o. female referred for dyspnea on exertion by Dr. Pincus SanesStacy J. Andrews.  Patricia Barterarmen V. Trim is accompanied by her daughter, Patricia Andrews. We all members of the same [employer] [employer]. Mrs. Patricia Andrews has not been seen previously by cardiology. She is here for consultation today because of dyspnea on exertion. In speaking with Mrs. Patricia Andrews and Mrs. Patricia Andrews the concern is that minimal physical activity or walking any distance beyond 25-30 yards causes shortness of breath. There is no associated discomfort. She denies orthopnea, PND, and ankle edema. There is no pre-existing history of heart disease. An echocardiogram done June 2017 revealed a structurally normal heart with EF greater than 60%. She doesn't smoke but is a known diabetic on therapy. Diagnosis of diabetes was within the past 7 years. She has never had syncope. She denies palpitations.   Past Medical History:  Diagnosis Date  . Allergic rhinitis   . Anemia   . Arthritis   . Depression    anxiety overlap  . Diabetes mellitus, type 2 (HCC)   . Hyperlipidemia   . Hypertension   . Osteoporosis, senile    accidental fall w/ pelvic fx 2006, L wrist fx 10/2013  . Pelvis fracture (HCC)   . Stroke [facility](HCC) 1979   R MCA>>residual spastic L HP; TIA 06/2011    Past Surgical History:  Procedure Laterality Date  . ABDOMINAL HYSTERECTOMY  1960   Fibroid tumors  . COLONOSCOPY  06/06/2012   Procedure: COLONOSCOPY;  Surgeon: Louis Meckelobert D Kaplan, MD;  Location: [facility];  Service: Endoscopy;  Laterality: N/A;  Rm 1501. Pt to be discharged after procedure. Patient was only admitted for prep.    Current Medications: Outpatient Medications Prior to Visit    Medication Sig Dispense Refill  . acetaminophen (TYLENOL) 500 MG tablet Take 500 mg by mouth every 6 (six) hours as needed for mild pain.    Marland Kitchen. atorvastatin (LIPITOR) 20 MG tablet Take 1 tablet (20 mg total) by mouth daily. 90 tablet 3  . cholecalciferol (VITAMIN D) 1000 UNITS tablet Take 1,000 Units by mouth daily.    . citalopram (CELEXA) 20 MG tablet TAKE 1 TABLET BY MOUTH  DAILY 90 tablet 1  . denosumab (PROLIA) 60 MG/ML SOLN injection Inject 60 mg into the skin every 6 (six) months. Administer in upper arm, thigh, or abdomen  Usually take on the 23rd of each month    . donepezil (ARICEPT) 5 MG tablet Take 1 tablet (5 mg total) by mouth at bedtime. 90 tablet 1  . glucose blood (ONETOUCH VERIO) test strip 1 each by Other route 2 (two) times daily. Use to check blood sugars twice a day 300 each 1  . hydroxypropyl methylcellulose / hypromellose (ISOPTO TEARS / GONIOVISC) 2.5 % ophthalmic solution Place 1 drop into both eyes as needed for dry eyes (dry eyes).    . Insulin Glargine (LANTUS SOLOSTAR) 100 UNIT/ML Solostar Pen Inject 10 Units into the skin daily. 3 pen 1  . insulin lispro (HUMALOG KWIKPEN) 100 UNIT/ML KiwkPen Inject into the skin 3 (three) times daily. (Per sliding Scale) 151-200 = 2 units, 201-250 = 4 units, 251-300 = 6  units 301-350 = 8 units, 351-400 = 10 units    . Insulin Pen Needle 32G X 4 MM MISC Use to administer insulin four times a day Dx E11.9 30 each 0  . mirtazapine (REMERON) 15 MG tablet TAKE 1 TABLET BY MOUTH AT  BEDTIME 90 tablet 1  . olmesartan (BENICAR) 20 MG tablet TAKE 1 TABLET BY MOUTH  DAILY 90 tablet 3  . Omega-3 Fatty Acids (FISH OIL PO) Take 1 capsule by mouth every morning.    Letta Pate DELICA LANCETS 33G MISC Use to help check blood sugars twice a day 300 each 1  . simvastatin (ZOCOR) 20 MG tablet     . SLOW FE 142 (45 Fe) MG TBCR TAKE 1 TABLET (160 MG TOTAL) BY MOUTH DAILY. 30 tablet 5  . vitamin B-12 (CYANOCOBALAMIN) 100 MCG tablet Take 100 mcg by  mouth daily.    Marland Kitchen zolpidem (AMBIEN) 5 MG tablet Take 1 tablet (5 mg total) by mouth at bedtime as needed. For sleep. 30 tablet 3  . amLODipine (NORVASC) 2.5 MG tablet Take 1 tablet (2.5 mg total) by mouth daily. 90 tablet 3  . diltiazem (CARDIZEM CD) 120 MG 24 hr capsule TAKE 1 CAPSULE BY MOUTH  DAILY 90 capsule 1  . SLOW FE 142 (45 Fe) MG TBCR TAKE 1 TABLET (160 MG TOTAL) BY MOUTH DAILY.  5   No facility-administered medications prior to visit.      Allergies:   Patient has no known allergies.   Social History   Social History  . Marital status: Single    Spouse name: N/A  . Number of children: 2  . Years of education: N/A   Occupational History  . RETIRED Retired   Social History Main Topics  . Smoking status: Never Smoker  . Smokeless tobacco: Never Used  . Alcohol use No  . Drug use: No  . Sexual activity: No   Other Topics Concern  . None   Social History Narrative   Lives alone in independent living, single. Never married   Supportive daughter Patricia Andrews -     Family History:  The patient's family history includes Rheum arthritis in her mother.   ROS:   Please see the history of present illness.    Anxiety, decreased memory, dizziness, depression, hearing loss, excessive fatigue, difficulty with balance, joint swelling. Occasional leg swelling but this is not been a major concern.  All other systems reviewed and are negative.   PHYSICAL EXAM:   VS:  BP (!) 160/80 (BP Location: Right Arm)   Pulse 73   Ht 5\' 2"  (1.575 m)   Wt 122 lb 3.2 oz (55.4 kg)   BMI 22.35 kg/m    GEN: Well nourished, well developed, in no acute distress  HEENT: normal  Neck: no JVD, carotid bruits, or masses Cardiac: RRR; no murmurs, rubs, or gallops,no edema  Respiratory:  clear to auscultation bilaterally, normal work of breathing GI: soft, nontender, nondistended, + BS MS: no deformity or atrophy  Skin: warm and dry, no rash Neuro:  Alert and Oriented x 3, Strength and sensation are  intact Psych: euthymic mood, full affect  Wt Readings from Last 3 Encounters:  02/21/17 122 lb 3.2 oz (55.4 kg)  01/01/17 119 lb 12.8 oz (54.3 kg)  12/31/16 119 lb (54 kg)      Studies/Labs Reviewed:   EKG:  EKG  Normal sinus rhythm, short PR interval, otherwise normal tracing and unchanged when compared to prior studies.  PA and lateral chest x-ray August 2017: IMPRESSION: 1. No displaced rib fracture identified on this routine chest study. 2. No acute cardiopulmonary abnormality. 3.  Calcified aortic atherosclerosis. 4. Stable appearance of mild upper lumbar vertebral compression since 2015.   Recent Labs: 12/31/2016: ALT 14; BUN 36; Creatinine, Ser 1.58; Hemoglobin 10.8; Platelets 184.0; Potassium 5.0; Sodium 135   Lipid Panel    Component Value Date/Time   CHOL 174 12/31/2016 1223   TRIG 55.0 12/31/2016 1223   HDL 103.10 12/31/2016 1223   CHOLHDL 2 12/31/2016 1223   VLDL 11.0 12/31/2016 1223   LDLCALC 60 12/31/2016 1223   LDLDIRECT 108.6 04/21/2013 1010    Additional studies/ records that were reviewed today include:   Echocardiogram June 2017:  Study Conclusions  - Left ventricle: The cavity size was normal. Systolic function was   vigorous. The estimated ejection fraction was in the range of 65%   to 70%. Wall motion was normal; there were no regional wall   motion abnormalities. Doppler parameters are consistent with   abnormal left ventricular relaxation (grade 1 diastolic   dysfunction). There was no evidence of elevated ventricular   filling pressure by Doppler parameters. - Aortic valve: Trileaflet; mildly thickened, mildly calcified   leaflets. - Aortic root: The aortic root was normal in size. - Left atrium: The atrium was normal in size. - Right ventricle: The cavity size was normal. Wall thickness was   normal. Systolic function was normal. - Right atrium: The atrium was normal in size. - Tricuspid valve: There was mild regurgitation. -  Pulmonary arteries: Systolic pressure was within the normal   range. - Inferior vena cava: The vessel was normal in size. The   respirophasic diameter changes were in the normal range (= 50%),   consistent with normal central venous pressure. - Pericardium, extracardiac: There was no pericardial effusion.  Impressions:  - Normal study for patient&'s age.   ASSESSMENT:    1. Dyspnea on exertion   2. Other hyperlipidemia   3. Essential hypertension   4. CKD (chronic kidney disease), stage III   5. Vascular dementia without behavioral disturbance   6. CVA, old, hemiparesis (HCC)   7. Controlled type 2 diabetes mellitus without complication, without long-term current use of insulin (HCC)   8. SOB (shortness of breath)      PLAN:  In order of problems listed above:  1. Dyspnea I believe to be multifactorial, related to mild to moderate anemia, physical deconditioning, and a likely component of diastolic heart failure. We will perform a BNP. Given no evidence of volume overload on exam, no changes in medical therapy are made. I do not feel the necessity to repeat the echocardiogram since her EKG is unremarkable and there is a normal cardiovascular exam. 2. Lipids are reasonable with LDL of 60 in May. Target should be less than 100 and preferably less than 70 given her diabetes diagnosis. 3. Control sodium and the diet. Blood pressure needs better control. Will increase diltiazem to 180 mg per day. Discontinue amlodipine. Further titrate diltiazem dependent upon blood pressure. Diltiazem will help diastolic dysfunction and suppress palpitations/SVT cause by short PR interval. 4. Not addressed 5. Not addressed 6. Most recent hemoglobin A1c 7.3. Early above target of less than 7. 7. See #1 above. BNP is obtained to rule out cardiac contribution.  There is no overt heart failure. Dyspnea on exertion is probably contributed to by diastolic dysfunction/heart failure. No gross by him  overload. Coronary disease if  present is not symptomatic in terms of angina. Dyspnea could be an equivalent. Given age and other medical concerns, ischemic evaluation does not seem indicated at this time. If BNP is not elevated, and will likely not be further cardiac issues to address. We do need to titrate diltiazem to control blood pressure to 140/90 mmHg or less. She will record blood pressure twice per week for the next 2-3 weeks and call results.   Medication Adjustments/Labs and Tests Ordered: Current medicines are reviewed at length with the patient today.  Concerns regarding medicines are outlined above.  Medication changes, Labs and Tests ordered today are listed in the Patient Instructions below. Patient Instructions  Medication Instructions:  1) DISCONTINUE Amlodipine 2) INCREASE Diltiazem to 180mg  once daily  Labwork: BNP today  Testing/Procedures: None  Follow-Up: Your physician recommends that you schedule a follow-up appointment as needed with Dr. Katrinka Blazing.   Any Other Special Instructions Will Be Listed Below (If Applicable).  Please monitor your blood pressure twice a week for 3 weeks and then call our office with those readings.     If you need a refill on your cardiac medications before your next appointment, please call your pharmacy.      Signed, Lesleigh Noe, MD  02/21/2017 12:37 PM    [facility] [address] [place], [place], [place]  [address] Phone: [phone]; Fax: [phone]

## 2017-02-21 ENCOUNTER — Encounter: Payer: Self-pay | Admitting: Interventional Cardiology

## 2017-02-21 ENCOUNTER — Ambulatory Visit (INDEPENDENT_AMBULATORY_CARE_PROVIDER_SITE_OTHER): Payer: Medicare Other | Admitting: Interventional Cardiology

## 2017-02-21 VITALS — BP 160/80 | HR 73 | Ht 62.0 in | Wt 122.2 lb

## 2017-02-21 DIAGNOSIS — R0602 Shortness of breath: Secondary | ICD-10-CM

## 2017-02-21 DIAGNOSIS — I69359 Hemiplegia and hemiparesis following cerebral infarction affecting unspecified side: Secondary | ICD-10-CM

## 2017-02-21 DIAGNOSIS — E784 Other hyperlipidemia: Secondary | ICD-10-CM | POA: Diagnosis not present

## 2017-02-21 DIAGNOSIS — R0609 Other forms of dyspnea: Secondary | ICD-10-CM

## 2017-02-21 DIAGNOSIS — I10 Essential (primary) hypertension: Secondary | ICD-10-CM | POA: Diagnosis not present

## 2017-02-21 DIAGNOSIS — E7849 Other hyperlipidemia: Secondary | ICD-10-CM

## 2017-02-21 DIAGNOSIS — N183 Chronic kidney disease, stage 3 unspecified: Secondary | ICD-10-CM

## 2017-02-21 DIAGNOSIS — E119 Type 2 diabetes mellitus without complications: Secondary | ICD-10-CM | POA: Diagnosis not present

## 2017-02-21 DIAGNOSIS — F0150 Vascular dementia without behavioral disturbance: Secondary | ICD-10-CM

## 2017-02-21 LAB — PRO B NATRIURETIC PEPTIDE: NT-Pro BNP: 356 pg/mL (ref 0–738)

## 2017-02-21 MED ORDER — DILTIAZEM HCL ER COATED BEADS 180 MG PO CP24: 180.0000 mg | ORAL_CAPSULE | Freq: Every day | ORAL | 3 refills | Status: DC

## 2017-02-21 NOTE — Patient Instructions (Signed)
Medication Instructions:  1) DISCONTINUE Amlodipine 2) INCREASE Diltiazem to 180mg  once daily  Labwork: BNP today  Testing/Procedures: None  Follow-Up: Your physician recommends that you schedule a follow-up appointment as needed with Dr. Katrinka BlazingSmith.   Any Other Special Instructions Will Be Listed Below (If Applicable).  Please monitor your blood pressure twice a week for 3 weeks and then call our office with those readings.     If you need a refill on your cardiac medications before your next appointment, please call your pharmacy.

## 2017-03-11 ENCOUNTER — Other Ambulatory Visit: Payer: Self-pay | Admitting: Internal Medicine

## 2017-03-11 NOTE — Telephone Encounter (Signed)
I think this was d/c'd - call her daughter to confirm

## 2017-03-11 NOTE — Telephone Encounter (Signed)
Not on current med list. Please advise 

## 2017-03-19 NOTE — Telephone Encounter (Signed)
Spoke with Pts daughter, pt is not currently taking Plavix. RX refused.

## 2017-03-29 ENCOUNTER — Other Ambulatory Visit: Payer: Self-pay | Admitting: Internal Medicine

## 2017-04-04 ENCOUNTER — Ambulatory Visit (INDEPENDENT_AMBULATORY_CARE_PROVIDER_SITE_OTHER): Payer: Medicare Other | Admitting: Internal Medicine

## 2017-04-04 ENCOUNTER — Telehealth: Payer: Self-pay | Admitting: Emergency Medicine

## 2017-04-04 ENCOUNTER — Other Ambulatory Visit (INDEPENDENT_AMBULATORY_CARE_PROVIDER_SITE_OTHER): Payer: Medicare Other

## 2017-04-04 ENCOUNTER — Encounter: Payer: Self-pay | Admitting: Internal Medicine

## 2017-04-04 VITALS — BP 144/76 | HR 71 | Temp 98.7°F | Resp 16 | Wt 123.0 lb

## 2017-04-04 DIAGNOSIS — M810 Age-related osteoporosis without current pathological fracture: Secondary | ICD-10-CM

## 2017-04-04 DIAGNOSIS — D649 Anemia, unspecified: Secondary | ICD-10-CM | POA: Diagnosis not present

## 2017-04-04 DIAGNOSIS — I10 Essential (primary) hypertension: Secondary | ICD-10-CM

## 2017-04-04 DIAGNOSIS — F0150 Vascular dementia without behavioral disturbance: Secondary | ICD-10-CM | POA: Diagnosis not present

## 2017-04-04 DIAGNOSIS — E119 Type 2 diabetes mellitus without complications: Secondary | ICD-10-CM

## 2017-04-04 LAB — CBC WITH DIFFERENTIAL/PLATELET
BASOS PCT: 0.6 % (ref 0.0–3.0)
Basophils Absolute: 0 10*3/uL (ref 0.0–0.1)
EOS PCT: 1.3 % (ref 0.0–5.0)
Eosinophils Absolute: 0.1 10*3/uL (ref 0.0–0.7)
HEMATOCRIT: 34.4 % — AB (ref 36.0–46.0)
HEMOGLOBIN: 11.1 g/dL — AB (ref 12.0–15.0)
Lymphocytes Relative: 34.5 % (ref 12.0–46.0)
Lymphs Abs: 1.7 10*3/uL (ref 0.7–4.0)
MCHC: 32.4 g/dL (ref 30.0–36.0)
MCV: 97.3 fl (ref 78.0–100.0)
MONOS PCT: 7.9 % (ref 3.0–12.0)
Monocytes Absolute: 0.4 10*3/uL (ref 0.1–1.0)
Neutro Abs: 2.7 10*3/uL (ref 1.4–7.7)
Neutrophils Relative %: 55.7 % (ref 43.0–77.0)
Platelets: 183 10*3/uL (ref 150.0–400.0)
RBC: 3.53 Mil/uL — AB (ref 3.87–5.11)
RDW: 13 % (ref 11.5–15.5)
WBC: 4.9 10*3/uL (ref 4.0–10.5)

## 2017-04-04 LAB — COMPREHENSIVE METABOLIC PANEL
ALBUMIN: 4.5 g/dL (ref 3.5–5.2)
ALK PHOS: 71 U/L (ref 39–117)
ALT: 15 U/L (ref 0–35)
AST: 16 U/L (ref 0–37)
BILIRUBIN TOTAL: 0.4 mg/dL (ref 0.2–1.2)
BUN: 29 mg/dL — ABNORMAL HIGH (ref 6–23)
CALCIUM: 9.5 mg/dL (ref 8.4–10.5)
CO2: 29 meq/L (ref 19–32)
Chloride: 103 mEq/L (ref 96–112)
Creatinine, Ser: 1.53 mg/dL — ABNORMAL HIGH (ref 0.40–1.20)
GFR: 41.08 mL/min — AB (ref >60.00)
GLUCOSE: 224 mg/dL — AB (ref 70–99)
POTASSIUM: 5.1 meq/L (ref 3.5–5.1)
Sodium: 136 mEq/L (ref 135–145)
TOTAL PROTEIN: 7.8 g/dL (ref 6.0–8.3)

## 2017-04-04 LAB — IRON: Iron: 108 ug/dL (ref 42–145)

## 2017-04-04 LAB — HEMOGLOBIN A1C: Hgb A1c MFr Bld: 7 % — ABNORMAL HIGH (ref 4.6–6.5)

## 2017-04-04 LAB — VITAMIN D 25 HYDROXY (VIT D DEFICIENCY, FRACTURES): VITD: 105.4 ng/mL — AB (ref 30.00–100.00)

## 2017-04-04 LAB — FERRITIN: Ferritin: 74.9 ng/mL (ref 10.0–291.0)

## 2017-04-04 MED ORDER — DONEPEZIL HCL 10 MG PO TABS: 10.0000 mg | ORAL_TABLET | Freq: Every day | ORAL | 1 refills | Status: DC

## 2017-04-04 MED ORDER — DENOSUMAB 60 MG/ML ~~LOC~~ SOLN
60.0000 mg | Freq: Once | SUBCUTANEOUS | Status: AC
  Administered 2017-04-04: 60 mg via SUBCUTANEOUS

## 2017-04-04 NOTE — Progress Notes (Signed)
Subjective:    Patient ID: Patricia Andrews, female    DOB: 1925-09-06, 83 y.o.   MRN: [id number]  HPI She is here for follow up.       Hypertension: She is taking her medication daily. She is compliant with a low sodium diet.  She denies chest pain, palpitations, edema, and regular headaches. She is not exercising regularly.  She does monitor her blood pressure at home - 130/80.  Diabetes: She is taking her medication daily as prescribed. She is compliant with a diabetic diet. She is not exercising regularly. She monitors her sugars and they have been running 202 highest, but typically much lower.    Osteoporosis: she is to have her prolia today.   She is taking her calcium and vitamin D daily.  She is not exercising.   If she is going someplace new she will a have a panic attack.  She gets very anxious. She does not do that when to familiar places.    Demenita:  She is taking aricept nightly. She does get mildly confused at times and does have some memory issues.  She did get hearing aids and that has helped her hearing.  Her daughter wonders about increasing the aricept to 10 mg.     Medications and allergies reviewed with patient and updated if appropriate.  Patient Active Problem List   Diagnosis Date Noted  . Sensorineural hearing loss 02/10/2017  . Dyspnea on exertion 12/31/2016  . Dementia 12/30/2016  . Hyperkalemia 07/24/2016  . Fall 07/17/2016  . Poor balance 07/11/2016  . Upper back pain 07/11/2016  . Chronic fatigue 01/16/2016  . Distal radius fracture   . Right wrist fracture 05/14/2015  . Onychomycosis 02/04/2013  . Pain due to onychomycosis of toenail 02/04/2013  . Diabetes type 2, controlled (HCC) 09/20/2011  . Hypertension 09/20/2011  . Hyperlipidemia 09/20/2011  . Senile osteoporosis 09/20/2011  . Depression with anxiety 09/20/2011  . Anemia 09/20/2011  . CVA, old, hemiparesis (HCC)     Current Outpatient Prescriptions on File Prior to Visit    Medication Sig Dispense Refill  . acetaminophen (TYLENOL) 500 MG tablet Take 500 mg by mouth every 6 (six) hours as needed for mild pain.    Marland Kitchen. atorvastatin (LIPITOR) 20 MG tablet Take 1 tablet (20 mg total) by mouth daily. 90 tablet 3  . cholecalciferol (VITAMIN D) 1000 UNITS tablet Take 1,000 Units by mouth daily.    . citalopram (CELEXA) 20 MG tablet TAKE 1 TABLET BY MOUTH  DAILY 90 tablet 1  . denosumab (PROLIA) 60 MG/ML SOLN injection Inject 60 mg into the skin every 6 (six) months. Administer in upper arm, thigh, or abdomen  Usually take on the 23rd of each month    . diltiazem (CARDIZEM CD) 180 MG 24 hr capsule Take 1 capsule (180 mg total) by mouth daily. 90 capsule 3  . donepezil (ARICEPT) 5 MG tablet Take 1 tablet (5 mg total) by mouth at bedtime. 90 tablet 1  . glucose blood (ONETOUCH VERIO) test strip 1 each by Other route 2 (two) times daily. Use to check blood sugars twice a day 300 each 1  . hydroxypropyl methylcellulose / hypromellose (ISOPTO TEARS / GONIOVISC) 2.5 % ophthalmic solution Place 1 drop into both eyes as needed for dry eyes (dry eyes).    . insulin lispro (HUMALOG KWIKPEN) 100 UNIT/ML KiwkPen Inject into the skin 3 (three) times daily. (Per sliding Scale) 151-200 = 2 units, 201-250 = 4 units,  251-300 = 6 units 301-350 = 8 units, 351-400 = 10 units    . Insulin Pen Needle 32G X 4 MM MISC Use to administer insulin four times a day Dx E11.9 30 each 0  . LANTUS SOLOSTAR 100 UNIT/ML Solostar Pen INJECT SUBCUTANEOUSLY 10  UNITS DAILY 15 mL 1  . mirtazapine (REMERON) 15 MG tablet TAKE 1 TABLET BY MOUTH AT  BEDTIME 90 tablet 1  . olmesartan (BENICAR) 20 MG tablet TAKE 1 TABLET BY MOUTH  DAILY 90 tablet 3  . Omega-3 Fatty Acids (FISH OIL PO) Take 1 capsule by mouth every morning.    Letta Pate DELICA LANCETS 33G MISC Use to help check blood sugars twice a day 300 each 1  . simvastatin (ZOCOR) 20 MG tablet     . SLOW FE 142 (45 Fe) MG TBCR TAKE 1 TABLET (160 MG TOTAL) BY  MOUTH DAILY. 30 tablet 5  . vitamin B-12 (CYANOCOBALAMIN) 100 MCG tablet Take 100 mcg by mouth daily.    Marland Kitchen zolpidem (AMBIEN) 5 MG tablet Take 1 tablet (5 mg total) by mouth at bedtime as needed. For sleep. 30 tablet 3   No current facility-administered medications on file prior to visit.     Past Medical History:  Diagnosis Date  . Allergic rhinitis   . Anemia   . Arthritis   . Depression    anxiety overlap  . Diabetes mellitus, type 2 (HCC)   . Hyperlipidemia   . Hypertension   . Osteoporosis, senile    accidental fall w/ pelvic fx 2006, L wrist fx 10/2013  . Pelvis fracture (HCC)   . Stroke [facility]) 1979   R MCA>>residual spastic L HP; TIA 06/2011    Past Surgical History:  Procedure Laterality Date  . ABDOMINAL HYSTERECTOMY  1960   Fibroid tumors  . COLONOSCOPY  06/06/2012   Procedure: COLONOSCOPY;  Surgeon: Louis Meckel, MD;  Location: [facility];  Service: Endoscopy;  Laterality: N/A;  Rm 1501. Pt to be discharged after procedure. Patient was only admitted for prep.    Social History   Social History  . Marital status: Single    Spouse name: N/A  . Number of children: 2  . Years of education: N/A   Occupational History  . RETIRED Retired   Social History Main Topics  . Smoking status: Never Smoker  . Smokeless tobacco: Never Used  . Alcohol use No  . Drug use: No  . Sexual activity: No   Other Topics Concern  . Not on file   Social History Narrative   Lives alone in independent living, single. Never married   Supportive daughter [name] -    Family History  Problem Relation Age of Onset  . Rheum arthritis Mother     Review of Systems  Constitutional: Negative for appetite change, chills and fever.  Respiratory: Positive for shortness of breath (with exertion). Negative for cough and wheezing.   Cardiovascular: Positive for palpitations. Negative for chest pain and leg swelling.  Neurological: Negative for light-headedness and headaches.        Objective:   Vitals:   04/04/17 1128 04/04/17 1146  BP: (!) 162/82 (!) 144/76  Pulse: 71   Resp: 16   Temp: 98.7 F (37.1 C)   SpO2: 98%    Filed Weights   04/04/17 1128  Weight: 123 lb (55.8 kg)   Body mass index is 22.5 kg/m.  Wt Readings from Last 3 Encounters:  04/04/17 123 lb (55.8 kg)  02/21/17 122 lb 3.2 oz (55.4 kg)  01/01/17 119 lb 12.8 oz (54.3 kg)     Physical Exam Constitutional: Appears well-developed and well-nourished. No distress.  HENT:  Head: Normocephalic and atraumatic.  Neck: Neck supple. No tracheal deviation present. No thyromegaly present.  No cervical lymphadenopathy Cardiovascular: Normal rate, regular rhythm and normal heart sounds.   No murmur heard. No carotid bruit .  No edema Pulmonary/Chest: Effort normal and breath sounds normal. No respiratory distress. No has no wheezes. No rales.  Skin: Skin is warm and dry. Not diaphoretic.  Psychiatric: Normal mood and affect. Behavior is normal.         Assessment & Plan:   See Problem List for Assessment and Plan of chronic medical problems.

## 2017-04-04 NOTE — Patient Instructions (Addendum)
  Test(s) ordered today. Your results will be released to MyChart (or called to you) after review, usually within 72hours after test completion. If any changes need to be made, you will be notified at that same time.   Medications reviewed and updated.  Changes include increasing aricept to 10 mg nightly.   Your prescription(s) have been submitted to your pharmacy. Please take as directed and contact our office if you believe you are having problem(s) with the medication(s).  A prolia injection was given today.   Please followup in 6 months

## 2017-04-04 NOTE — Assessment & Plan Note (Signed)
Check a1c Low sugar / carb diet Will adjust medication if needed

## 2017-04-04 NOTE — Assessment & Plan Note (Signed)
prolia injection today Check vitamin D level, cmp

## 2017-04-04 NOTE — Assessment & Plan Note (Signed)
Taking iron Check cbc, iron/ferritin

## 2017-04-04 NOTE — Assessment & Plan Note (Addendum)
Elevated here today, typically better controlled Current regimen effective and well tolerated Continue current medications at current doses cmp

## 2017-04-04 NOTE — Assessment & Plan Note (Signed)
Taking aricept 5 mg nightly w/o side effects Will increase aricept to 10 mg nightly

## 2017-04-04 NOTE — Telephone Encounter (Signed)
CRITICAL VALUE STICKER  CRITICAL VALUE: Vitamin D 105.40

## 2017-04-06 NOTE — Telephone Encounter (Signed)
Please call her regarding her blood work.   Her vitamin d level is very high.  How much vitamin d is she taking?  She should hold it for now and will need to take a lower dose in the future, but I need to know how much she ist taking.   Her kidney function is stable.  Her liver tests, iron levels are normal.  Her anemia is improved and mild.  Her sugars are well controlled - her a1c is 7%

## 2017-04-08 NOTE — Telephone Encounter (Signed)
LVM for pts daughter to call back and discuss.  

## 2017-04-11 NOTE — Telephone Encounter (Signed)
LVM with pts daughter to call back for results.

## 2017-04-18 NOTE — Telephone Encounter (Signed)
Spoke with pts daughter, she states she is taking Vit D3 1000 u/ 25 mcg daily. Informed her of the results and to stop the Vit D for now.

## 2017-05-07 ENCOUNTER — Encounter: Payer: Self-pay | Admitting: Podiatry

## 2017-05-07 ENCOUNTER — Other Ambulatory Visit: Payer: Self-pay | Admitting: Internal Medicine

## 2017-05-07 ENCOUNTER — Ambulatory Visit (INDEPENDENT_AMBULATORY_CARE_PROVIDER_SITE_OTHER): Payer: Medicare Other | Admitting: Podiatry

## 2017-05-07 DIAGNOSIS — B351 Tinea unguium: Secondary | ICD-10-CM

## 2017-05-07 DIAGNOSIS — M79671 Pain in right foot: Secondary | ICD-10-CM | POA: Diagnosis not present

## 2017-05-07 DIAGNOSIS — M79672 Pain in left foot: Secondary | ICD-10-CM | POA: Diagnosis not present

## 2017-05-07 NOTE — Progress Notes (Signed)
Subjective:  [age]year old female presents requesting toe nails and calluses trimmed. Denies any new problems.  History of multiple falls since she was in last year and wears wrist brace on right.  Wears AFO on left foot and leg, and ambulating well using a cane.  Now she is living with her daughter.  Objective: Hypertrophic nails x 10.  Hyperpigmented right bunion without erythema or edema. Plantar callus on left great toe and 2nd MPJ right.  Left foot pedal pulses are not palpable, right side is normal with palpable pedal pulses.   Assessment: Dystrophic nails x 10.  Painful feet with corns, calluses,and toe nails.  Status post stroke with paralysis on left side arm and leg.   Plan: All nails, corns and calluses debrided.  Return in 3 months or as needed.

## 2017-05-07 NOTE — Patient Instructions (Signed)
Seen for hypertrophic nails. All nails debrided. Return in 3 months or as needed.  

## 2017-07-02 ENCOUNTER — Other Ambulatory Visit: Payer: Self-pay | Admitting: Emergency Medicine

## 2017-07-02 MED ORDER — CITALOPRAM HYDROBROMIDE 20 MG PO TABS: 20.0000 mg | ORAL_TABLET | Freq: Every day | ORAL | 1 refills | Status: DC

## 2017-07-16 ENCOUNTER — Other Ambulatory Visit: Payer: Self-pay | Admitting: Emergency Medicine

## 2017-08-01 ENCOUNTER — Telehealth: Payer: Self-pay | Admitting: Internal Medicine

## 2017-08-01 NOTE — Telephone Encounter (Deleted)
Copied from CRM [id number]. Topic: Quick Communication - See Telephone Encounter >> Aug 01, 2017  9:51 AM Trula SladeWalter, Linda F wrote: CRM for notification. See Telephone encounter for:  08/01/17. E R R O R - Patient changed their mind.

## 2017-08-01 NOTE — Telephone Encounter (Signed)
E R R O R - Pt chg mind about request.

## 2017-08-06 ENCOUNTER — Ambulatory Visit: Payer: Medicare Other | Admitting: Podiatry

## 2017-08-27 ENCOUNTER — Other Ambulatory Visit: Payer: Self-pay | Admitting: Internal Medicine

## 2017-09-04 IMAGING — CR DG HAND COMPLETE 3+V*R*
3 series · 3 of 3 positions shown · non-contrast
Comparison: None.

CLINICAL DATA: Pt reports losing her balance, while trying to catch
a falling bowl, and fell today on her right wrist. Swelling and pain
noted to right distal radius/navicular area. Denies pain in hand.

EXAM:
RIGHT HAND - COMPLETE 3+ VIEW

[x hand pa right]
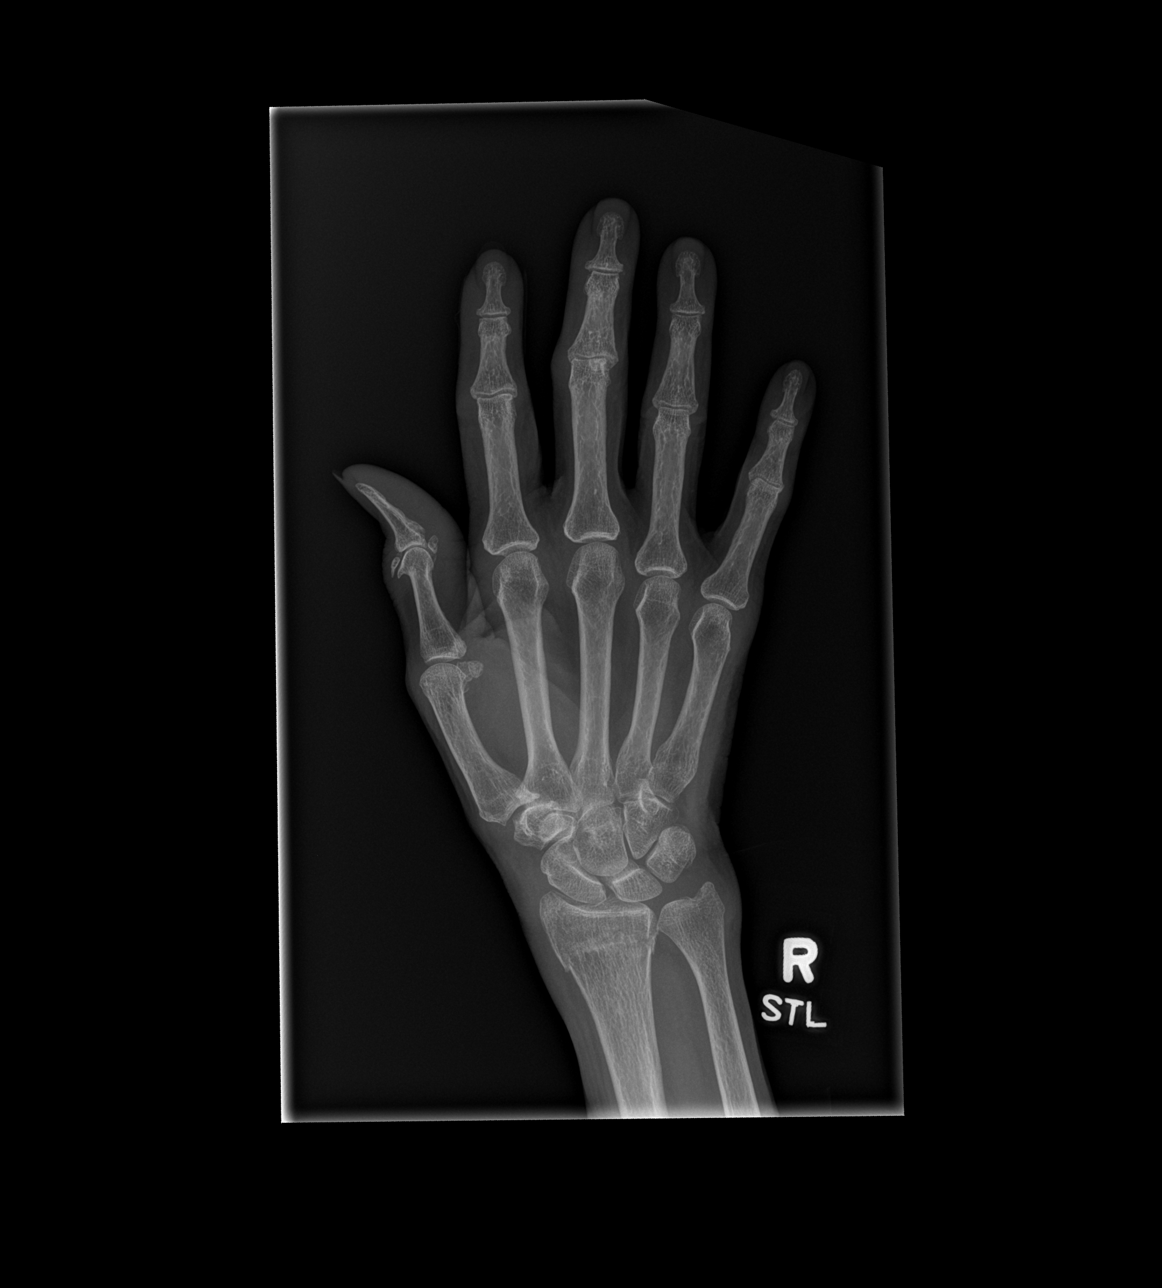

[x hand obl right]
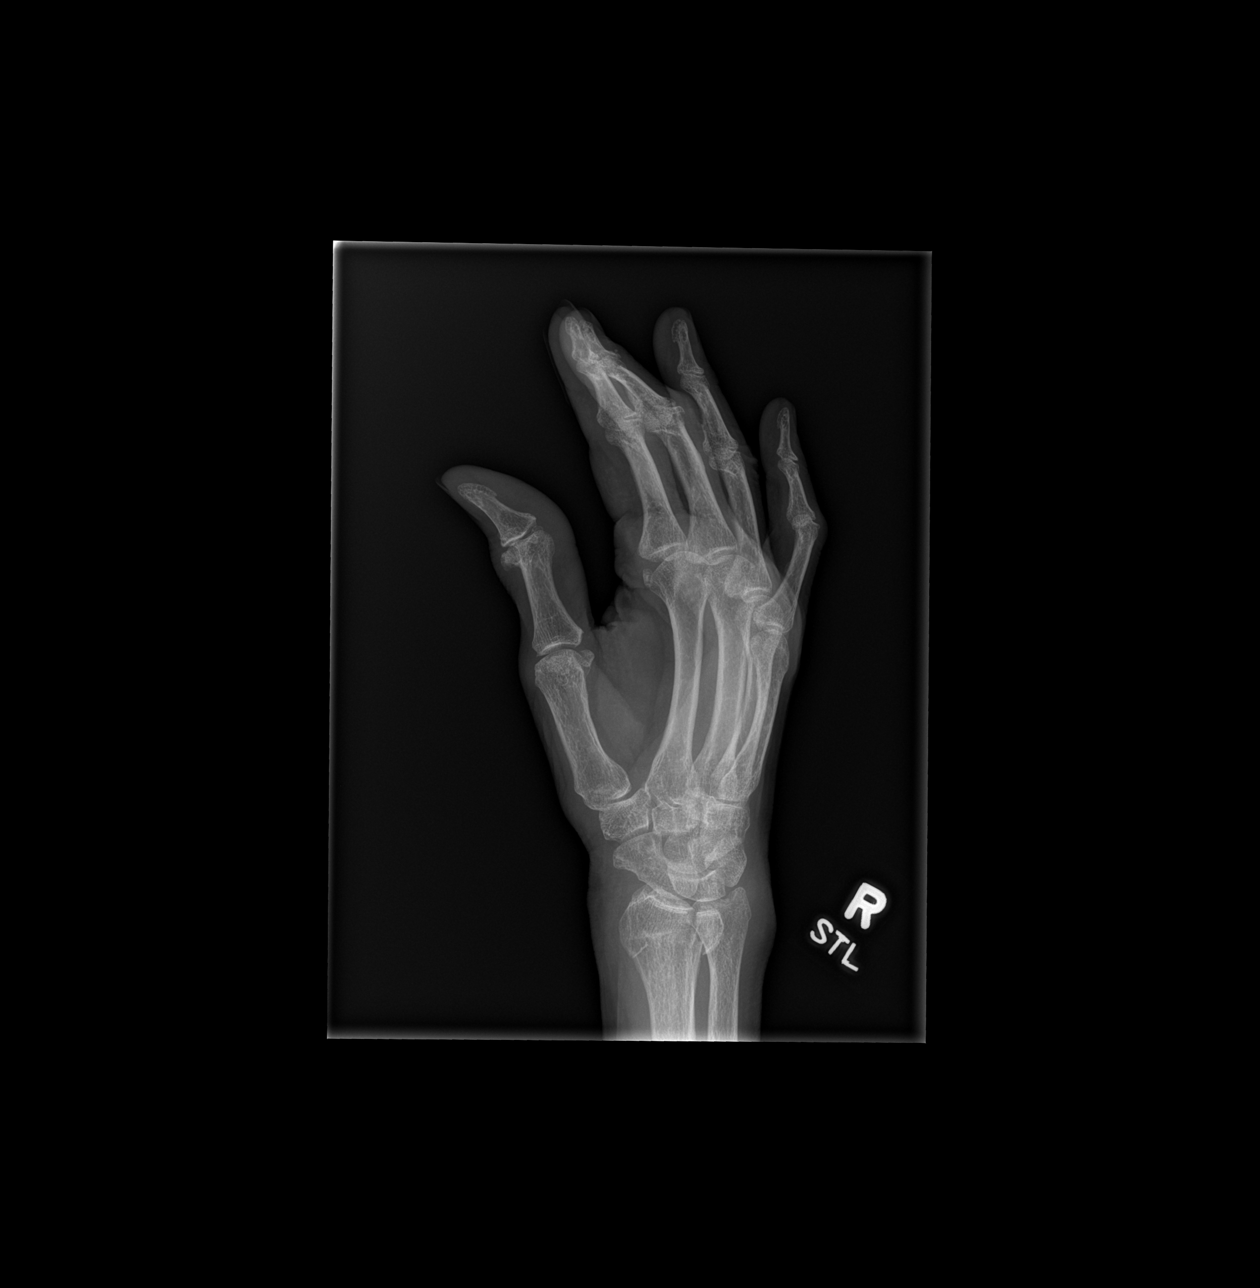

[x hand lat right]
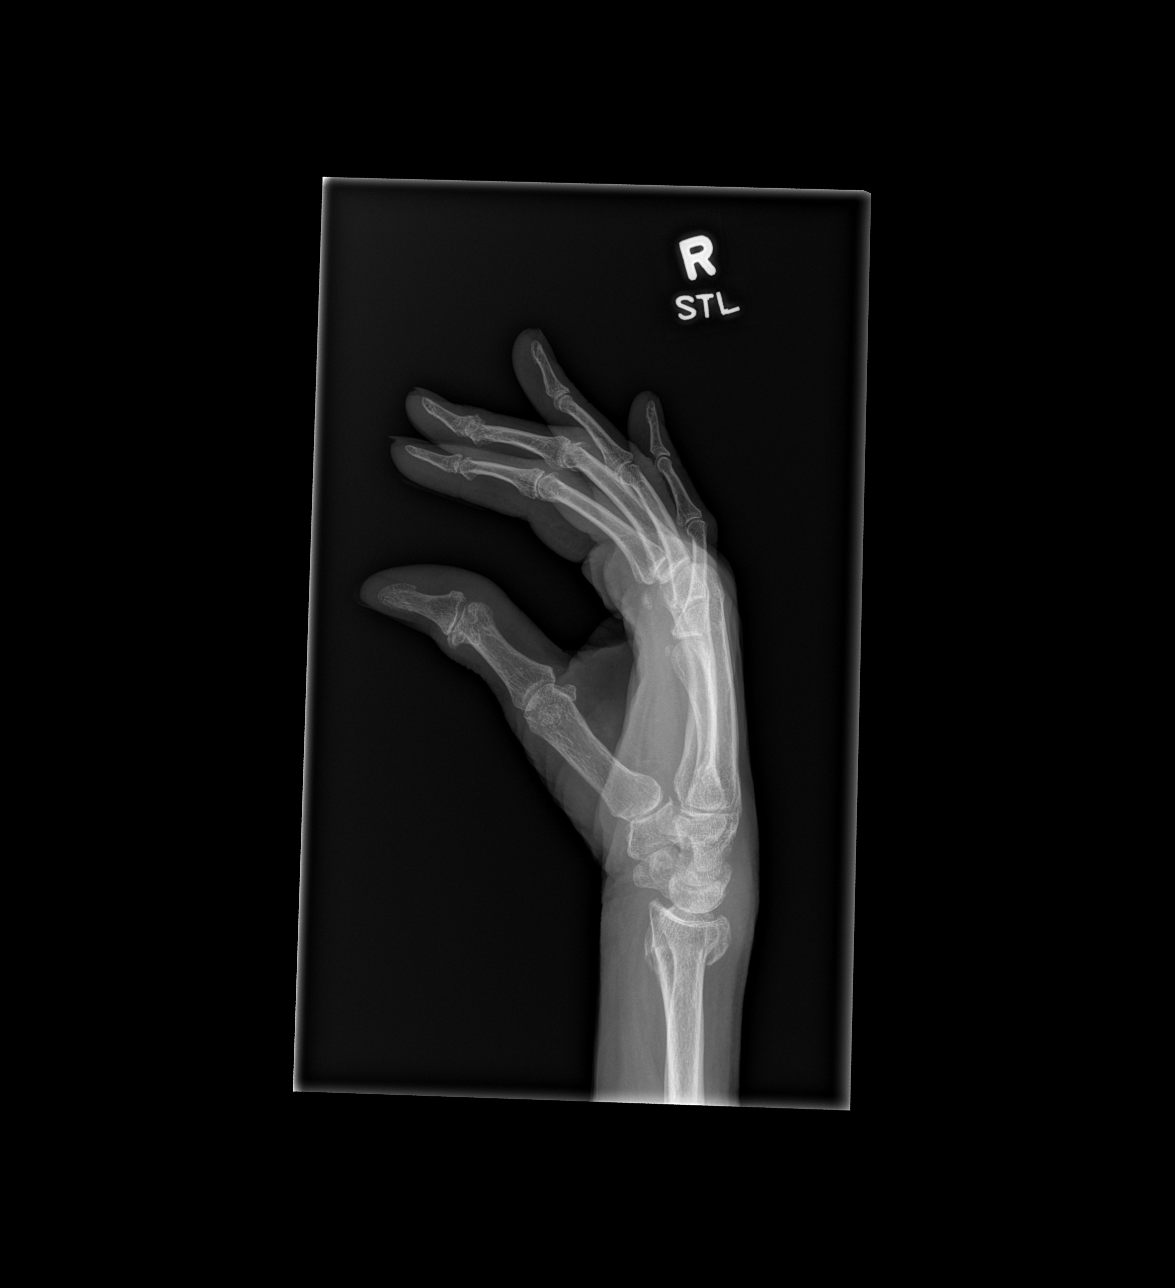

[3 of 3 positions shown; findings below may reference images not displayed]

FINDINGS: Fracture of the distal radius. Please refer to the right wrist
radiographs for further details.

No other fractures. Mild degenerative changes noted involving
several of the interphalangeal joints most prominently the PIP joint
of the middle finger. Bones are demineralized. There is right wrist
soft tissue swelling.
IMPRESSION: Fracture distal right radius.  No hand fracture.  No dislocation.

## 2017-10-03 ENCOUNTER — Telehealth: Payer: Self-pay

## 2017-10-03 NOTE — Telephone Encounter (Signed)
Talked with patient's daughter---patient is currently in [place] for a little while---she is getting her prolia injection at doctor's office in [place]---she will call back with injection date so that I will know when to reverify for next prolia---can talk with Reganne Messerschmidt,RN at [facility] office if needed

## 2017-10-04 ENCOUNTER — Ambulatory Visit: Payer: Self-pay | Admitting: Internal Medicine

## 2017-11-11 ENCOUNTER — Other Ambulatory Visit: Payer: Self-pay | Admitting: Interventional Cardiology

## 2017-12-01 ENCOUNTER — Other Ambulatory Visit: Payer: Self-pay | Admitting: Internal Medicine

## 2018-01-02 NOTE — Telephone Encounter (Signed)
Can you close this please, Thank you.  °

## 2018-02-09 NOTE — Patient Instructions (Addendum)
  Test(s) ordered today. Your results will be released to MyChart (or called to you) after review, usually within 72hours after test completion. If any changes need to be made, you will be notified at that same time.   Medications reviewed and updated.  Changes include increasing diltiazem to 240 mg daily.  Your prescription(s) have been submitted to your pharmacy. Please take as directed and contact our office if you believe you are having problem(s) with the medication(s).    Please followup in 4 weeks

## 2018-02-09 NOTE — Progress Notes (Signed)
Subjective:    Patient ID: Patricia Andrews, female    DOB: Jan 19, 1926, 84 y.o.   MRN: [id number]  HPI The patient is here for follow up.  She is here with her daughter.  She just spent 9 months in [place] with her other daughter.  She is sedentary.    Hypertension: She is taking her medication daily. She is compliant with a low sodium diet.  She denies chest pain and regular headaches. She is not exercising regularly.  She does monitor her blood pressure at home and it has been more elevated since she returned home.    Diabetes: She is taking her medication daily as prescribed. She is compliant with a diabetic diet. She is not exercising regularly. She monitors her sugars and it was 166 this monring.    Hyperlipidemia: She is taking her medication daily, but is unsure if she is taking the atorvastatin or simvastatin-both medications are on her list. She is compliant with a low fat/cholesterol diet. She is not exercising regularly. She denies myalgias.   Dementia:  She is taking aricept 10 mg at night.    Depression, anxiety:  She is taking celexa daily and remeron nightly.  She denies any depression.  She does feel anxious at times.  Since coming back from [place] she feels somewhat overwhelmed trying to get back into her routine here.  Insomnia; she does have difficulty sleeping.  She has to take the Ambien nightly or else she will not sleep.  Medications and allergies reviewed with patient and updated if appropriate.  Patient Active Problem List   Diagnosis Date Noted  . Sensorineural hearing loss 02/10/2017  . Dyspnea on exertion 12/31/2016  . Dementia 12/30/2016  . Hyperkalemia 07/24/2016  . Fall 07/17/2016  . Poor balance 07/11/2016  . Upper back pain 07/11/2016  . Chronic fatigue 01/16/2016  . Distal radius fracture   . Right wrist fracture 05/14/2015  . Onychomycosis 02/04/2013  . Pain due to onychomycosis of toenail 02/04/2013  . Diabetes type 2, controlled ([place])  09/20/2011  . Hypertension 09/20/2011  . Hyperlipidemia 09/20/2011  . Senile osteoporosis 09/20/2011  . Depression with anxiety 09/20/2011  . Anemia 09/20/2011  . CVA, old, hemiparesis ([place])     Current Outpatient Medications on File Prior to Visit  Medication Sig Dispense Refill  . acetaminophen (TYLENOL) 500 MG tablet Take 500 mg by mouth every 6 (six) hours as needed for mild pain.    Marland Kitchen atorvastatin (LIPITOR) 20 MG tablet TAKE 1 TABLET BY MOUTH  DAILY 90 tablet 1  . Blood Glucose Monitoring Suppl (ONETOUCH VERIO FLEX SYSTEM) w/Device KIT USE TO CHECK BLOOD SUGAR  TWO TIMES DAILY 100 kit 3  . cholecalciferol (VITAMIN D) 1000 UNITS tablet Take 1,000 Units by mouth daily.    . citalopram (CELEXA) 20 MG tablet Take 1 tablet (20 mg total) daily by mouth. 90 tablet 1  . denosumab (PROLIA) 60 MG/ML SOLN injection Inject 60 mg into the skin every 6 (six) months. Administer in upper arm, thigh, or abdomen  Usually take on the 23rd of each month    . donepezil (ARICEPT) 10 MG tablet TAKE 1 TABLET BY MOUTH AT  BEDTIME 90 tablet 1  . hydroxypropyl methylcellulose / hypromellose (ISOPTO TEARS / GONIOVISC) 2.5 % ophthalmic solution Place 1 drop into both eyes as needed for dry eyes (dry eyes).    . insulin lispro (HUMALOG KWIKPEN) 100 UNIT/ML KiwkPen Inject into the skin 3 (three) times daily. (Per sliding  Scale) 151-200 = 2 units, 201-250 = 4 units, 251-300 = 6 units 301-350 = 8 units, 351-400 = 10 units    . Insulin Pen Needle 32G X 4 MM MISC Use to administer insulin four times a day Dx E11.9 30 each 0  . LANTUS SOLOSTAR 100 UNIT/ML Solostar Pen INJECT SUBCUTANEOUSLY 10  UNITS DAILY 15 mL 1  . mirtazapine (REMERON) 15 MG tablet TAKE 1 TABLET BY MOUTH AT  BEDTIME 90 tablet 1  . olmesartan (BENICAR) 20 MG tablet TAKE 1 TABLET BY MOUTH  DAILY 90 tablet 3  . Omega-3 Fatty Acids (FISH OIL PO) Take 1 capsule by mouth every morning.    Glory Rosebush DELICA LANCETS 82N MISC USE TO HELP CHECK BLOOD  SUGARS  TWICE A DAY 200 each 1  . ONETOUCH VERIO test strip USE TO CHECK BLOOD SUGARS  TWICE A DAY 200 each 1  . vitamin B-12 (CYANOCOBALAMIN) 100 MCG tablet Take 100 mcg by mouth daily.     No current facility-administered medications on file prior to visit.     Past Medical History:  Diagnosis Date  . Allergic rhinitis   . Anemia   . Arthritis   . Depression    anxiety overlap  . Diabetes mellitus, type 2 ([place])   . Hyperlipidemia   . Hypertension   . Osteoporosis, senile    accidental fall w/ pelvic fx 2006, L wrist fx 10/2013  . Pelvis fracture ([place])   . Stroke [facility]) 1979   R MCA>>residual spastic L HP; TIA 06/2011    Past Surgical History:  Procedure Laterality Date  . ABDOMINAL HYSTERECTOMY  1960   Fibroid tumors  . COLONOSCOPY  06/06/2012   Procedure: COLONOSCOPY;  Surgeon: Inda Castle, MD;  Location: [facility] ENDOSCOPY;  Service: Endoscopy;  Laterality: N/A;  Rm 1501. Pt to be discharged after procedure. Patient was only admitted for prep.    Social History   Socioeconomic History  . Marital status: Single    Spouse name: Not on file  . Number of children: 2  . Years of education: Not on file  . Highest education level: Not on file  Occupational History  . Occupation: RETIRED    Employer: RETIRED  Social Needs  . Financial resource strain: Not on file  . Food insecurity:    Worry: Not on file    Inability: Not on file  . Transportation needs:    Medical: Not on file    Non-medical: Not on file  Tobacco Use  . Smoking status: Never Smoker  . Smokeless tobacco: Never Used  Substance and Sexual Activity  . Alcohol use: No    Alcohol/week: 0.0 oz  . Drug use: No  . Sexual activity: Never  Lifestyle  . Physical activity:    Days per week: Not on file    Minutes per session: Not on file  . Stress: Not on file  Relationships  . Social connections:    Talks on phone: Not on file    Gets together: Not on file    Attends religious service: Not on file    Active  member of club or organization: Not on file    Attends meetings of clubs or organizations: Not on file    Relationship status: Not on file  Other Topics Concern  . Not on file  Social History Narrative   Lives alone in independent living, single. Never married   Supportive daughter [name] -    Family History  Problem  Relation Age of Onset  . Rheum arthritis Mother     Review of Systems  Constitutional: Negative for appetite change and fatigue.  Respiratory: Positive for shortness of breath (when she walks). Negative for cough and wheezing.   Cardiovascular: Positive for palpitations (anxiety related) and leg swelling (left ankle). Negative for chest pain.  Neurological: Negative for light-headedness and headaches.  Psychiatric/Behavioral: Positive for sleep disturbance (controlled). Negative for dysphoric mood. The patient is nervous/anxious (mild).        Objective:   Vitals:   02/10/18 1426 02/10/18 1501  BP: (!) 200/88 (!) 190/90  Pulse: 74   Resp: 16   Temp: 99 F (37.2 C)   SpO2: 96%    BP Readings from Last 3 Encounters:  02/10/18 (!) 190/90  04/04/17 (!) 144/76  02/21/17 (!) 160/80   Wt Readings from Last 3 Encounters:  02/10/18 115 lb (52.2 kg)  04/04/17 123 lb (55.8 kg)  02/21/17 122 lb 3.2 oz (55.4 kg)   Body mass index is 21.03 kg/m.   Physical Exam    Constitutional: Appears well-developed and well-nourished. No distress.  HENT:  Head: Normocephalic and atraumatic.  Neck: Neck supple. No tracheal deviation present. No thyromegaly present.  No cervical lymphadenopathy Cardiovascular: Normal rate, regular rhythm and normal heart sounds.   No murmur heard. No carotid bruit .  Trace left ankle nonpitting edema-chronic, no right lower extremity edema Pulmonary/Chest: Effort normal and breath sounds normal. No respiratory distress. No has no wheezes. No rales.  Skin: Skin is warm and dry. Not diaphoretic.  Psychiatric: Normal mood and affect. Behavior is  normal.      Assessment & Plan:    See Problem List for Assessment and Plan of chronic medical problems.

## 2018-02-10 ENCOUNTER — Other Ambulatory Visit (INDEPENDENT_AMBULATORY_CARE_PROVIDER_SITE_OTHER): Payer: Medicare Other

## 2018-02-10 ENCOUNTER — Encounter: Payer: Self-pay | Admitting: Internal Medicine

## 2018-02-10 ENCOUNTER — Ambulatory Visit (INDEPENDENT_AMBULATORY_CARE_PROVIDER_SITE_OTHER): Payer: Medicare Other | Admitting: Internal Medicine

## 2018-02-10 VITALS — BP 190/90 | HR 74 | Temp 99.0°F | Resp 16 | Wt 115.0 lb

## 2018-02-10 DIAGNOSIS — F418 Other specified anxiety disorders: Secondary | ICD-10-CM | POA: Diagnosis not present

## 2018-02-10 DIAGNOSIS — E7849 Other hyperlipidemia: Secondary | ICD-10-CM | POA: Diagnosis not present

## 2018-02-10 DIAGNOSIS — E119 Type 2 diabetes mellitus without complications: Secondary | ICD-10-CM

## 2018-02-10 DIAGNOSIS — I10 Essential (primary) hypertension: Secondary | ICD-10-CM | POA: Diagnosis not present

## 2018-02-10 DIAGNOSIS — F0150 Vascular dementia without behavioral disturbance: Secondary | ICD-10-CM | POA: Diagnosis not present

## 2018-02-10 LAB — CBC WITH DIFFERENTIAL/PLATELET
Basophils Absolute: 0 10*3/uL (ref 0.0–0.1)
Basophils Relative: 0.4 % (ref 0.0–3.0)
EOS PCT: 1.8 % (ref 0.0–5.0)
Eosinophils Absolute: 0.1 10*3/uL (ref 0.0–0.7)
HCT: 33.7 % — ABNORMAL LOW (ref 36.0–46.0)
HEMOGLOBIN: 11.1 g/dL — AB (ref 12.0–15.0)
LYMPHS ABS: 2.1 10*3/uL (ref 0.7–4.0)
Lymphocytes Relative: 38.8 % (ref 12.0–46.0)
MCHC: 33 g/dL (ref 30.0–36.0)
MCV: 95.5 fl (ref 78.0–100.0)
Monocytes Absolute: 0.4 10*3/uL (ref 0.1–1.0)
Monocytes Relative: 8.1 % (ref 3.0–12.0)
Neutro Abs: 2.8 10*3/uL (ref 1.4–7.7)
Neutrophils Relative %: 50.9 % (ref 43.0–77.0)
Platelets: 174 10*3/uL (ref 150.0–400.0)
RBC: 3.52 Mil/uL — AB (ref 3.87–5.11)
RDW: 13.2 % (ref 11.5–15.5)
WBC: 5.4 10*3/uL (ref 4.0–10.5)

## 2018-02-10 LAB — COMPREHENSIVE METABOLIC PANEL
ALT: 15 U/L (ref 0–35)
AST: 19 U/L (ref 0–37)
Albumin: 4.2 g/dL (ref 3.5–5.2)
Alkaline Phosphatase: 70 U/L (ref 39–117)
BUN: 19 mg/dL (ref 6–23)
CO2: 28 meq/L (ref 19–32)
Calcium: 9.3 mg/dL (ref 8.4–10.5)
Chloride: 103 mEq/L (ref 96–112)
Creatinine, Ser: 1.3 mg/dL — ABNORMAL HIGH (ref 0.40–1.20)
GFR: 49.48 mL/min — AB (ref >60.00)
GLUCOSE: 128 mg/dL — AB (ref 70–99)
POTASSIUM: 4.1 meq/L (ref 3.5–5.1)
Sodium: 140 mEq/L (ref 135–145)
Total Bilirubin: 0.3 mg/dL (ref 0.2–1.2)
Total Protein: 7.7 g/dL (ref 6.0–8.3)

## 2018-02-10 LAB — LIPID PANEL
CHOL/HDL RATIO: 2
Cholesterol: 182 mg/dL (ref 0–200)
HDL: 100.3 mg/dL (ref >39.00)
LDL Cholesterol: 70 mg/dL (ref 0–99)
NONHDL: 82.07
Triglycerides: 59 mg/dL (ref 0.0–149.0)
VLDL: 11.8 mg/dL (ref 0.0–40.0)

## 2018-02-10 LAB — HEMOGLOBIN A1C: Hgb A1c MFr Bld: 6.5 % (ref 4.6–6.5)

## 2018-02-10 MED ORDER — ZOLPIDEM TARTRATE 5 MG PO TABS: 5.0000 mg | ORAL_TABLET | Freq: Every evening | ORAL | 3 refills | Status: DC | PRN

## 2018-02-10 MED ORDER — DILTIAZEM HCL ER COATED BEADS 240 MG PO CP24: 240.0000 mg | ORAL_CAPSULE | Freq: Every day | ORAL | 5 refills | Status: DC

## 2018-02-10 NOTE — Assessment & Plan Note (Signed)
?    Controlled-she states her sugars have been low but higher and she was eating differently in [place] Will check A1c May need to adjust medication She is more compliant with a low sugar/carbohydrate diet here

## 2018-02-10 NOTE — Assessment & Plan Note (Signed)
Fairly controlled, but she is having some difficulty adjusting after living in [place] for 9 months-feeling slightly overwhelmed Continue Celexa at current dose and Remeron for now Will consider discontinuing Remeron at her next visit

## 2018-02-10 NOTE — Assessment & Plan Note (Signed)
Taking Aricept nightly We will continue

## 2018-02-10 NOTE — Assessment & Plan Note (Signed)
Blood pressure not ideally controlled She is nervous here today, but still it is extremely high Continue Benicar 20 mg daily Increase diltiazem to 240 mg daily Monitor at home-if not controlled will need to add another medication such as hydralazine

## 2018-02-10 NOTE — Assessment & Plan Note (Addendum)
Unsure if she is taking simvastatin or atorvastatin Continue daily statin Check CMP, lipid panel

## 2018-02-14 ENCOUNTER — Other Ambulatory Visit: Payer: Self-pay | Admitting: Internal Medicine

## 2018-02-17 ENCOUNTER — Encounter: Payer: Self-pay | Admitting: Podiatry

## 2018-02-17 ENCOUNTER — Ambulatory Visit: Payer: Medicare Other | Admitting: Podiatry

## 2018-02-17 DIAGNOSIS — M79672 Pain in left foot: Secondary | ICD-10-CM

## 2018-02-17 DIAGNOSIS — B351 Tinea unguium: Secondary | ICD-10-CM

## 2018-02-17 DIAGNOSIS — M79671 Pain in right foot: Secondary | ICD-10-CM

## 2018-02-17 NOTE — Progress Notes (Signed)
Subjective: 84 y.o. year old female patient presents requesting toe nails trimmed.   History of multiple falls since she was in last year and wears wrist brace on right.  Wears AFO on left foot and leg, and ambulating well using a cane.  Now she is living with her daughter.  Objective: Dermatologic: Thick yellow deformed nails x 10. Vascular: Pedal pulses are all palpable. Orthopedic: Contracted lesser digits  Neurologic: All epicritic and tactile sensations grossly intact.  Assessment: Dystrophic mycotic nails x 10. Diabetic under control.  Treatment: All mycotic nails debrided.  Return in 3 months or as needed.

## 2018-02-17 NOTE — Progress Notes (Addendum)
Subjective:   Patricia Andrews is a 84 y.o. female who presents for Medicare Annual/Subsequent preventive examination.  Review of Systems:  No ROS.  Medicare Wellness Visit. Additional risk factors are reflected in the social history.  Cardiac Risk Factors include: advanced age (>73mn, >>20women);diabetes mellitus;hypertension;dyslipidemia Sleep patterns: gets up 0 times nightly to void and sleeps 7-8 hours nightly.    Home Safety/Smoke Alarms: Feels safe in home. Smoke alarms in place.  Living environment; residence and Firearm Safety: 2-story house, can live on one level, equipment: Cane, Type: [place] wheelchair, tub bench, no firearms. Seat Belt Safety/Bike Helmet: Wears seat belt.     Objective:    Vitals: There were no vitals taken for this visit.  There is no height or weight on file to calculate BMI.  Advanced Directives 02/18/2018 01/01/2017 09/18/2016 07/17/2016 05/15/2015 06/05/2012  Does Patient Have a Medical Advance Directive? No;Yes Yes Yes Yes Yes Patient has advance directive, copy not in chart  Type of Advance Directive [place] - [place] will  Does patient want to make changes to medical advance directive? - - - - No - Patient declined -  Copy of [place]? - No - copy requested - No - copy requested No - copy requested Copy requested from family    Tobacco Social History   Tobacco Use  Smoking Status Never Smoker  Smokeless Tobacco Never Used     Counseling given: Not Answered   Past Medical History:  Diagnosis Date  . Allergic rhinitis   . Anemia   . Arthritis   . Depression    anxiety overlap  . Diabetes mellitus, type 2 ([place]   . Hyperlipidemia   . Hypertension   . Osteoporosis, senile    accidental fall w/ pelvic fx 2006, L wrist  fx 10/2013  . [place]   . Stroke ([facility] 1979   R MCA>>residual spastic L HP; TIA 06/2011   Past Surgical History:  Procedure Laterality Date  . ABDOMINAL HYSTERECTOMY  1960   Fibroid tumors  . COLONOSCOPY  06/06/2012   Procedure: COLONOSCOPY;  Surgeon: RInda Castle MD;  Location: [facility];  Service: Endoscopy;  Laterality: N/A;  Rm 1501. Pt to be discharged after procedure. Patient was only admitted for prep.   Family History  Problem Relation Age of Onset  . Rheum arthritis Mother    Social History   Socioeconomic History  . Marital status: Single    Spouse name: Not on file  . Number of children: 2  . Years of education: Not on file  . Highest education level: Not on file  Occupational History  . Occupation: RETIRED    Employer: RETIRED  Social Needs  . Financial resource strain: Not hard at all  . Food insecurity:    Worry: Never true    Inability: Never true  . Transportation needs:    Medical: No    Non-medical: No  Tobacco Use  . Smoking status: Never Smoker  . Smokeless tobacco: Never Used  Substance and Sexual Activity  . Alcohol use: No    Alcohol/week: 0.0 oz  . Drug use: No  . Sexual activity: Never  Lifestyle  . Physical activity:    Days per week: 0 days    Minutes per session: 0 min  . Stress: Not at all  Relationships  . Social connections:    Talks on phone: More than three times a week    Gets together: More than three times a week    Attends religious service: More than 4 times per year    Active member of club or organization: Yes    Attends meetings of clubs or organizations: More than 4 times per year    Relationship status: Widowed  Other Topics Concern  . Not on file  Social History Narrative   Lives alone in independent living, single. Never married   Supportive daughter [name] -    Outpatient Encounter Medications as of 02/18/2018  Medication Sig  . acetaminophen (TYLENOL) 500 MG tablet Take 500 mg by mouth  every 6 (six) hours as needed for mild pain.  Marland Kitchen atorvastatin (LIPITOR) 20 MG tablet TAKE 1 TABLET BY MOUTH  DAILY  . Blood Glucose Monitoring Suppl ([place]) w/Device KIT USE TO CHECK BLOOD SUGAR  TWO TIMES DAILY  . citalopram (CELEXA) 20 MG tablet TAKE 1 TABLET BY MOUTH  DAILY  . denosumab (PROLIA) 60 MG/ML SOLN injection Inject 60 mg into the skin every 6 (six) months. Administer in upper arm, thigh, or abdomen  Usually take on the 23rd of each month  . diltiazem (CARTIA XT) 240 MG 24 hr capsule Take 1 capsule (240 mg total) by mouth daily.  Marland Kitchen donepezil (ARICEPT) 10 MG tablet TAKE 1 TABLET BY MOUTH AT  BEDTIME  . hydroxypropyl methylcellulose / hypromellose (ISOPTO TEARS / GONIOVISC) 2.5 % ophthalmic solution Place 1 drop into both eyes as needed for dry eyes (dry eyes).  . insulin lispro (HUMALOG KWIKPEN) 100 UNIT/ML KiwkPen Inject into the skin 3 (three) times daily. (Per sliding Scale) 151-200 = 2 units, 201-250 = 4 units, 251-300 = 6 units 301-350 = 8 units, 351-400 = 10 units  . Insulin Pen Needle 32G X 4 MM MISC Use to administer insulin four times a day Dx E11.9  . LANTUS SOLOSTAR 100 UNIT/ML Solostar Pen INJECT SUBCUTANEOUSLY 10  UNITS DAILY  . mirtazapine (REMERON) 15 MG tablet TAKE 1 TABLET BY MOUTH AT  BEDTIME  . olmesartan (BENICAR) 20 MG tablet TAKE 1 TABLET BY MOUTH  DAILY  . Omega-3 Fatty Acids (FISH OIL PO) Take 1 capsule by mouth every morning.  Glory Rosebush DELICA LANCETS 73S MISC USE TO HELP CHECK BLOOD  SUGARS TWICE A DAY  . ONETOUCH VERIO test strip USE TO CHECK BLOOD SUGARS  TWICE A DAY  . vitamin B-12 (CYANOCOBALAMIN) 100 MCG tablet Take 100 mcg by mouth daily.  Marland Kitchen zolpidem (AMBIEN) 5 MG tablet Take 1 tablet (5 mg total) by mouth at bedtime as needed. For sleep.  . [DISCONTINUED] cholecalciferol (VITAMIN D) 1000 UNITS tablet Take 1,000 Units by mouth daily.   No facility-administered encounter medications on file as of 02/18/2018.     Activities of  Daily Living In your present state of health, do you have any difficulty performing the following activities: 02/18/2018  Hearing? Y  Vision? N  Difficulty concentrating or making decisions? Y  Walking or climbing stairs? Y  Dressing or bathing? N  Doing errands, shopping? Y  Preparing Food and eating ? Y  Using the Toilet? N  In the past six months, have you accidently leaked urine? Y  Do you have problems with loss of bowel control? N  Managing your Medications? Y  Managing your Finances? Y  Housekeeping or managing your Housekeeping? Y  Some recent data might  be hidden    Patient Care Team: Binnie Rail, MD as PCP - General (Internal Medicine) Clearnce Sorrel, MD (Neurology) Camelia Phenes, DPM (Podiatry) Inda Castle, MD (Inactive) (Gastroenterology) Binnie Rail, MD as Consulting Physician (Internal Medicine)   Assessment:   This is a routine wellness examination for [place]. Physical assessment deferred to PCP.   Exercise Activities and Dietary recommendations Current Exercise Habits: The patient does not participate in regular exercise at present, Exercise limited by: neurologic condition(s)(CVA L sided residual)  Diet (meal preparation, eat out, water intake, caffeinated beverages, dairy products, fruits and vegetables): in general, a "healthy" diet  , well balanced, patient states she has a good appetite. Encouraged patient to increase daily water and fluid intake.  Goals    . Patient Stated     I want to increase my strength by doing PT and doing chair exercises.       Fall Risk Fall Risk  02/18/2018 02/10/2018 04/07/2016 04/13/2015 09/08/2013  Falls in the past year? No No Yes Yes No  Number falls in past yr: - - 1 2 or more -  Injury with Fall? - - Yes Yes -  Comment - - - hit head after one of the falls.  -  Risk Factor Category  - - High Fall Risk - -  Risk for fall due to : Impaired mobility;Impaired balance/gait;History of fall(s) - History of  fall(s);Impaired balance/gait;Impaired mobility - -    Depression Screen PHQ 2/9 Scores 02/18/2018 02/10/2018 04/07/2016 04/13/2015  PHQ - 2 Score 3 1 0 0  PHQ- 9 Score 5 - - -    Cognitive Function MMSE - Mini Mental State Exam 02/18/2018  Orientation to time 5  Orientation to Place 5  Registration 3  Attention/ Calculation 5  Recall 3  Language- name 2 objects 2  Language- repeat 1  Language- follow 3 step command 3  Language- read & follow direction 1  Write a sentence 1  Copy design 0  Total score 29        Immunization History  Administered Date(s) Administered  . Influenza, High Dose Seasonal PF 07/11/2016  . Influenza, Seasonal, Injecte, Preservative Fre 08/12/2012  . Influenza,inj,Quad PF,6+ Mos 09/08/2013, 05/16/2015  . Influenza-Unspecified 05/27/2014  . Pneumococcal Conjugate-13 04/13/2015  . Pneumococcal Polysaccharide-23 09/20/2011  . Td 04/21/2013  . Zoster 04/14/2015   Screening Tests Health Maintenance  Topic Date Due  . OPHTHALMOLOGY EXAM  02/25/2016  . FOOT EXAM  04/12/2016  . INFLUENZA VACCINE  03/27/2018  . TETANUS/TDAP  04/22/2023  . DEXA SCAN  Completed  . PNA vac Low Risk Adult  Completed      Plan:     Continue doing brain stimulating activities (puzzles, reading, adult coloring books, staying active) to keep memory sharp.   Continue to eat heart healthy diet (full of fruits, vegetables, whole grains, lean protein, water--limit salt, fat, and sugar intake) and increase physical activity as tolerated.  I have personally reviewed and noted the following in the patient's chart:   . Medical and social history . Use of alcohol, tobacco or illicit drugs  . Current medications and supplements . Functional ability and status . Nutritional status . Physical activity . Advanced directives . List of other physicians . Vitals . Screenings to include cognitive, depression, and falls . Referrals and appointments  In addition, I have reviewed  and discussed with patient certain preventive protocols, quality metrics, and best practice recommendations. A written personalized care plan  for preventive services as well as general preventive health recommendations were provided to patient.     Michiel Cowboy, RN  02/18/2018    Medical screening examination/treatment/procedure(s) were performed by non-physician practitioner and as supervising physician I was immediately available for consultation/collaboration. I agree with above. Binnie Rail, MD

## 2018-02-17 NOTE — Patient Instructions (Signed)
Seen for hypertrophic nails. All nails debrided. Return in 3 months or as needed.  

## 2018-02-18 ENCOUNTER — Ambulatory Visit (INDEPENDENT_AMBULATORY_CARE_PROVIDER_SITE_OTHER): Payer: Medicare Other | Admitting: *Deleted

## 2018-02-18 ENCOUNTER — Telehealth: Payer: Self-pay | Admitting: *Deleted

## 2018-02-18 VITALS — BP 144/72 | HR 75 | Resp 18 | Ht 62.0 in | Wt 114.0 lb

## 2018-02-18 DIAGNOSIS — Z0000 Encounter for general adult medical examination without abnormal findings: Secondary | ICD-10-CM

## 2018-02-18 DIAGNOSIS — R2689 Other abnormalities of gait and mobility: Secondary | ICD-10-CM

## 2018-02-18 DIAGNOSIS — W19XXXD Unspecified fall, subsequent encounter: Secondary | ICD-10-CM

## 2018-02-18 DIAGNOSIS — I69359 Hemiplegia and hemiparesis following cerebral infarction affecting unspecified side: Secondary | ICD-10-CM

## 2018-02-18 NOTE — Patient Instructions (Addendum)
Continue doing brain stimulating activities (puzzles, reading, adult coloring books, staying active) to keep memory sharp.   Continue to eat heart healthy diet (full of fruits, vegetables, whole grains, lean protein, water--limit salt, fat, and sugar intake) and increase physical activity as tolerated.   Patricia Andrews , Thank you for taking time to come for your Medicare Wellness Visit. I appreciate your ongoing commitment to your health goals. Please review the following plan we discussed and let me know if I can assist you in the future.   These are the goals we discussed: Goals    . Patient Stated     I want to increase my strength by doing PT and doing chair exercises.       This is a list of the screening recommended for you and due dates:  Health Maintenance  Topic Date Due  . Eye exam for diabetics  02/25/2016  . Complete foot exam   04/12/2016  . Flu Shot  03/27/2018  . Tetanus Vaccine  04/22/2023  . DEXA scan (bone density measurement)  Completed  . Pneumonia vaccines  Completed   It is important to avoid accidents which may result in broken bones.  Here are a few ideas on how to make your home safer so you will be less likely to trip or fall.  1. Use nonskid mats or non slip strips in your shower or tub, on your bathroom floor and around sinks.  If you know that you have spilled water, wipe it up! 2. In the bathroom, it is important to have properly installed grab bars on the walls or on the edge of the tub.  Towel racks are NOT strong enough for you to hold onto or to pull on for support. 3. Stairs and hallways should have enough light.  Add lamps or night lights if you need ore light. 4. It is good to have handrails on both sides of the stairs if possible.  Always fix broken handrails right away. 5. It is important to see the edges of steps.  Paint the edges of outdoor steps white so you can see them better.  Put colored tape on the edge of inside steps. 6. Throw-rugs are  dangerous because they can slide.  Removing the rugs is the best idea, but if they must stay, add adhesive carpet tape to prevent slipping. 7. Do not keep things on stairs or in the halls.  Remove small furniture that blocks the halls as it may cause you to trip.  Keep telephone and electrical cords out of the way where you walk. 8. Always were sturdy, rubber-soled shoes for good support.  Never wear just socks, especially on the stairs.  Socks may cause you to slip or fall.  Do not wear full-length housecoats as you can easily trip on the bottom.  9. Place the things you use the most on the shelves that are the easiest to reach.  If you use a stepstool, make sure it is in good condition.  If you feel unsteady, DO NOT climb, ask for help. 10. If a health professional advises you to use a cane or walker, do not be ashamed.  These items can keep you from falling and breaking your bones.  Health Maintenance, Female Adopting a healthy lifestyle and getting preventive care can go a long way to promote health and wellness. Talk with your health care provider about what schedule of regular examinations is right for you. This is a good chance for  you to check in with your provider about disease prevention and staying healthy. In between checkups, there are plenty of things you can do on your own. Experts have done a lot of research about which lifestyle changes and preventive measures are most likely to keep you healthy. Ask your health care provider for more information. Weight and diet Eat a healthy diet  Be sure to include plenty of vegetables, fruits, low-fat dairy products, and lean protein.  Do not eat a lot of foods high in solid fats, added sugars, or salt.  Get regular exercise. This is one of the most important things you can do for your health. ? Most adults should exercise for at least 150 minutes each week. The exercise should increase your heart rate and make you sweat (moderate-intensity  exercise). ? Most adults should also do strengthening exercises at least twice a week. This is in addition to the moderate-intensity exercise.  Maintain a healthy weight  Body mass index (BMI) is a measurement that can be used to identify possible weight problems. It estimates body fat based on height and weight. Your health care provider can help determine your BMI and help you achieve or maintain a healthy weight.  For females 36 years of age and older: ? A BMI below 18.5 is considered underweight. ? A BMI of 18.5 to 24.9 is normal. ? A BMI of 25 to 29.9 is considered overweight. ? A BMI of 30 and above is considered obese.  Watch levels of cholesterol and blood lipids  You should start having your blood tested for lipids and cholesterol at 82 years of age, then have this test every 5 years.  You may need to have your cholesterol levels checked more often if: ? Your lipid or cholesterol levels are high. ? You are older than 82 years of age. ? You are at high risk for heart disease.  Cancer screening Lung Cancer  Lung cancer screening is recommended for adults 63-66 years old who are at high risk for lung cancer because of a history of smoking.  A yearly low-dose CT scan of the lungs is recommended for people who: ? Currently smoke. ? Have quit within the past 15 years. ? Have at least a 30-pack-year history of smoking. A pack year is smoking an average of one pack of cigarettes a day for 1 year.  Yearly screening should continue until it has been 15 years since you quit.  Yearly screening should stop if you develop a health problem that would prevent you from having lung cancer treatment.  Breast Cancer  Practice breast self-awareness. This means understanding how your breasts normally appear and feel.  It also means doing regular breast self-exams. Let your health care provider know about any changes, no matter how small.  If you are in your 20s or 30s, you should have a  clinical breast exam (CBE) by a health care provider every 1-3 years as part of a regular health exam.  If you are 53 or older, have a CBE every year. Also consider having a breast X-ray (mammogram) every year.  If you have a family history of breast cancer, talk to your health care provider about genetic screening.  If you are at high risk for breast cancer, talk to your health care provider about having an MRI and a mammogram every year.  Breast cancer gene (BRCA) assessment is recommended for women who have family members with BRCA-related cancers. BRCA-related cancers include: ? Breast. ? Ovarian. ?  Tubal. ? Peritoneal cancers.  Results of the assessment will determine the need for genetic counseling and BRCA1 and BRCA2 testing.  Cervical Cancer Your health care provider may recommend that you be screened regularly for cancer of the pelvic organs (ovaries, uterus, and vagina). This screening involves a pelvic examination, including checking for microscopic changes to the surface of your cervix (Pap test). You may be encouraged to have this screening done every 3 years, beginning at age 60.  For women ages 38-65, health care providers may recommend pelvic exams and Pap testing every 3 years, or they may recommend the Pap and pelvic exam, combined with testing for human papilloma virus (HPV), every 5 years. Some types of HPV increase your risk of cervical cancer. Testing for HPV may also be done on women of any age with unclear Pap test results.  Other health care providers may not recommend any screening for nonpregnant women who are considered low risk for pelvic cancer and who do not have symptoms. Ask your health care provider if a screening pelvic exam is right for you.  If you have had past treatment for cervical cancer or a condition that could lead to cancer, you need Pap tests and screening for cancer for at least 20 years after your treatment. If Pap tests have been discontinued,  your risk factors (such as having a new sexual partner) need to be reassessed to determine if screening should resume. Some women have medical problems that increase the chance of getting cervical cancer. In these cases, your health care provider may recommend more frequent screening and Pap tests.  Colorectal Cancer  This type of cancer can be detected and often prevented.  Routine colorectal cancer screening usually begins at 82 years of age and continues through 82 years of age.  Your health care provider may recommend screening at an earlier age if you have risk factors for colon cancer.  Your health care provider may also recommend using home test kits to check for hidden blood in the stool.  A small camera at the end of a tube can be used to examine your colon directly (sigmoidoscopy or colonoscopy). This is done to check for the earliest forms of colorectal cancer.  Routine screening usually begins at age 84.  Direct examination of the colon should be repeated every 5-10 years through 82 years of age. However, you may need to be screened more often if early forms of precancerous polyps or small growths are found.  Skin Cancer  Check your skin from head to toe regularly.  Tell your health care provider about any new moles or changes in moles, especially if there is a change in a mole's shape or color.  Also tell your health care provider if you have a mole that is larger than the size of a pencil eraser.  Always use sunscreen. Apply sunscreen liberally and repeatedly throughout the day.  Protect yourself by wearing long sleeves, pants, a wide-brimmed hat, and sunglasses whenever you are outside.  Heart disease, diabetes, and high blood pressure  High blood pressure causes heart disease and increases the risk of stroke. High blood pressure is more likely to develop in: ? People who have blood pressure in the high end of the normal range (130-139/85-89 mm Hg). ? People who are  overweight or obese. ? People who are African American.  If you are 61-16 years of age, have your blood pressure checked every 3-5 years. If you are 82 years of age or  older, have your blood pressure checked every year. You should have your blood pressure measured twice-once when you are at a hospital or clinic, and once when you are not at a hospital or clinic. Record the average of the two measurements. To check your blood pressure when you are not at a hospital or clinic, you can use: ? An automated blood pressure machine at a pharmacy. ? A home blood pressure monitor.  If you are between 60 years and 16 years old, ask your health care provider if you should take aspirin to prevent strokes.  Have regular diabetes screenings. This involves taking a blood sample to check your fasting blood sugar level. ? If you are at a normal weight and have a low risk for diabetes, have this test once every three years after 82 years of age. ? If you are overweight and have a high risk for diabetes, consider being tested at a younger age or more often. Preventing infection Hepatitis B  If you have a higher risk for hepatitis B, you should be screened for this virus. You are considered at high risk for hepatitis B if: ? You were born in a country where hepatitis B is common. Ask your health care provider which countries are considered high risk. ? Your parents were born in a high-risk country, and you have not been immunized against hepatitis B (hepatitis B vaccine). ? You have HIV or AIDS. ? You use needles to inject street drugs. ? You live with someone who has hepatitis B. ? You have had sex with someone who has hepatitis B. ? You get hemodialysis treatment. ? You take certain medicines for conditions, including cancer, organ transplantation, and autoimmune conditions.  Hepatitis C  Blood testing is recommended for: ? Everyone born from [birth year] through 1963. ? Anyone with known risk factors for  hepatitis C.  Sexually transmitted infections (STIs)  You should be screened for sexually transmitted infections (STIs) including gonorrhea and chlamydia if: ? You are sexually active and are younger than 82 years of age. ? You are older than 82 years of age and your health care provider tells you that you are at risk for this type of infection. ? Your sexual activity has changed since you were last screened and you are at an increased risk for chlamydia or gonorrhea. Ask your health care provider if you are at risk.  If you do not have HIV, but are at risk, it may be recommended that you take a prescription medicine daily to prevent HIV infection. This is called pre-exposure prophylaxis (PrEP). You are considered at risk if: ? You are sexually active and do not regularly use condoms or know the HIV status of your partner(s). ? You take drugs by injection. ? You are sexually active with a partner who has HIV.  Talk with your health care provider about whether you are at high risk of being infected with HIV. If you choose to begin PrEP, you should first be tested for HIV. You should then be tested every 3 months for as long as you are taking PrEP. Pregnancy  If you are premenopausal and you may become pregnant, ask your health care provider about preconception counseling.  If you may become pregnant, take 400 to 800 micrograms (mcg) of folic acid every day.  If you want to prevent pregnancy, talk to your health care provider about birth control (contraception). Osteoporosis and menopause  Osteoporosis is a disease in which the bones lose minerals and  strength with aging. This can result in serious bone fractures. Your risk for osteoporosis can be identified using a bone density scan.  If you are 89 years of age or older, or if you are at risk for osteoporosis and fractures, ask your health care provider if you should be screened.  Ask your health care provider whether you should take a  calcium or vitamin D supplement to lower your risk for osteoporosis.  Menopause may have certain physical symptoms and risks.  Hormone replacement therapy may reduce some of these symptoms and risks. Talk to your health care provider about whether hormone replacement therapy is right for you. Follow these instructions at home:  Schedule regular health, dental, and eye exams.  Stay current with your immunizations.  Do not use any tobacco products including cigarettes, chewing tobacco, or electronic cigarettes.  If you are pregnant, do not drink alcohol.  If you are breastfeeding, limit how much and how often you drink alcohol.  Limit alcohol intake to no more than 1 drink per day for nonpregnant women. One drink equals 12 ounces of beer, 5 ounces of wine, or 1 ounces of hard liquor.  Do not use street drugs.  Do not share needles.  Ask your health care provider for help if you need support or information about quitting drugs.  Tell your health care provider if you often feel depressed.  Tell your health care provider if you have ever been abused or do not feel safe at home. This information is not intended to replace advice given to you by your health care provider. Make sure you discuss any questions you have with your health care provider. Document Released: 02/26/2011 Document Revised: 01/19/2016 Document Reviewed: 05/17/2015 Elsevier Interactive Patient Education  [employer].

## 2018-02-18 NOTE — Telephone Encounter (Signed)
ordered

## 2018-02-18 NOTE — Telephone Encounter (Signed)
During AWV, patient and daughter stated that PCP asked her to stop taking vitamin D supplement and they want to know if should start to take the supplement again. Also, patient states she would [facility] to gain strength and balance and asked if PCP can order the therapy.

## 2018-02-27 ENCOUNTER — Other Ambulatory Visit: Payer: Self-pay | Admitting: Internal Medicine

## 2018-03-09 NOTE — Progress Notes (Signed)
Subjective:    Patient ID: Patricia Andrews, female    DOB: October 03, 1925, 84 y.o.   MRN: [id number]  HPI   Medications and allergies reviewed with patient and updated if appropriate.  Patient Active Problem List   Diagnosis Date Noted  . Sensorineural hearing loss 02/10/2017  . Dyspnea on exertion 12/31/2016  . Dementia 12/30/2016  . Hyperkalemia 07/24/2016  . Fall 07/17/2016  . Poor balance 07/11/2016  . Upper back pain 07/11/2016  . Chronic fatigue 01/16/2016  . Distal radius fracture   . Right wrist fracture 05/14/2015  . Onychomycosis 02/04/2013  . Pain due to onychomycosis of toenail 02/04/2013  . Diabetes type 2, controlled ([place]) 09/20/2011  . Hypertension 09/20/2011  . Hyperlipidemia 09/20/2011  . Senile osteoporosis 09/20/2011  . Depression with anxiety 09/20/2011  . Anemia 09/20/2011  . CVA, old, hemiparesis ([place])     Current Outpatient Medications on File Prior to Visit  Medication Sig Dispense Refill  . acetaminophen (TYLENOL) 500 MG tablet Take 500 mg by mouth every 6 (six) hours as needed for mild pain.    Marland Kitchen atorvastatin (LIPITOR) 20 MG tablet TAKE 1 TABLET BY MOUTH  DAILY 90 tablet 1  . Blood Glucose Monitoring Suppl (ONETOUCH VERIO FLEX SYSTEM) w/Device KIT USE TO CHECK BLOOD SUGAR  TWO TIMES DAILY 100 kit 3  . citalopram (CELEXA) 20 MG tablet TAKE 1 TABLET BY MOUTH  DAILY 90 tablet 1  . denosumab (PROLIA) 60 MG/ML SOLN injection Inject 60 mg into the skin every 6 (six) months. Administer in upper arm, thigh, or abdomen  Usually take on the 23rd of each month    . diltiazem (CARTIA XT) 240 MG 24 hr capsule Take 1 capsule (240 mg total) by mouth daily. 30 capsule 5  . donepezil (ARICEPT) 10 MG tablet TAKE 1 TABLET BY MOUTH AT  BEDTIME 90 tablet 1  . hydroxypropyl methylcellulose / hypromellose (ISOPTO TEARS / GONIOVISC) 2.5 % ophthalmic solution Place 1 drop into both eyes as needed for dry eyes (dry eyes).    . insulin lispro (HUMALOG KWIKPEN) 100 UNIT/ML  KiwkPen Inject into the skin 3 (three) times daily. (Per sliding Scale) 151-200 = 2 units, 201-250 = 4 units, 251-300 = 6 units 301-350 = 8 units, 351-400 = 10 units    . Insulin Pen Needle 32G X 4 MM MISC Use to administer insulin four times a day Dx E11.9 30 each 0  . LANTUS SOLOSTAR 100 UNIT/ML Solostar Pen INJECT SUBCUTANEOUSLY 10  UNITS DAILY 15 mL 1  . mirtazapine (REMERON) 15 MG tablet TAKE 1 TABLET BY MOUTH AT  BEDTIME 90 tablet 1  . olmesartan (BENICAR) 20 MG tablet TAKE 1 TABLET BY MOUTH  DAILY 90 tablet 3  . Omega-3 Fatty Acids (FISH OIL PO) Take 1 capsule by mouth every morning.    Glory Rosebush DELICA LANCETS 80D MISC USE TO HELP CHECK BLOOD  SUGARS TWICE A DAY 200 each 1  . ONETOUCH VERIO test strip CHECK BLOOD SUGAR TWO TIMES DAILY AS DIRECTED 200 each 3  . vitamin B-12 (CYANOCOBALAMIN) 100 MCG tablet Take 100 mcg by mouth daily.    Marland Kitchen zolpidem (AMBIEN) 5 MG tablet Take 1 tablet (5 mg total) by mouth at bedtime as needed. For sleep. 30 tablet 3   No current facility-administered medications on file prior to visit.     Past Medical History:  Diagnosis Date  . Allergic rhinitis   . Anemia   . Arthritis   .  Depression    anxiety overlap  . Diabetes mellitus, type 2 ([place])   . Hyperlipidemia   . Hypertension   . Osteoporosis, senile    accidental fall w/ pelvic fx 2006, L wrist fx 10/2013  . Pelvis fracture ([place])   . Stroke [facility]) 1979   R MCA>>residual spastic L HP; TIA 06/2011    Past Surgical History:  Procedure Laterality Date  . ABDOMINAL HYSTERECTOMY  1960   Fibroid tumors  . COLONOSCOPY  06/06/2012   Procedure: COLONOSCOPY;  Surgeon: Inda Castle, MD;  Location: [facility] ENDOSCOPY;  Service: Endoscopy;  Laterality: N/A;  Rm 1501. Pt to be discharged after procedure. Patient was only admitted for prep.    Social History   Socioeconomic History  . Marital status: Single    Spouse name: Not on file  . Number of children: 2  . Years of education: Not on file  .  Highest education level: Not on file  Occupational History  . Occupation: RETIRED    Employer: RETIRED  Social Needs  . Financial resource strain: Not hard at all  . Food insecurity:    Worry: Never true    Inability: Never true  . Transportation needs:    Medical: No    Non-medical: No  Tobacco Use  . Smoking status: Never Smoker  . Smokeless tobacco: Never Used  Substance and Sexual Activity  . Alcohol use: No    Alcohol/week: 0.0 oz  . Drug use: No  . Sexual activity: Never  Lifestyle  . Physical activity:    Days per week: 0 days    Minutes per session: 0 min  . Stress: Not at all  Relationships  . Social connections:    Talks on phone: More than three times a week    Gets together: More than three times a week    Attends religious service: More than 4 times per year    Active member of club or organization: Yes    Attends meetings of clubs or organizations: More than 4 times per year    Relationship status: Widowed  Other Topics Concern  . Not on file  Social History Narrative   Lives alone in independent living, single. Never married   Supportive daughter [name] -    Family History  Problem Relation Age of Onset  . Rheum arthritis Mother     Review of Systems     Objective:  There were no vitals filed for this visit. BP Readings from Last 3 Encounters:  02/18/18 (!) 144/72  02/10/18 (!) 190/90  04/04/17 (!) 144/76   Wt Readings from Last 3 Encounters:  02/18/18 114 lb (51.7 kg)  02/10/18 115 lb (52.2 kg)  04/04/17 123 lb (55.8 kg)   There is no height or weight on file to calculate BMI.   Physical Exam        Assessment & Plan:    See Problem List for Assessment and Plan of chronic medical problems.   This encounter was created in error - please disregard.

## 2018-03-10 ENCOUNTER — Encounter: Payer: Medicare Other | Admitting: Internal Medicine

## 2018-03-10 DIAGNOSIS — Z0289 Encounter for other administrative examinations: Secondary | ICD-10-CM

## 2018-04-16 ENCOUNTER — Other Ambulatory Visit: Payer: Self-pay | Admitting: Internal Medicine

## 2018-05-13 ENCOUNTER — Other Ambulatory Visit: Payer: Self-pay | Admitting: Internal Medicine

## 2018-07-28 ENCOUNTER — Other Ambulatory Visit: Payer: Self-pay | Admitting: Internal Medicine

## 2018-08-11 ENCOUNTER — Ambulatory Visit: Payer: Medicare Other | Admitting: Podiatry

## 2018-08-31 ENCOUNTER — Other Ambulatory Visit: Payer: Self-pay | Admitting: Internal Medicine

## 2018-09-10 DIAGNOSIS — G4700 Insomnia, unspecified: Secondary | ICD-10-CM | POA: Insufficient documentation

## 2018-09-10 NOTE — Progress Notes (Signed)
Subjective:    Patient ID: Patricia Andrews, female    DOB: 1926-07-29, 85 y.o.   MRN: [id number]  HPI The patient is here for follow up.  Hypertension, CKD: She is taking her medication daily. She is compliant with a low sodium diet.  She denies chest pain, and regular headaches. She is not exercising regularly.  She does not monitor her blood pressure at home, but has a cuff at home and can monitor her blood pressure.    Diabetes: She is taking her medication daily as prescribed. She is compliant with a diabetic diet. She is exercising regularly. She monitors her sugars and they have been running 159-180's in am. She is up-to-date with an ophthalmology examination.   Hyperlipidemia: She is taking her medication daily. She is compliant with a low fat/cholesterol diet. She is exercising regularly. She denies myalgias.    Depression, anxiety:  She is taking her medication daily.  She feels her depression and anxiety are prescribed.    Insomnia:  She takes [place] and her sleep is controlled.   [place]:  She is taking aricept 10 mg nightly.  Her daughter feels her short-term memory has gotten a little worse.  She wonders what else we can do.  She has no side effects with the Aricept.  Medications and allergies reviewed with patient and updated if appropriate.  Patient Active Problem List   Diagnosis Date Noted  . Insomnia 09/10/2018  . Sensorineural hearing loss 02/10/2017  . Dyspnea on exertion 12/31/2016  . Dementia ([place]) 12/30/2016  . Hyperkalemia 07/24/2016  . Poor balance 07/11/2016  . Upper back pain 07/11/2016  . Pain due to onychomycosis of toenail 02/04/2013  . Diabetes type 2, controlled ([place]) 09/20/2011  . Hypertension 09/20/2011  . Hyperlipidemia 09/20/2011  . Senile osteoporosis 09/20/2011  . Depression with anxiety 09/20/2011  . Anemia 09/20/2011  . CVA, old, hemiparesis ([place])     Current Outpatient Medications on File Prior to Visit  Medication Sig Dispense  Refill  . acetaminophen (TYLENOL) 500 MG tablet Take 500 mg by mouth every 6 (six) hours as needed for mild pain.    Marland Kitchen atorvastatin (LIPITOR) 20 MG tablet TAKE 1 TABLET BY MOUTH  DAILY 90 tablet 1  . Blood Glucose Monitoring Suppl (ONETOUCH VERIO FLEX SYSTEM) w/Device KIT USE TO CHECK BLOOD SUGAR  TWO TIMES DAILY 100 kit 3  . citalopram (CELEXA) 20 MG tablet TAKE 1 TABLET BY MOUTH  DAILY 90 tablet 1  . denosumab (PROLIA) 60 MG/ML SOLN injection Inject 60 mg into the skin every 6 (six) months. Administer in upper arm, thigh, or abdomen  Usually take on the 23rd of each month    . donepezil (ARICEPT) 10 MG tablet TAKE 1 TABLET BY MOUTH AT  BEDTIME 90 tablet 1  . hydroxypropyl methylcellulose / hypromellose (ISOPTO TEARS / GONIOVISC) 2.5 % ophthalmic solution Place 1 drop into both eyes as needed for dry eyes (dry eyes).    . insulin lispro (HUMALOG KWIKPEN) 100 UNIT/ML KiwkPen Inject into the skin 3 (three) times daily. (Per sliding Scale) 151-200 = 2 units, 201-250 = 4 units, 251-300 = 6 units 301-350 = 8 units, 351-400 = 10 units    . Insulin Pen Needle 32G X 4 MM MISC Use to administer insulin four times a day Dx E11.9 30 each 0  . Lancets (ONETOUCH DELICA PLUS [id number]) MISC USE WITH LANCING DEVICE TO  CHECK BLOOD SUGAR TWO TIMES DAILY AS DIRECTED 200 each 1  .  LANTUS SOLOSTAR 100 UNIT/ML Solostar Pen INJECT SUBCUTANEOUSLY 10  UNITS DAILY 15 mL 1  . mirtazapine (REMERON) 15 MG tablet TAKE 1 TABLET BY MOUTH AT  BEDTIME 90 tablet 1  . olmesartan (BENICAR) 20 MG tablet TAKE 1 TABLET BY MOUTH  DAILY 90 tablet 3  . Omega-3 Fatty Acids (FISH OIL PO) Take 1 capsule by mouth every morning.    Glory Rosebush VERIO test strip CHECK BLOOD SUGAR TWO TIMES DAILY AS DIRECTED 200 each 3  . vitamin B-12 (CYANOCOBALAMIN) 100 MCG tablet Take 100 mcg by mouth daily.    Marland Kitchen zolpidem (AMBIEN) 5 MG tablet Take 1 tablet (5 mg total) by mouth at bedtime as needed. For sleep. 30 tablet 3   No current facility-administered  medications on file prior to visit.     Past Medical History:  Diagnosis Date  . Allergic rhinitis   . Anemia   . Arthritis   . Depression    anxiety overlap  . Diabetes mellitus, type 2 ([place])   . Distal radius fracture   . Hyperlipidemia   . Hypertension   . Osteoporosis, senile    accidental fall w/ pelvic fx 2006, L wrist fx 10/2013  . Pelvis fracture ([place])   . Right wrist fracture 05/14/2015  . Stroke [facility]) 1979   R MCA>>residual spastic L HP; TIA 06/2011    Past Surgical History:  Procedure Laterality Date  . ABDOMINAL HYSTERECTOMY  1960   Fibroid tumors  . COLONOSCOPY  06/06/2012   Procedure: COLONOSCOPY;  Surgeon: Inda Castle, MD;  Location: [facility];  Service: Endoscopy;  Laterality: N/A;  Rm 1501. Pt to be discharged after procedure. Patient was only admitted for prep.    Social History   Socioeconomic History  . Marital status: Single    Spouse name: Not on file  . Number of children: 2  . Years of education: Not on file  . Highest education level: Not on file  Occupational History  . Occupation: RETIRED    Employer: RETIRED  Social Needs  . Financial resource strain: Not hard at all  . Food insecurity:    Worry: Never true    Inability: Never true  . Transportation needs:    Medical: No    Non-medical: No  Tobacco Use  . Smoking status: Never Smoker  . Smokeless tobacco: Never Used  Substance and Sexual Activity  . Alcohol use: No    Alcohol/week: 0.0 standard drinks  . Drug use: No  . Sexual activity: Never  Lifestyle  . Physical activity:    Days per week: 0 days    Minutes per session: 0 min  . Stress: Not at all  Relationships  . Social connections:    Talks on phone: More than three times a week    Gets together: More than three times a week    Attends religious service: More than 4 times per year    Active member of club or organization: Yes    Attends meetings of clubs or organizations: More than 4 times per year     Relationship status: Widowed  Other Topics Concern  . Not on file  Social History Narrative   Lives alone in independent living, single. Never married   Supportive daughter [name] -    Family History  Problem Relation Age of Onset  . Rheum arthritis Mother     Review of Systems  Constitutional: Negative for chills and fever.  HENT: Positive for postnasal drip.  Respiratory: Positive for shortness of breath (with exertion - chronic). Negative for cough and wheezing.   Cardiovascular: Positive for palpitations and leg swelling (left only if on it a lot). Negative for chest pain.  Gastrointestinal: Negative for abdominal pain and nausea.  Neurological: Negative for dizziness, light-headedness and headaches.  Psychiatric/Behavioral: Positive for dysphoric mood (sometimes).       Objective:   Vitals:   09/11/18 0948  BP: (!) 192/80  Pulse: 72  Resp: 14  Temp: 98.6 F (37 C)  SpO2: 99%   BP Readings from Last 3 Encounters:  09/11/18 (!) 192/80  02/18/18 (!) 144/72  02/10/18 (!) 190/90   Wt Readings from Last 3 Encounters:  09/11/18 122 lb 12.8 oz (55.7 kg)  02/18/18 114 lb (51.7 kg)  02/10/18 115 lb (52.2 kg)   Body mass index is 22.46 kg/m.   Physical Exam    Constitutional: Appears well-developed and well-nourished. No distress.  HENT:  Head: Normocephalic and atraumatic.  Neck: Neck supple. No tracheal deviation present. No thyromegaly present.  No cervical lymphadenopathy Cardiovascular: Normal rate, regular rhythm and normal heart sounds.   No murmur heard. No carotid bruit .  No edema Pulmonary/Chest: Effort normal and breath sounds normal. No respiratory distress. No has no wheezes. No rales.  Skin: Skin is warm and dry. Not diaphoretic.  Psychiatric: Normal mood and affect. Behavior is normal.      Assessment & Plan:    See Problem List for Assessment and Plan of chronic medical problems.

## 2018-09-10 NOTE — Patient Instructions (Addendum)
  Tests ordered today. Your results will be released to MyChart (or called to you) after review, usually within 72hours after test completion. If any changes need to be made, you will be notified at that same time.   Medications reviewed and updated.  Changes include :   Increase the diltiazem to 300 mg daily.  Start memantine (namenda) 5 mg twice daily.  If tolerated after the first month let me know and we can increase this to 10 mg twice daily.    Your prescription(s) have been submitted to your pharmacy. Please take as directed and contact our office if you believe you are having problem(s) with the medication(s).    Please followup in 6 months

## 2018-09-11 ENCOUNTER — Ambulatory Visit (INDEPENDENT_AMBULATORY_CARE_PROVIDER_SITE_OTHER): Payer: Medicare Other | Admitting: Internal Medicine

## 2018-09-11 ENCOUNTER — Other Ambulatory Visit (INDEPENDENT_AMBULATORY_CARE_PROVIDER_SITE_OTHER): Payer: Medicare Other

## 2018-09-11 ENCOUNTER — Encounter: Payer: Self-pay | Admitting: Internal Medicine

## 2018-09-11 VITALS — BP 192/80 | HR 72 | Temp 98.6°F | Resp 14 | Ht 62.0 in | Wt 122.8 lb

## 2018-09-11 DIAGNOSIS — E7849 Other hyperlipidemia: Secondary | ICD-10-CM | POA: Diagnosis not present

## 2018-09-11 DIAGNOSIS — F5101 Primary insomnia: Secondary | ICD-10-CM

## 2018-09-11 DIAGNOSIS — I10 Essential (primary) hypertension: Secondary | ICD-10-CM

## 2018-09-11 DIAGNOSIS — E119 Type 2 diabetes mellitus without complications: Secondary | ICD-10-CM | POA: Diagnosis not present

## 2018-09-11 DIAGNOSIS — F418 Other specified anxiety disorders: Secondary | ICD-10-CM | POA: Diagnosis not present

## 2018-09-11 DIAGNOSIS — F0150 Vascular dementia without behavioral disturbance: Secondary | ICD-10-CM

## 2018-09-11 LAB — HEMOGLOBIN A1C: Hgb A1c MFr Bld: 7 % — ABNORMAL HIGH (ref 4.6–6.5)

## 2018-09-11 LAB — COMPREHENSIVE METABOLIC PANEL
ALT: 14 U/L (ref 0–35)
AST: 18 U/L (ref 0–37)
Albumin: 4.2 g/dL (ref 3.5–5.2)
Alkaline Phosphatase: 69 U/L (ref 39–117)
BUN: 18 mg/dL (ref 6–23)
CO2: 29 mEq/L (ref 19–32)
Calcium: 9.3 mg/dL (ref 8.4–10.5)
Chloride: 104 mEq/L (ref 96–112)
Creatinine, Ser: 1.26 mg/dL — ABNORMAL HIGH (ref 0.40–1.20)
GFR: 48.2 mL/min — AB (ref >60.00)
Glucose, Bld: 122 mg/dL — ABNORMAL HIGH (ref 70–99)
POTASSIUM: 4.5 meq/L (ref 3.5–5.1)
Sodium: 138 mEq/L (ref 135–145)
Total Bilirubin: 0.4 mg/dL (ref 0.2–1.2)
Total Protein: 7.5 g/dL (ref 6.0–8.3)

## 2018-09-11 LAB — CBC WITH DIFFERENTIAL/PLATELET
BASOS ABS: 0 10*3/uL (ref 0.0–0.1)
Basophils Relative: 0.4 % (ref 0.0–3.0)
Eosinophils Absolute: 0.1 10*3/uL (ref 0.0–0.7)
Eosinophils Relative: 2.4 % (ref 0.0–5.0)
HCT: 33.3 % — ABNORMAL LOW (ref 36.0–46.0)
Hemoglobin: 10.9 g/dL — ABNORMAL LOW (ref 12.0–15.0)
Lymphocytes Relative: 43.4 % (ref 12.0–46.0)
Lymphs Abs: 2.2 10*3/uL (ref 0.7–4.0)
MCHC: 32.8 g/dL (ref 30.0–36.0)
MCV: 96.3 fl (ref 78.0–100.0)
MONOS PCT: 8 % (ref 3.0–12.0)
Monocytes Absolute: 0.4 10*3/uL (ref 0.1–1.0)
Neutro Abs: 2.4 10*3/uL (ref 1.4–7.7)
Neutrophils Relative %: 45.8 % (ref 43.0–77.0)
Platelets: 168 10*3/uL (ref 150.0–400.0)
RBC: 3.46 Mil/uL — ABNORMAL LOW (ref 3.87–5.11)
RDW: 13.7 % (ref 11.5–15.5)
WBC: 5.2 10*3/uL (ref 4.0–10.5)

## 2018-09-11 LAB — LIPID PANEL
CHOL/HDL RATIO: 2
Cholesterol: 169 mg/dL (ref 0–200)
HDL: 93 mg/dL (ref >39.00)
LDL Cholesterol: 64 mg/dL (ref 0–99)
NONHDL: 75.59
Triglycerides: 57 mg/dL (ref 0.0–149.0)
VLDL: 11.4 mg/dL (ref 0.0–40.0)

## 2018-09-11 LAB — TSH: TSH: 3.12 u[IU]/mL (ref 0.35–4.50)

## 2018-09-11 MED ORDER — DILTIAZEM HCL ER COATED BEADS 300 MG PO CP24: 300.0000 mg | ORAL_CAPSULE | Freq: Every day | ORAL | 1 refills | Status: DC

## 2018-09-11 MED ORDER — MEMANTINE HCL 5 MG PO TABS: 5.0000 mg | ORAL_TABLET | Freq: Two times a day (BID) | ORAL | 0 refills | Status: DC

## 2018-09-11 MED ORDER — FREESTYLE LIBRE SENSOR SYSTEM MISC: 0 refills | Status: DC

## 2018-09-11 NOTE — Assessment & Plan Note (Signed)
Not controlled Advised her and her daughter to start monitoring her blood pressure at home regularly Low-sodium diet Will increase diltiazem to 300 mg daily Continue Benicar 20 mg daily CMP

## 2018-09-11 NOTE — Assessment & Plan Note (Signed)
Sugars slightly high at home Check A1c May need to adjust medication Low sugar/carbohydrate diet

## 2018-09-11 NOTE — Assessment & Plan Note (Signed)
Slight worsening of short-term memory Continue Aricept 10 mg daily Start Namenda 5 mg twice daily-if tolerated with her next fill will increase to 10 mg twice daily

## 2018-09-11 NOTE — Assessment & Plan Note (Signed)
Check lipid panel  Continue daily statin  

## 2018-09-11 NOTE — Assessment & Plan Note (Signed)
Controlled, stable Continue current dose of medication -Patricia Andrews

## 2018-09-11 NOTE — Assessment & Plan Note (Signed)
Controlled, stable Continue current dose of medication  

## 2018-09-25 ENCOUNTER — Telehealth: Payer: Self-pay

## 2018-09-25 ENCOUNTER — Other Ambulatory Visit: Payer: Self-pay

## 2018-09-25 DIAGNOSIS — I69359 Hemiplegia and hemiparesis following cerebral infarction affecting unspecified side: Secondary | ICD-10-CM

## 2018-09-25 NOTE — Telephone Encounter (Signed)
Copied from CRM #[id number]. Topic: General - Other >> Sep 25, 2018  9:10 AM Elliot Gault wrote: Jethro Bolus name: Evette Georges   Relation to pt: daughter  Call back number: [phone]   Reason for call:  Daughter apologizes for the last minute request, requesting  AFO (ankle foot brace) orders please fax to [employer]  fax# [phone]. Patient has an appointment with [facility] tomorrow and order is needed. Daughter states medicare covers 1 every 5 years and its time for a new order, please advise daughter when order is fax

## 2018-09-25 NOTE — Telephone Encounter (Signed)
Rx for AFO for left side has been faxed over to [facility]. Daughter is aware.

## 2018-09-28 ENCOUNTER — Other Ambulatory Visit: Payer: Self-pay | Admitting: Internal Medicine

## 2018-09-29 ENCOUNTER — Telehealth: Payer: Self-pay | Admitting: Internal Medicine

## 2018-09-29 MED ORDER — FREESTYLE LIBRE SENSOR SYSTEM MISC: 1 refills | Status: DC

## 2018-09-29 NOTE — Telephone Encounter (Signed)
Copied from CRM [id number]. Topic: Quick Communication - Rx Refill/Question >> Sep 29, 2018 12:23 PM Lyn Hollingshead, [facility] wrote: Medication:  Continuous Blood Gluc Sensor (FREESTYLE LIBRE SENSOR SYSTEM) MISC  Pt's daughter states pt was given a 14 day trial and loved it, she would like this called in to [facility] for pt.  Pt's daughter states that they would like this sent to [facility] and have them told to overnight - states they will pay for additional postage since pt is out.  Has the patient contacted their pharmacy? No - new script (Agent: If no, request that the patient contact the pharmacy for the refill.) (Agent: If yes, when and what did the pharmacy advise?)  Preferred Pharmacy (with phone number or street name): [facility] - [place],  - [address] [phone] (Phone) [phone] (Fax)  Agent: Please be advised that RX refills may take up to 3 business days. We ask that you follow-up with your pharmacy.

## 2018-09-29 NOTE — Telephone Encounter (Signed)
Requested Prescriptions  Pending Prescriptions Disp Refills  . Continuous Blood Gluc Sensor (FREESTYLE LIBRE SENSOR SYSTEM) MISC 9 each 1    Sig: Use as directed to check sugars.  Dx  E11.9 with hyperglycemia     Endocrinology: Diabetes - Testing Supplies Passed - 09/29/2018 12:36 PM      Passed - Valid encounter within last 12 months    Recent Outpatient Visits          2 weeks ago Essential hypertension   [facility] S[facility], Bobette Mo, MD   6 months ago Essential hypertension   [facility] [facility], Bobette Mo, MD   7 months ago Essential hypertension   [facility] S[facility], Bobette Mo, MD   1 year ago Controlled type 2 diabetes mellitus without complication, without long-term current use of insulin (HCC)   [facility] S[facility], Bobette Mo, MD   1 year ago Controlled type 2 diabetes mellitus without complication, without long-term current use of insulin (HCC)   [facility] S[facility], Bobette Mo, MD      Future Appointments            In 5 months Burns, Bobette Mo, MD [facility], [facility]

## 2018-10-02 NOTE — Telephone Encounter (Signed)
Patient is requesting the [facility] to be [facility] [address] [place], [place] [id number] [phone]

## 2018-10-03 MED ORDER — FREESTYLE LIBRE SENSOR SYSTEM MISC: 3 refills | Status: DC

## 2018-10-03 NOTE — Addendum Note (Signed)
Addended by: Mercer Pod E on: 10/03/2018 09:54 AM   Modules accepted: Orders

## 2018-10-03 NOTE — Telephone Encounter (Signed)
Spoke with daughter. Send freestyle libre to [facility] as daughter requested. Asked daughter to call me back and let me know if they has any problems getting it.

## 2018-10-13 ENCOUNTER — Telehealth: Payer: Self-pay

## 2018-10-13 NOTE — Telephone Encounter (Signed)
I have just received this order. Has been signed and faxed back.

## 2018-10-13 NOTE — Telephone Encounter (Signed)
Copied from CRM [id number]. Topic: General - Other >> Oct 09, 2018  3:57 PM Jilda Roche wrote: Reason for CRM: Annabelle Harman from [facility] calling to see if the fax was received for orders, it was faxed on Monday please advise  Best call back is [phone] ext [phone]

## 2018-10-21 NOTE — Telephone Encounter (Signed)
Caller name: Beth  Relation to pt: [employer] back number: [phone] ext [phone] fax # [phone]  Pharmacy:  Reason for call:  Requesting last O.V note, please fax.

## 2018-10-21 NOTE — Telephone Encounter (Signed)
Recent OV notes faxed.

## 2018-11-06 ENCOUNTER — Telehealth: Payer: Self-pay | Admitting: Internal Medicine

## 2018-11-06 NOTE — Telephone Encounter (Signed)
Copied from CRM [id number]. Topic: Quick Communication - See Telephone Encounter >> Nov 06, 2018  4:26 PM Jens Som A wrote: CRM for notification. See Telephone encounter for: 11/06/18.   Judeth Cornfield [employer] is calling will be faxing a form for her leg brace. Please advise [phone]

## 2018-11-07 NOTE — Telephone Encounter (Signed)
LVM for pt letting her know that form has been faxed over to [employer].

## 2018-11-07 NOTE — Telephone Encounter (Signed)
Received. Will sign and fax back.

## 2018-11-07 NOTE — Telephone Encounter (Signed)
Pt calling to f/u on this stating they have been working on this for over a month. She is using the [employer] on [address]. They are needing the form to be returned in order to start the work for the pt. It will take 3 weeks to complete. Please call pt with status update.

## 2018-11-12 ENCOUNTER — Other Ambulatory Visit: Payer: Self-pay | Admitting: Internal Medicine

## 2018-11-30 ENCOUNTER — Other Ambulatory Visit: Payer: Self-pay | Admitting: Internal Medicine

## 2019-01-20 ENCOUNTER — Other Ambulatory Visit: Payer: Self-pay | Admitting: Internal Medicine

## 2019-02-24 ENCOUNTER — Other Ambulatory Visit: Payer: Self-pay | Admitting: Internal Medicine

## 2019-03-08 NOTE — Progress Notes (Deleted)
Subjective:    Patient ID: Patricia Andrews, female    DOB: Jul 09, 1926, 85 y.o.   MRN: [id number]  HPI The patient is here for follow up.  CKD, Hypertension: She is taking her medication daily. She is compliant with a low sodium diet.  She denies chest pain, palpitations, edema, shortness of breath and regular headaches. She does not monitor her blood pressure at home.    Diabetes: She is taking her medication daily as prescribed. She is compliant with a diabetic diet.  She monitors her sugars and they have been running XXX. She denies numbness/tingling in her feet and foot lesions. She is up-to-date with an ophthalmology examination.   Hyperlipidemia: She is taking her medication daily. She is compliant with a low fat/cholesterol diet. She denies myalgias.   Depression, Anxiety: She is taking her medication daily as prescribed. She denies any side effects from the medication. She feels her anxiety and depression are well controlled and she is happy with her current dose of medication.   Insomnia:  She takes ambien nightly and has been on it for years.   Dementia:  She takes aricept  And namenda nightly.    Medications and allergies reviewed with patient and updated if appropriate.  Patient Active Problem List   Diagnosis Date Noted  . Insomnia 09/10/2018  . Sensorineural hearing loss 02/10/2017  . Dyspnea on exertion 12/31/2016  . Dementia ([place]) 12/30/2016  . Hyperkalemia 07/24/2016  . Poor balance 07/11/2016  . Upper back pain 07/11/2016  . Pain due to onychomycosis of toenail 02/04/2013  . Diabetes type 2, controlled ([place]) 09/20/2011  . Hypertension 09/20/2011  . Hyperlipidemia 09/20/2011  . Senile osteoporosis 09/20/2011  . Depression with anxiety 09/20/2011  . Anemia 09/20/2011  . CVA, old, hemiparesis ([place])     Current Outpatient Medications on File Prior to Visit  Medication Sig Dispense Refill  . acetaminophen (TYLENOL) 500 MG tablet Take 500 mg by mouth every 6  (six) hours as needed for mild pain.    Marland Kitchen atorvastatin (LIPITOR) 20 MG tablet TAKE 1 TABLET BY MOUTH  DAILY 90 tablet 1  . Blood Glucose Monitoring Suppl (ONETOUCH VERIO FLEX SYSTEM) w/Device KIT USE TO CHECK BLOOD SUGAR  TWO TIMES DAILY 100 kit 3  . citalopram (CELEXA) 20 MG tablet TAKE 1 TABLET BY MOUTH  DAILY 90 tablet 1  . Continuous Blood Gluc Sensor ([place]) MISC Use as directed to check sugars.  Dx  E11.9 with hyperglycemia 7 each 3  . denosumab (PROLIA) 60 MG/ML SOLN injection Inject 60 mg into the skin every 6 (six) months. Administer in upper arm, thigh, or abdomen  Usually take on the 23rd of each month    . diltiazem (CARDIZEM CD) 300 MG 24 hr capsule TAKE 1 CAPSULE BY MOUTH  DAILY 90 capsule 1  . donepezil (ARICEPT) 10 MG tablet TAKE 1 TABLET BY MOUTH AT  BEDTIME 90 tablet 1  . hydroxypropyl methylcellulose / hypromellose (ISOPTO TEARS / GONIOVISC) 2.5 % ophthalmic solution Place 1 drop into both eyes as needed for dry eyes (dry eyes).    . insulin lispro (HUMALOG KWIKPEN) 100 UNIT/ML KiwkPen Inject into the skin 3 (three) times daily. (Per sliding Scale) 151-200 = 2 units, 201-250 = 4 units, 251-300 = 6 units 301-350 = 8 units, 351-400 = 10 units    . Insulin Pen Needle 32G X 4 MM MISC Use to administer insulin four times a day Dx E11.9 30 each 0  .  Lancets (ONETOUCH DELICA PLUS [id number]) MISC USE WITH LANCING DEVICE TO  CHECK BLOOD SUGAR TWO TIMES DAILY AS DIRECTED 200 each 1  . LANTUS SOLOSTAR 100 UNIT/ML Solostar Pen INJECT SUBCUTANEOUSLY 10  UNITS DAILY 15 mL 1  . memantine (NAMENDA) 5 MG tablet TAKE 1 TABLET BY MOUTH TWO  TIMES DAILY 180 tablet 1  . mirtazapine (REMERON) 15 MG tablet TAKE 1 TABLET BY MOUTH AT  BEDTIME 90 tablet 1  . olmesartan (BENICAR) 20 MG tablet TAKE 1 TABLET BY MOUTH  DAILY 90 tablet 3  . Omega-3 Fatty Acids (FISH OIL PO) Take 1 capsule by mouth every morning.    Glory Rosebush VERIO test strip CHECK BLOOD SUGAR TWO TIMES DAILY AS  DIRECTED 200 strip 3  . vitamin B-12 (CYANOCOBALAMIN) 100 MCG tablet Take 100 mcg by mouth daily.    Marland Kitchen zolpidem (AMBIEN) 5 MG tablet Take 1 tablet (5 mg total) by mouth at bedtime as needed. For sleep. 30 tablet 3   No current facility-administered medications on file prior to visit.     Past Medical History:  Diagnosis Date  . Allergic rhinitis   . Anemia   . Arthritis   . Depression    anxiety overlap  . Diabetes mellitus, type 2 ([place])   . Distal radius fracture   . Hyperlipidemia   . Hypertension   . Osteoporosis, senile    accidental fall w/ pelvic fx 2006, L wrist fx 10/2013  . Pelvis fracture ([place])   . Right wrist fracture 05/14/2015  . Stroke [facility]) 1979   R MCA>>residual spastic L HP; TIA 06/2011    Past Surgical History:  Procedure Laterality Date  . ABDOMINAL HYSTERECTOMY  1960   Fibroid tumors  . COLONOSCOPY  06/06/2012   Procedure: COLONOSCOPY;  Surgeon: Inda Castle, MD;  Location: [facility];  Service: Endoscopy;  Laterality: N/A;  Rm 1501. Pt to be discharged after procedure. Patient was only admitted for prep.    Social History   Socioeconomic History  . Marital status: Single    Spouse name: Not on file  . Number of children: 2  . Years of education: Not on file  . Highest education level: Not on file  Occupational History  . Occupation: RETIRED    Employer: RETIRED  Social Needs  . Financial resource strain: Not hard at all  . Food insecurity    Worry: Never true    Inability: Never true  . Transportation needs    Medical: No    Non-medical: No  Tobacco Use  . Smoking status: Never Smoker  . Smokeless tobacco: Never Used  Substance and Sexual Activity  . Alcohol use: No    Alcohol/week: 0.0 standard drinks  . Drug use: No  . Sexual activity: Never  Lifestyle  . Physical activity    Days per week: 0 days    Minutes per session: 0 min  . Stress: Not at all  Relationships  . Social connections    Talks on phone: More than three  times a week    Gets together: More than three times a week    Attends religious service: More than 4 times per year    Active member of club or organization: Yes    Attends meetings of clubs or organizations: More than 4 times per year    Relationship status: Widowed  Other Topics Concern  . Not on file  Social History Narrative   Lives alone in independent living, single. Never married  Supportive daughter [name] -    Family History  Problem Relation Age of Onset  . Rheum arthritis Mother     Review of Systems     Objective:  There were no vitals filed for this visit. BP Readings from Last 3 Encounters:  09/11/18 (!) 192/80  02/18/18 (!) 144/72  02/10/18 (!) 190/90   Wt Readings from Last 3 Encounters:  09/11/18 122 lb 12.8 oz (55.7 kg)  02/18/18 114 lb (51.7 kg)  02/10/18 115 lb (52.2 kg)   There is no height or weight on file to calculate BMI.   Physical Exam    Constitutional: Appears well-developed and well-nourished. No distress.  HENT:  Head: Normocephalic and atraumatic.  Neck: Neck supple. No tracheal deviation present. No thyromegaly present.  No cervical lymphadenopathy Cardiovascular: Normal rate, regular rhythm and normal heart sounds.   No murmur heard. No carotid bruit .  No edema Pulmonary/Chest: Effort normal and breath sounds normal. No respiratory distress. No has no wheezes. No rales.  Skin: Skin is warm and dry. Not diaphoretic.  Psychiatric: Normal mood and affect. Behavior is normal.      Assessment & Plan:    See Problem List for Assessment and Plan of chronic medical problems.

## 2019-03-09 ENCOUNTER — Ambulatory Visit: Payer: Self-pay | Admitting: Internal Medicine

## 2019-04-22 ENCOUNTER — Other Ambulatory Visit: Payer: Self-pay | Admitting: Internal Medicine

## 2019-05-11 ENCOUNTER — Telehealth: Payer: Self-pay | Admitting: Emergency Medicine

## 2019-05-11 NOTE — Telephone Encounter (Signed)
Pts daughter [name] dropped off FL2 paperwork to be filled out. Put in Brittany's box. Daughter would like a call back when its ready for pick up. Thanks

## 2019-06-08 NOTE — Progress Notes (Signed)
Subjective:    Patient ID: Patricia Andrews, female    DOB: January 29, 1926, 85 y.o.   MRN: [id number]  HPI The patient is here for follow up.  FL2 to be completed today for her moving into [facility].   March 30th she fell and went to the hospital in [place].  She had a pelvic fracture and was in rehab.   She had some PT but is still weak and her balance is not as good,  She is using a 4 prong cane.    CKD, h/o CVA, Hypertension: She is taking her medication daily. She is compliant with a low sodium diet.  She denies chest pain, palpitations, edema, shortness of breath and regular headaches. She does monitor her blood pressure at home and it is good at home.    Diabetes: She is taking her medication daily as prescribed. Her basal insulin works well and she rarely needs the sliding scale - she only needs that is she cheats.  She is compliant with a diabetic diet.    Hyperlipidemia: She is taking her medication daily. She is compliant with a low fat/cholesterol diet. She denies myalgias.   Depression, Anxiety: She is taking her medication daily as prescribed. She denies any side effects from the medication. She feels her anxiety is well controlled and she is happy with her current dose of medication. She denies feeling depression.  She denies anxiety.    Insomnia:  She is taking ambien at night for sleep.  She has been on this for years and can not sleep w/o it. She is also taking Remeron at night.  Dementia:  She is taking namenda and aricept nightly.  There are no behavioral issues.  Medications and allergies reviewed with patient and updated if appropriate.  Patient Active Problem List   Diagnosis Date Noted  . Insomnia 09/10/2018  . Sensorineural hearing loss 02/10/2017  . Dyspnea on exertion 12/31/2016  . Dementia ([place]) 12/30/2016  . Hyperkalemia 07/24/2016  . Poor balance 07/11/2016  . Upper back pain 07/11/2016  . Pain due to onychomycosis of toenail 02/04/2013  . Diabetes type 2,  controlled ([place]) 09/20/2011  . Hypertension 09/20/2011  . Hyperlipidemia 09/20/2011  . Senile osteoporosis 09/20/2011  . Depression with anxiety 09/20/2011  . Anemia 09/20/2011  . CVA, old, hemiparesis ([place])     Current Outpatient Medications on File Prior to Visit  Medication Sig Dispense Refill  . acetaminophen (TYLENOL) 500 MG tablet Take 500 mg by mouth every 6 (six) hours as needed for mild pain.    Marland Kitchen atorvastatin (LIPITOR) 20 MG tablet TAKE 1 TABLET BY MOUTH  DAILY 90 tablet 1  . Blood Glucose Monitoring Suppl (ONETOUCH VERIO FLEX SYSTEM) w/Device KIT USE TO CHECK BLOOD SUGAR  TWO TIMES DAILY 100 kit 3  . citalopram (CELEXA) 20 MG tablet TAKE 1 TABLET BY MOUTH  DAILY 90 tablet 1  . Continuous Blood Gluc Sensor (Colon) MISC Use as directed to check sugars.  Dx  E11.9 with hyperglycemia 7 each 3  . denosumab (PROLIA) 60 MG/ML SOLN injection Inject 60 mg into the skin every 6 (six) months. Administer in upper arm, thigh, or abdomen  Usually take on the 23rd of each month    . diltiazem (CARDIZEM CD) 300 MG 24 hr capsule TAKE 1 CAPSULE BY MOUTH  DAILY 90 capsule 1  . donepezil (ARICEPT) 10 MG tablet TAKE 1 TABLET BY MOUTH AT  BEDTIME 90 tablet 1  . hydroxypropyl  methylcellulose / hypromellose (ISOPTO TEARS / GONIOVISC) 2.5 % ophthalmic solution Place 1 drop into both eyes as needed for dry eyes (dry eyes).    . insulin lispro (HUMALOG KWIKPEN) 100 UNIT/ML KiwkPen Inject into the skin 3 (three) times daily. (Per sliding Scale) 151-200 = 2 units, 201-250 = 4 units, 251-300 = 6 units 301-350 = 8 units, 351-400 = 10 units    . Insulin Pen Needle 32G X 4 MM MISC Use to administer insulin four times a day Dx E11.9 30 each 0  . Lancets (ONETOUCH DELICA PLUS [id number]) MISC USE WITH LANCING DEVICE TO  CHECK BLOOD SUGAR TWO TIMES DAILY AS DIRECTED 200 each 1  . LANTUS SOLOSTAR 100 UNIT/ML Solostar Pen INJECT SUBCUTANEOUSLY 10  UNITS DAILY 15 mL 1  . memantine (NAMENDA) 5  MG tablet Take 1 tablet (5 mg total) by mouth 2 (two) times daily. Need office visits for more refills 180 tablet 0  . mirtazapine (REMERON) 15 MG tablet TAKE 1 TABLET BY MOUTH AT  BEDTIME 90 tablet 1  . olmesartan (BENICAR) 20 MG tablet TAKE 1 TABLET BY MOUTH  DAILY 90 tablet 3  . Omega-3 Fatty Acids (FISH OIL PO) Take 1 capsule by mouth every morning.    Glory Rosebush VERIO test strip CHECK BLOOD SUGAR TWO TIMES DAILY AS DIRECTED 200 strip 3  . vitamin B-12 (CYANOCOBALAMIN) 100 MCG tablet Take 100 mcg by mouth daily.    Marland Kitchen zolpidem (AMBIEN) 5 MG tablet Take 1 tablet (5 mg total) by mouth at bedtime as needed. For sleep. 30 tablet 3   No current facility-administered medications on file prior to visit.     Past Medical History:  Diagnosis Date  . Allergic rhinitis   . Anemia   . Arthritis   . Depression    anxiety overlap  . Diabetes mellitus, type 2 ([place])   . Distal radius fracture   . Hyperlipidemia   . Hypertension   . Osteoporosis, senile    accidental fall w/ pelvic fx 2006, L wrist fx 10/2013  . Pelvis fracture ([place])   . Right wrist fracture 05/14/2015  . Stroke [facility]) 1979   R MCA>>residual spastic L HP; TIA 06/2011    Past Surgical History:  Procedure Laterality Date  . ABDOMINAL HYSTERECTOMY  1960   Fibroid tumors  . COLONOSCOPY  06/06/2012   Procedure: COLONOSCOPY;  Surgeon: Inda Castle, MD;  Location: [facility] ENDOSCOPY;  Service: Endoscopy;  Laterality: N/A;  Rm 1501. Pt to be discharged after procedure. Patient was only admitted for prep.    Social History   Socioeconomic History  . Marital status: Single    Spouse name: Not on file  . Number of children: 2  . Years of education: Not on file  . Highest education level: Not on file  Occupational History  . Occupation: RETIRED    Employer: RETIRED  Social Needs  . Financial resource strain: Not hard at all  . Food insecurity    Worry: Never true    Inability: Never true  . Transportation needs    Medical: No     Non-medical: No  Tobacco Use  . Smoking status: Never Smoker  . Smokeless tobacco: Never Used  Substance and Sexual Activity  . Alcohol use: No    Alcohol/week: 0.0 standard drinks  . Drug use: No  . Sexual activity: Never  Lifestyle  . Physical activity    Days per week: 0 days    Minutes per session: 0  min  . Stress: Not at all  Relationships  . Social connections    Talks on phone: More than three times a week    Gets together: More than three times a week    Attends religious service: More than 4 times per year    Active member of club or organization: Yes    Attends meetings of clubs or organizations: More than 4 times per year    Relationship status: Widowed  Other Topics Concern  . Not on file  Social History Narrative   Lives alone in independent living, single. Never married   Supportive daughter [name] -    Family History  Problem Relation Age of Onset  . Rheum arthritis Mother     Review of Systems  Constitutional: Negative for chills and fever.  Respiratory: Negative for cough, shortness of breath and wheezing.   Cardiovascular: Positive for leg swelling (left ankle). Negative for chest pain and palpitations.  Gastrointestinal: Negative for abdominal pain, constipation, diarrhea and nausea.       No gerd  Musculoskeletal: Positive for gait problem.  Neurological: Negative for dizziness, light-headedness and headaches.       Objective:   Vitals:   06/09/19 1108  BP: (!) 182/80  Pulse: 87  Resp: 16  Temp: 98.4 F (36.9 C)  SpO2: 96%   BP Readings from Last 3 Encounters:  06/09/19 (!) 182/80  09/11/18 (!) 192/80  02/18/18 (!) 144/72   Wt Readings from Last 3 Encounters:  06/09/19 112 lb (50.8 kg)  09/11/18 122 lb 12.8 oz (55.7 kg)  02/18/18 114 lb (51.7 kg)   Body mass index is 20.49 kg/m.   Physical Exam    Constitutional: Appears well-developed and well-nourished. No distress.  HENT:  Head: Normocephalic and atraumatic.  Neck:  Neck supple. No tracheal deviation present. No thyromegaly present.  No cervical lymphadenopathy Cardiovascular: Normal rate, regular rhythm and normal heart sounds.   No murmur heard. No carotid bruit .  Trace left ankle edema.  No RLE edema Pulmonary/Chest: Effort normal and breath sounds normal. No respiratory distress. No has no wheezes. No rales.  Abd: soft, NT, ND Skin: Skin is warm and dry. Not diaphoretic.  Psychiatric: Normal mood and affect. Behavior is normal.      Assessment & Plan:    See Problem List for Assessment and Plan of chronic medical problems.

## 2019-06-08 NOTE — Patient Instructions (Addendum)
  Tests ordered today. Your results will be released to MyChart (or called to you) after review.  If any changes need to be made, you will be notified at that same time.  Flu immunization administered today.    Medications reviewed and updated.  Changes include :  none      Please followup in 6 months   

## 2019-06-09 ENCOUNTER — Ambulatory Visit (INDEPENDENT_AMBULATORY_CARE_PROVIDER_SITE_OTHER): Payer: Medicare Other | Admitting: Internal Medicine

## 2019-06-09 ENCOUNTER — Other Ambulatory Visit (INDEPENDENT_AMBULATORY_CARE_PROVIDER_SITE_OTHER): Payer: Medicare Other

## 2019-06-09 ENCOUNTER — Other Ambulatory Visit: Payer: Self-pay

## 2019-06-09 ENCOUNTER — Encounter: Payer: Self-pay | Admitting: Internal Medicine

## 2019-06-09 VITALS — BP 182/80 | HR 87 | Temp 98.4°F | Resp 16 | Ht 62.0 in | Wt 112.0 lb

## 2019-06-09 DIAGNOSIS — Z111 Encounter for screening for respiratory tuberculosis: Secondary | ICD-10-CM

## 2019-06-09 DIAGNOSIS — I10 Essential (primary) hypertension: Secondary | ICD-10-CM

## 2019-06-09 DIAGNOSIS — F0150 Vascular dementia without behavioral disturbance: Secondary | ICD-10-CM

## 2019-06-09 DIAGNOSIS — F5101 Primary insomnia: Secondary | ICD-10-CM

## 2019-06-09 DIAGNOSIS — Z23 Encounter for immunization: Secondary | ICD-10-CM | POA: Diagnosis not present

## 2019-06-09 DIAGNOSIS — M810 Age-related osteoporosis without current pathological fracture: Secondary | ICD-10-CM | POA: Diagnosis not present

## 2019-06-09 DIAGNOSIS — I69359 Hemiplegia and hemiparesis following cerebral infarction affecting unspecified side: Secondary | ICD-10-CM

## 2019-06-09 DIAGNOSIS — E7849 Other hyperlipidemia: Secondary | ICD-10-CM | POA: Diagnosis not present

## 2019-06-09 DIAGNOSIS — E119 Type 2 diabetes mellitus without complications: Secondary | ICD-10-CM

## 2019-06-09 DIAGNOSIS — R2689 Other abnormalities of gait and mobility: Secondary | ICD-10-CM

## 2019-06-09 DIAGNOSIS — Z0279 Encounter for issue of other medical certificate: Secondary | ICD-10-CM

## 2019-06-09 DIAGNOSIS — F418 Other specified anxiety disorders: Secondary | ICD-10-CM

## 2019-06-09 LAB — COMPREHENSIVE METABOLIC PANEL
ALT: 11 U/L (ref 0–35)
AST: 15 U/L (ref 0–37)
Albumin: 4.2 g/dL (ref 3.5–5.2)
Alkaline Phosphatase: 102 U/L (ref 39–117)
BUN: 19 mg/dL (ref 6–23)
CO2: 27 mEq/L (ref 19–32)
Calcium: 9.4 mg/dL (ref 8.4–10.5)
Chloride: 100 mEq/L (ref 96–112)
Creatinine, Ser: 1.31 mg/dL — ABNORMAL HIGH (ref 0.40–1.20)
GFR: 46.01 mL/min — ABNORMAL LOW (ref >60.00)
Glucose, Bld: 375 mg/dL — ABNORMAL HIGH (ref 70–99)
Potassium: 3.9 mEq/L (ref 3.5–5.1)
Sodium: 137 mEq/L (ref 135–145)
Total Bilirubin: 0.4 mg/dL (ref 0.2–1.2)
Total Protein: 7.5 g/dL (ref 6.0–8.3)

## 2019-06-09 LAB — LIPID PANEL
Cholesterol: 160 mg/dL (ref 0–200)
HDL: 82.1 mg/dL (ref >39.00)
LDL Cholesterol: 64 mg/dL (ref 0–99)
NonHDL: 77.55
Total CHOL/HDL Ratio: 2
Triglycerides: 66 mg/dL (ref 0.0–149.0)
VLDL: 13.2 mg/dL (ref 0.0–40.0)

## 2019-06-09 LAB — CBC WITH DIFFERENTIAL/PLATELET
Basophils Absolute: 0 10*3/uL (ref 0.0–0.1)
Basophils Relative: 0.5 % (ref 0.0–3.0)
Eosinophils Absolute: 0 10*3/uL (ref 0.0–0.7)
Eosinophils Relative: 0.2 % (ref 0.0–5.0)
HCT: 33.6 % — ABNORMAL LOW (ref 36.0–46.0)
Hemoglobin: 10.9 g/dL — ABNORMAL LOW (ref 12.0–15.0)
Lymphocytes Relative: 18.9 % (ref 12.0–46.0)
Lymphs Abs: 0.8 10*3/uL (ref 0.7–4.0)
MCHC: 32.5 g/dL (ref 30.0–36.0)
MCV: 96.8 fl (ref 78.0–100.0)
Monocytes Absolute: 0.3 10*3/uL (ref 0.1–1.0)
Monocytes Relative: 5.7 % (ref 3.0–12.0)
Neutro Abs: 3.3 10*3/uL (ref 1.4–7.7)
Neutrophils Relative %: 74.7 % (ref 43.0–77.0)
Platelets: 164 10*3/uL (ref 150.0–400.0)
RBC: 3.47 Mil/uL — ABNORMAL LOW (ref 3.87–5.11)
RDW: 13.8 % (ref 11.5–15.5)
WBC: 4.5 10*3/uL (ref 4.0–10.5)

## 2019-06-09 LAB — HEMOGLOBIN A1C: Hgb A1c MFr Bld: 7.3 % — ABNORMAL HIGH (ref 4.6–6.5)

## 2019-06-09 LAB — VITAMIN D 25 HYDROXY (VIT D DEFICIENCY, FRACTURES): VITD: 97.47 ng/mL (ref 30.00–100.00)

## 2019-06-09 LAB — TSH: TSH: 2.42 u[IU]/mL (ref 0.35–4.50)

## 2019-06-09 NOTE — Assessment & Plan Note (Signed)
Controlled, stable Continue current dose of medication  

## 2019-06-09 NOTE — Assessment & Plan Note (Signed)
Continue prolia - due  Ck cmp, vit d level

## 2019-06-09 NOTE — Assessment & Plan Note (Signed)
Continue ambien

## 2019-06-09 NOTE — Assessment & Plan Note (Signed)
Well controlled at home Current regimen effective and well tolerated Continue current medications at current doses cmp 

## 2019-06-09 NOTE — Addendum Note (Signed)
Addended by: Delice Bison E on: 06/09/2019 02:35 PM   Modules accepted: Orders

## 2019-06-09 NOTE — Assessment & Plan Note (Signed)
Continue statin monitor BP ? On plavix

## 2019-06-09 NOTE — Assessment & Plan Note (Signed)
Would benefit from additional PT, OT

## 2019-06-09 NOTE — Assessment & Plan Note (Signed)
Check lipid panel  Continue daily statin Regular exercise and healthy diet encouraged  

## 2019-06-09 NOTE — Assessment & Plan Note (Signed)
Check a1c Continue basal insulin -- does not really need SS if not cheating

## 2019-06-10 ENCOUNTER — Other Ambulatory Visit: Payer: Self-pay | Admitting: Internal Medicine

## 2019-06-11 ENCOUNTER — Telehealth: Payer: Self-pay | Admitting: Internal Medicine

## 2019-06-11 LAB — QUANTIFERON-TB GOLD PLUS
Mitogen-NIL: >10 IU/mL
NIL: 0.04 IU/mL
QuantiFERON-TB Gold Plus: NEGATIVE
TB1-NIL: 0 IU/mL
TB2-NIL: 0 IU/mL

## 2019-06-11 NOTE — Telephone Encounter (Signed)
error 

## 2019-06-17 ENCOUNTER — Telehealth: Payer: Self-pay | Admitting: Internal Medicine

## 2019-06-17 NOTE — Telephone Encounter (Signed)
Patient has been submitted for insurance approval for Prolia injection through the Prolia Portal.  °Waiting on benefits. °Will contact patient once these have been received. °

## 2019-06-18 ENCOUNTER — Ambulatory Visit: Payer: Medicare Other | Admitting: Internal Medicine

## 2019-06-24 ENCOUNTER — Other Ambulatory Visit: Payer: Self-pay

## 2019-06-24 DIAGNOSIS — Z20822 Contact with and (suspected) exposure to covid-19: Secondary | ICD-10-CM

## 2019-06-25 LAB — NOVEL CORONAVIRUS, NAA: SARS-CoV-2, NAA: NOT DETECTED

## 2019-06-29 ENCOUNTER — Telehealth: Payer: Self-pay

## 2019-06-29 NOTE — Telephone Encounter (Signed)
Daughter wants to know if we can write an rx to have [facility] check pts blood sugars before each meal. States that even though she is not taking insulin anymore, pt would not eat the other day because they did not check her sugars before the meal. Ok to write?

## 2019-06-29 NOTE — Telephone Encounter (Signed)
Copied from [place] [phone]. Topic: General - Other >> Jun 29, 2019 10:31 AM Rainey Pines A wrote: Patients daughter [name] is requesting a callback from [facility] in regards to Dr. Quay Burow placing an order for patient to have her blood sugar checked before every meal in her assisted living facility. Best contact [phone]

## 2019-06-29 NOTE — Telephone Encounter (Signed)
Yes ok 

## 2019-06-29 NOTE — Telephone Encounter (Signed)
Orders written and faxed.

## 2019-07-07 ENCOUNTER — Telehealth: Payer: Self-pay | Admitting: Internal Medicine

## 2019-07-07 MED ORDER — ACETAMINOPHEN 500 MG PO TABS: 500.0000 mg | ORAL_TABLET | Freq: Four times a day (QID) | ORAL | 5 refills | Status: AC | PRN

## 2019-07-07 MED ORDER — RAISED TOILET SEAT MISC: 0 refills | Status: DC

## 2019-07-07 NOTE — Telephone Encounter (Signed)
Faxed

## 2019-07-07 NOTE — Telephone Encounter (Signed)
Does not want tylenol PM she wanted regular tylenol to take PRN to pain. Patricia Andrews will not give anything for pain without orders of some sort.

## 2019-07-07 NOTE — Telephone Encounter (Signed)
Toilet seat rx printed.   What is the tylenol PM for?  She is taking ambien nightly and should not be taking both.

## 2019-07-07 NOTE — Telephone Encounter (Signed)
printed

## 2019-07-07 NOTE — Telephone Encounter (Signed)
Copied from [place] [phone]. Topic: General - Inquiry >> Jul 07, 2019  9:17 AM Mathis Bud wrote: Reason for CRM: hazel (patients daughter) called stating she would like PCP to send over a prescription to [facility] for a toilet seat that rises.  And also tylenol pm.   FAX [facility] Call back [phone]

## 2019-07-15 ENCOUNTER — Other Ambulatory Visit: Payer: Self-pay | Admitting: Internal Medicine

## 2019-07-24 ENCOUNTER — Telehealth: Payer: Self-pay | Admitting: Internal Medicine

## 2019-07-24 NOTE — Telephone Encounter (Signed)
Patient's Daughter Erskine Speed is calling to ask Dr. Eilleen Kempf if the prescription for Prolia can that be forwarded to [facility] Dr. Patient did not receive the prolia while in the office. It was an over sight. Talked about so much while in the office. The patient's bones are hurting.  Cb- [phone]

## 2019-07-27 ENCOUNTER — Telehealth: Payer: Self-pay | Admitting: Internal Medicine

## 2019-07-27 NOTE — Telephone Encounter (Signed)
Last RF was 11/20/18 Last OV 06/09/19

## 2019-07-27 NOTE — Telephone Encounter (Signed)
Is this something that they can do?

## 2019-07-27 NOTE — Telephone Encounter (Signed)
Medication Refill - Medication: zolpidem (AMBIEN) 5 MG tablet  Requesting a hard script   Has the patient contacted their pharmacy? Yes.   (Agent: If no, request that the patient contact the pharmacy for the refill.) (Agent: If yes, when and what did the pharmacy advise?)  Preferred Pharmacy (with phone number or street name):  [facility]  Fax: [phone]   Agent: Please be advised that RX refills may take up to 3 business days. We ask that you follow-up with your pharmacy.

## 2019-07-27 NOTE — Telephone Encounter (Signed)
I have talked with dr burns ----prolia needs to be given at same practice the patient's pcp will be, labs for this medicine need to be followed by patient's pcp when taking this med---I have talked with daughter, she wants dr burns to remain patient's pcp----so, patient will need to come back to [facility] office for prolia injection----appt scheduled for 12/1   Ok to do per tamara---

## 2019-07-28 ENCOUNTER — Ambulatory Visit (INDEPENDENT_AMBULATORY_CARE_PROVIDER_SITE_OTHER): Payer: Medicare Other

## 2019-07-28 ENCOUNTER — Other Ambulatory Visit: Payer: Self-pay

## 2019-07-28 DIAGNOSIS — M810 Age-related osteoporosis without current pathological fracture: Secondary | ICD-10-CM | POA: Diagnosis not present

## 2019-07-28 MED ORDER — ZOLPIDEM TARTRATE 5 MG PO TABS: 5.0000 mg | ORAL_TABLET | Freq: Every evening | ORAL | 3 refills | Status: DC | PRN

## 2019-07-28 MED ORDER — DENOSUMAB 60 MG/ML ~~LOC~~ SOSY
60.0000 mg | PREFILLED_SYRINGE | Freq: Once | SUBCUTANEOUS | Status: AC
  Administered 2019-07-28: 60 mg via SUBCUTANEOUS

## 2019-07-30 NOTE — Progress Notes (Signed)
prolia Injection given.   Blain Hunsucker J Denaja Verhoeven, MD  

## 2019-08-12 ENCOUNTER — Telehealth: Payer: Self-pay | Admitting: Internal Medicine

## 2019-08-12 MED ORDER — ZOLPIDEM TARTRATE 5 MG PO TABS: 5.0000 mg | ORAL_TABLET | Freq: Every day | ORAL | 3 refills | Status: DC

## 2019-08-12 NOTE — Telephone Encounter (Signed)
Orders written and faxed.

## 2019-08-12 NOTE — Telephone Encounter (Signed)
Ok for PT.   Patricia Andrews - she was taking this nightly.  Updated rx in her med list - can send them a new copy to reflect nightly if they want.

## 2019-08-12 NOTE — Telephone Encounter (Signed)
Yetta Flock from [facility].  States they need to make PCP aware that pt is requesting zolpidem (AMBIEN) 5 MG tablet  nightly.  Also, states that they have had no response on PT orders that were requested over a month ago.

## 2019-08-12 NOTE — Telephone Encounter (Signed)
Please advise on ambien. We have no documentation that anyone called for PT orders. Will make them aware of that.

## 2019-08-19 ENCOUNTER — Telehealth: Payer: Self-pay

## 2019-08-19 NOTE — Telephone Encounter (Signed)
Copied from [place] [id number]. Topic: General - Inquiry >> Aug 19, 2019 11:13 AM Alease Frame wrote: Reason for CRM: Melissa calling from [place] . Caller wanted to know if patient has has a TB skin test.    Call back [phone]

## 2019-08-19 NOTE — Telephone Encounter (Signed)
Called back to let Patricia Andrews know that pt does not have a PPD on file.

## 2019-09-03 ENCOUNTER — Telehealth: Payer: Self-pay | Admitting: Internal Medicine

## 2019-09-03 DIAGNOSIS — R5381 Other malaise: Secondary | ICD-10-CM

## 2019-09-03 NOTE — Telephone Encounter (Signed)
Pt called and stated that she would like an order for PT sent over to [facility] assisted [facility]. Please advise

## 2019-09-04 NOTE — Telephone Encounter (Signed)
Referral ordered

## 2019-09-04 NOTE — Addendum Note (Signed)
Addended by: Pincus Sanes on: 09/04/2019 04:55 PM   Modules accepted: Orders

## 2019-09-04 NOTE — Telephone Encounter (Signed)
Spoke with daughter and she stated that this was discussed at previous visit. Her mom has not been moving around much and has been getting stiff. Daughter wants to try PT with [facility].

## 2019-09-11 ENCOUNTER — Other Ambulatory Visit: Payer: Self-pay | Admitting: Internal Medicine

## 2019-09-12 NOTE — Progress Notes (Signed)
Virtual Visit via telephone Note  I connected with Patricia Andrews on 09/14/19 at  2:30 PM EST by telephone and verified that I am speaking with the correct person using two identifiers.   I discussed the limitations of evaluation and management by telemedicine and the availability of in person appointments. The patient expressed understanding and agreed to proceed.  Present for the visit:  Myself, Dr Billey Gosling, Patricia Andrews and Patricia Andrews, who is an aide that works at [employer].  The patient is currently at home and I am in the office.    No referring provider.    History of Present Illness: This is an acute visit for referral for PT.  She has a history of CVA with residual left hemiparesis and poor balance.  With Covid pandemic she has been less active.  She is currently living at [facility] and they have active Covid cases and she has been less active because she has been sequestered into her apartment.  She is chronic left-sided hemiparesis and poor balance that is residual from a previous CVA and the inactivity is making it worse.  She and her family would like to see her doing some physical therapy.  She feels like she is sitting a lot.  She does like to read.  She does not like the food where she is living and wonders about a multivitamin.  She thinks that may help her.      She has no concerning symptoms and no other concerns.   Social History   Socioeconomic History  . Marital status: Single    Spouse name: Not on file  . Number of children: 2  . Years of education: Not on file  . Highest education level: Not on file  Occupational History  . Occupation: RETIRED    Employer: RETIRED  Tobacco Use  . Smoking status: Never Smoker  . Smokeless tobacco: Never Used  Substance and Sexual Activity  . Alcohol use: No    Alcohol/week: 0.0 standard drinks  . Drug use: No  . Sexual activity: Never  Other Topics Concern  . Not on file  Social History Narrative   Lives alone  in independent living, single. Never married   Supportive daughter Patricia Andrews -   Social Determinants of Health   Financial Resource Strain:   . Difficulty of Paying Living Expenses: Not on file  Food Insecurity:   . Worried About Charity fundraiser in the Last Year: Not on file  . Ran Out of Food in the Last Year: Not on file  Transportation Needs:   . Lack of Transportation (Medical): Not on file  . Lack of Transportation (Non-Medical): Not on file  Physical Activity:   . Days of Exercise per Week: Not on file  . Minutes of Exercise per Session: Not on file  Stress:   . Feeling of Stress : Not on file  Social Connections:   . Frequency of Communication with Friends and Family: Not on file  . Frequency of Social Gatherings with Friends and Family: Not on file  . Attends Religious Services: Not on file  . Active Member of Clubs or Organizations: Not on file  . Attends Archivist Meetings: Not on file  . Marital Status: Not on file      Assessment and Plan:  See Problem List for Assessment and Plan of chronic medical problems.   Follow Up Instructions:    I discussed the assessment and treatment plan with the patient. The  patient was provided an opportunity to ask questions and all were answered. The patient agreed with the plan and demonstrated an understanding of the instructions.   The patient was advised to call back or seek an in-person evaluation if the symptoms worsen or if the condition fails to improve as anticipated.  Time spent on telephone: 8 minutes  Pincus Sanes, MD

## 2019-09-14 ENCOUNTER — Encounter: Payer: Self-pay | Admitting: Internal Medicine

## 2019-09-14 ENCOUNTER — Ambulatory Visit (INDEPENDENT_AMBULATORY_CARE_PROVIDER_SITE_OTHER): Payer: Medicare Other | Admitting: Internal Medicine

## 2019-09-14 DIAGNOSIS — I69359 Hemiplegia and hemiparesis following cerebral infarction affecting unspecified side: Secondary | ICD-10-CM

## 2019-09-14 DIAGNOSIS — R2689 Other abnormalities of gait and mobility: Secondary | ICD-10-CM

## 2019-09-14 MED ORDER — MULTIVITAMINS PO CAPS: 1.0000 | ORAL_CAPSULE | Freq: Every day | ORAL | 3 refills | Status: AC

## 2019-09-14 NOTE — Assessment & Plan Note (Signed)
History of CVA 1980 with residual left-sided hemiparesis and poor balance Has been less active since Covid pandemic started and even more so due to Covid where she lives and being more sedentary since then Would benefit greatly from physical therapy-we will order 

## 2019-09-14 NOTE — Assessment & Plan Note (Signed)
History of CVA 1980 with residual left-sided hemiparesis and poor balance Has been less active since Covid pandemic started and even more so due to Covid where she lives and being more sedentary since then Would benefit greatly from physical therapy-we will order

## 2019-11-12 ENCOUNTER — Ambulatory Visit: Payer: Medicare Other | Admitting: Internal Medicine

## 2019-11-12 NOTE — Progress Notes (Signed)
Subjective:    Patient ID: Patricia Andrews, female    DOB: 05/29/1926, 86 y.o.   MRN: [id number]  HPI The patient is here for an acute visit.   Callus on right heel: She is here for a "callus" on her right heel.  He denies any tenderness.  It has been about 2 months.  She denies any activity.  Several months ago she moved into assisted living and she does have to walk to the dining room.  Other than that she is fairly active.  Medications and allergies reviewed with patient and updated if appropriate.  Patient Active Problem List   Diagnosis Date Noted  . Insomnia 09/10/2018  . Sensorineural hearing loss 02/10/2017  . Dyspnea on exertion 12/31/2016  . Dementia ([place]) 12/30/2016  . Hyperkalemia 07/24/2016  . Poor balance 07/11/2016  . Upper back pain 07/11/2016  . Pain due to onychomycosis of toenail 02/04/2013  . Diabetes type 2, controlled ([place]) 09/20/2011  . Hypertension 09/20/2011  . Hyperlipidemia 09/20/2011  . Senile osteoporosis 09/20/2011  . Depression with anxiety 09/20/2011  . Anemia 09/20/2011  . CVA, old, hemiparesis ([place])     Current Outpatient Medications on File Prior to Visit  Medication Sig Dispense Refill  . acetaminophen (TYLENOL) 500 MG tablet Take 1 tablet (500 mg total) by mouth every 6 (six) hours as needed for mild pain. 90 tablet 5  . atorvastatin (LIPITOR) 20 MG tablet TAKE 1 TABLET BY MOUTH  DAILY 90 tablet 3  . Blood Glucose Monitoring Suppl (ONETOUCH VERIO FLEX SYSTEM) w/Device KIT USE TO CHECK BLOOD SUGAR  TWO TIMES DAILY 100 kit 3  . citalopram (CELEXA) 20 MG tablet TAKE 1 TABLET BY MOUTH  DAILY 90 tablet 1  . Continuous Blood Gluc Sensor (Gateway) MISC Use as directed to check sugars.  Dx  E11.9 with hyperglycemia 7 each 3  . denosumab (PROLIA) 60 MG/ML SOLN injection Inject 60 mg into the skin every 6 (six) months. Administer in upper arm, thigh, or abdomen  Usually take on the 23rd of each month    . diltiazem (CARDIZEM  CD) 300 MG 24 hr capsule TAKE 1 CAPSULE BY MOUTH  DAILY 90 capsule 1  . donepezil (ARICEPT) 10 MG tablet TAKE 1 TABLET BY MOUTH AT  BEDTIME 90 tablet 1  . hydroxypropyl methylcellulose / hypromellose (ISOPTO TEARS / GONIOVISC) 2.5 % ophthalmic solution Place 1 drop into both eyes as needed for dry eyes (dry eyes).    . insulin lispro (HUMALOG KWIKPEN) 100 UNIT/ML KiwkPen Inject into the skin 3 (three) times daily. (Per sliding Scale) 151-200 = 2 units, 201-250 = 4 units, 251-300 = 6 units 301-350 = 8 units, 351-400 = 10 units    . Insulin Pen Needle 32G X 4 MM MISC Use to administer insulin four times a day Dx E11.9 30 each 0  . Lancets (ONETOUCH DELICA PLUS [id number]) MISC USE WITH LANCING DEVICE TO  CHECK BLOOD SUGAR TWO TIMES DAILY AS DIRECTED 200 each 1  . LANTUS SOLOSTAR 100 UNIT/ML Solostar Pen INJECT SUBCUTANEOUSLY 10  UNITS DAILY 15 mL 1  . memantine (NAMENDA) 5 MG tablet TAKE 1 TABLET BY MOUTH  TWICE DAILY 180 tablet 3  . mirtazapine (REMERON) 15 MG tablet TAKE 1 TABLET BY MOUTH AT  BEDTIME 90 tablet 1  . Misc. Devices (RAISED TOILET SEAT) MISC Use as directed 1 each 0  . Multiple Vitamin (MULTIVITAMIN) capsule Take 1 capsule by mouth daily. 90 capsule 3  .  olmesartan (BENICAR) 20 MG tablet TAKE 1 TABLET BY MOUTH  DAILY 90 tablet 3  . Omega-3 Fatty Acids (FISH OIL PO) Take 1 capsule by mouth every morning.    Glory Rosebush VERIO test strip CHECK BLOOD SUGAR TWO TIMES DAILY AS DIRECTED 200 strip 3  . vitamin B-12 (CYANOCOBALAMIN) 100 MCG tablet Take 100 mcg by mouth daily.    Marland Kitchen zolpidem (AMBIEN) 5 MG tablet Take 1 tablet (5 mg total) by mouth at bedtime. 30 tablet 3   No current facility-administered medications on file prior to visit.    Past Medical History:  Diagnosis Date  . Allergic rhinitis   . Anemia   . Arthritis   . Depression    anxiety overlap  . Diabetes mellitus, type 2 ([place])   . Distal radius fracture   . Hyperlipidemia   . Hypertension   . Osteoporosis, senile      accidental fall w/ pelvic fx 2006, L wrist fx 10/2013  . Pelvis fracture ([place])   . Right wrist fracture 05/14/2015  . Stroke [facility]) 1979   R MCA>>residual spastic L HP; TIA 06/2011    Past Surgical History:  Procedure Laterality Date  . ABDOMINAL HYSTERECTOMY  1960   Fibroid tumors  . COLONOSCOPY  06/06/2012   Procedure: COLONOSCOPY;  Surgeon: Inda Castle, MD;  Location: [facility] ENDOSCOPY;  Service: Endoscopy;  Laterality: N/A;  Rm 1501. Pt to be discharged after procedure. Patient was only admitted for prep.    Social History   Socioeconomic History  . Marital status: Single    Spouse name: Not on file  . Number of children: 2  . Years of education: Not on file  . Highest education level: Not on file  Occupational History  . Occupation: RETIRED    Employer: RETIRED  Tobacco Use  . Smoking status: Never Smoker  . Smokeless tobacco: Never Used  Substance and Sexual Activity  . Alcohol use: No    Alcohol/week: 0.0 standard drinks  . Drug use: No  . Sexual activity: Never  Other Topics Concern  . Not on file  Social History Narrative   Lives alone in independent living, single. Never married   Supportive daughter [name] -   Social Determinants of Health   Financial Resource Strain:   . Difficulty of Paying Living Expenses:   Food Insecurity:   . Worried About Charity fundraiser in the Last Year:   . Arboriculturist in the Last Year:   Transportation Needs:   . Film/video editor (Medical):   Marland Kitchen Lack of Transportation (Non-Medical):   Physical Activity:   . Days of Exercise per Week:   . Minutes of Exercise per Session:   Stress:   . Feeling of Stress :   Social Connections:   . Frequency of Communication with Friends and Family:   . Frequency of Social Gatherings with Friends and Family:   . Attends Religious Services:   . Active Member of Clubs or Organizations:   . Attends Archivist Meetings:   Marland Kitchen Marital Status:     Family History  Problem  Relation Age of Onset  . Rheum arthritis Mother     Review of Systems  Constitutional: Negative for chills and fever.  Skin: Positive for color change. Negative for wound.       Objective:   Vitals:   11/13/19 1429  BP: (!) 176/84  Pulse: 78  Temp: 98.4 F (36.9 C)  SpO2: 96%  BP Readings from Last 3 Encounters:  11/13/19 (!) 176/84  06/09/19 (!) 182/80  09/11/18 (!) 192/80   Wt Readings from Last 3 Encounters:  11/13/19 124 lb (56.2 kg)  06/09/19 112 lb (50.8 kg)  09/11/18 122 lb 12.8 oz (55.7 kg)   Body mass index is 22.68 kg/m.   Physical Exam Constitutional:      General: She is not in acute distress.    Appearance: Normal appearance. She is not ill-appearing.  HENT:     Head: Normocephalic and atraumatic.  Skin:    General: Skin is warm and dry.     Comments: Hyperpigmented oval and posterior heel with some dry flaking skin on the edges, nontender, no fluctuance, no break in the skin  Neurological:     Mental Status: She is alert.            Assessment & Plan:    See Problem List for Assessment and Plan of chronic medical problems.    This visit occurred during the SARS-CoV-2 public health emergency.  Safety protocols were in place, including screening questions prior to the visit, additional usage of staff PPE, and extensive cleaning of exam room while observing appropriate contact time as indicated for disinfecting solutions.

## 2019-11-13 ENCOUNTER — Ambulatory Visit (INDEPENDENT_AMBULATORY_CARE_PROVIDER_SITE_OTHER): Payer: Medicare Other | Admitting: Internal Medicine

## 2019-11-13 ENCOUNTER — Other Ambulatory Visit: Payer: Self-pay

## 2019-11-13 ENCOUNTER — Encounter: Payer: Self-pay | Admitting: Internal Medicine

## 2019-11-13 DIAGNOSIS — L89609 Pressure ulcer of unspecified heel, unspecified stage: Secondary | ICD-10-CM | POA: Insufficient documentation

## 2019-11-13 DIAGNOSIS — L89611 Pressure ulcer of right heel, stage 1: Secondary | ICD-10-CM | POA: Diagnosis not present

## 2019-11-13 MED ORDER — FISH OIL 1000 MG PO CAPS: ORAL_CAPSULE | ORAL | 3 refills | Status: AC

## 2019-11-13 MED ORDER — ZOLPIDEM TARTRATE 5 MG PO TABS: 5.0000 mg | ORAL_TABLET | Freq: Every day | ORAL | 5 refills | Status: DC

## 2019-11-13 NOTE — Assessment & Plan Note (Signed)
Acute Stage I pressure sore of posterior right heel Discussed etiology with her and her daughter Discussed the importance of avoiding pressure on the heel when lying or sitting-discussed that this is the only thing that will help with heel and prevent from getting worse Discussed that this can turn into something serious Okay to moisturize with Vaseline Monitor closely let me know if symptoms are not improving

## 2019-11-13 NOTE — Patient Instructions (Signed)
The area on your heel is a pressure sore.  You can moisturize it, but the only way to heal this is to prevent any pressure on the heel.      Pressure Injury  A pressure injury is damage to the skin and underlying tissue that results from pressure being applied to an area of the body. It often affects people who must spend a long time in a bed or chair because of a medical condition. Pressure injuries usually occur:  Over bony parts of the body, such as the tailbone, shoulders, elbows, hips, heels, spine, ankles, and back of the head.  Under medical devices that make contact with the body, such as respiratory equipment, stockings, tubes, and splints. Pressure injuries start as reddened areas on the skin and can lead to pain and an open wound. What are the causes? This condition is caused by frequent or constant pressure to an area of the body. Decreased blood flow to the skin can eventually cause the skin tissue to die and break down, causing a wound. What increases the risk? You are more likely to develop this condition if you:  Are in the hospital or an extended care facility.  Are bedridden or in a wheelchair.  Have an injury or disease that keeps you from: ? Moving normally. ? Feeling pain or pressure.  Have a condition that: ? Makes you sleepy or less alert. ? Causes poor blood flow.  Need to wear a medical device.  Have poor control of your bladder or bowel functions (incontinence).  Have poor nutrition (malnutrition). If you are at risk for pressure injuries, your health care provider may recommend certain types of mattresses, mattress covers, pillows, cushions, or boots to help prevent them. These may include products filled with air, foam, gel, or sand. What are the signs or symptoms? Symptoms of this condition depend on the severity of the injury. Symptoms may include:  Red or dark areas of the skin.  Pain, warmth, or a change of skin texture.  Blisters.  An open  wound. How is this diagnosed? This condition is diagnosed with a medical history and physical exam. You may also have tests, such as:  Blood tests.  Imaging tests.  Blood flow tests. Your pressure injury will be staged based on its severity. Staging is based on:  The depth of the tissue injury, including whether there is exposure of muscle, bone, or tendon.  The cause of the pressure injury. How is this treated? This condition may be treated by:  Relieving or redistributing pressure on your skin. This includes: ? Frequently changing your position. ? Avoiding positions that caused the wound or that can make the wound worse. ? Using specific bed mattresses, chair cushions, or protective boots. ? Moving medical devices from an area of pressure, or placing padding between the skin and the device. ? Using foams, creams, or powders to prevent rubbing (friction) on the skin.  Keeping your skin clean and dry. This may include using a skin cleanser or skin barrier as told by your health care provider.  Cleaning your injury and removing any dead tissue from the wound (debridement).  Placing a bandage (dressing) over your injury.  Using medicines for pain or to prevent or treat infection. Surgery may be needed if other treatments are not working or if your injury is very deep. Follow these instructions at home: Wound care  Follow instructions from your health care provider about how to take care of your wound. Make  sure you: ? Wash your hands with soap and water before and after you change your bandage (dressing). If soap and water are not available, use hand sanitizer. ? Change your dressing as told by your health care provider.  Check your wound every day for signs of infection. Have a caregiver do this for you if you are not able. Check for: ? Redness, swelling, or increased pain. ? More fluid or blood. ? Warmth. ? Pus or a bad smell. Skin care  Keep your skin clean and dry.  Gently pat your skin dry.  Do not rub or massage your skin.  You or a caregiver should check your skin every day for any changes in color or any new blisters or sores (ulcers). Medicines  Take over-the-counter and prescription medicines only as told by your health care provider.  If you were prescribed an antibiotic medicine, take or apply it as told by your health care provider. Do not stop using the antibiotic even if your condition improves. Reducing and redistributing pressure  Do not lie or sit in one position for a long time. Move or change position every 1-2 hours, or as told by your health care provider.  Use pillows or cushions to reduce pressure. Ask your health care provider to recommend cushions or pads for you. General instructions   Eat a healthy diet that includes lots of protein.  Drink enough fluid to keep your urine pale yellow.  Be as active as you can every day. Ask your health care provider to suggest safe exercises or activities.  Do not abuse drugs or alcohol.  Do not use any products that contain nicotine or tobacco, such as cigarettes, e-cigarettes, and chewing tobacco. If you need help quitting, ask your health care provider.  Keep all follow-up visits as told by your health care provider. This is important. Contact a health care provider if:  You have: ? A fever or chills. ? Pain that is not helped by medicine. ? Any changes in skin color. ? New blisters or sores. ? Pus or a bad smell coming from your wound. ? Redness, swelling, or pain around your wound. ? More fluid or blood coming from your wound.  Your wound does not improve after 1-2 weeks of treatment. Summary  A pressure injury is damage to the skin and underlying tissue that results from pressure being applied to an area of the body.  Do not lie or sit in one position for a long time. Your health care provider may advise you to move or change position every 1-2 hours.  Follow  instructions from your health care provider about how to take care of your wound.  Keep all follow-up visits as told by your health care provider. This is important. This information is not intended to replace advice given to you by your health care provider. Make sure you discuss any questions you have with your health care provider. Document Revised: 03/12/2018 Document Reviewed: 03/12/2018 Elsevier Patient Education  2020 [employer].

## 2019-11-17 ENCOUNTER — Telehealth: Payer: Self-pay | Admitting: Internal Medicine

## 2019-11-17 NOTE — Telephone Encounter (Signed)
Noted.  I assume they will monitor this closely for infection and do proper wound care.

## 2019-11-17 NOTE — Telephone Encounter (Signed)
New message:    Patricia Andrews is calling from [place] to make [place] aware that the patients left thumb was clipped while getting her nails trimmed on yesterday.

## 2019-12-16 ENCOUNTER — Telehealth: Payer: Self-pay

## 2019-12-16 NOTE — Telephone Encounter (Signed)
Spoke with daughter in regards and advised that form has been signed and faxed back over to [facility].

## 2019-12-16 NOTE — Telephone Encounter (Signed)
New message   Patricia Andrews patient daughter calling   [name] [facility] living office # [phone]  The facility will be faxing over a form it was sent on Thursday and again today regarding medication.   The daughter is asking for a callback.

## 2020-01-15 ENCOUNTER — Telehealth: Payer: Self-pay | Admitting: Internal Medicine

## 2020-01-15 DIAGNOSIS — I69359 Hemiplegia and hemiparesis following cerebral infarction affecting unspecified side: Secondary | ICD-10-CM

## 2020-01-15 DIAGNOSIS — E7849 Other hyperlipidemia: Secondary | ICD-10-CM

## 2020-01-15 NOTE — Progress Notes (Signed)
  Chronic Care Management   Note  01/15/2020 Name: MALISSA SLAY MRN: [id number] DOB: 1926/08/07  JANIYAH BEERY is a 86 y.o. year old female who is a primary care patient of Burns, Bobette Mo, MD. I reached out to [name] by phone today in response to a referral sent by Ms. Harl Favor PCP, Pincus Sanes, MD.   Ms. Beever was given information about Chronic Care Management services today including:  1. CCM service includes personalized support from designated clinical staff supervised by her physician, including individualized plan of care and coordination with other care providers 2. 24/7 contact phone numbers for assistance for urgent and routine care needs. 3. Service will only be billed when office clinical staff spend 20 minutes or more in a month to coordinate care. 4. Only one practitioner may furnish and bill the service in a calendar month. 5. The patient may stop CCM services at any time (effective at the end of the month) by phone call to the office staff.   Patient agreed to services and verbal consent obtained.   This note is not being shared with the patient for the following reason: To respect privacy (The patient or proxy has requested that the information not be shared).  Follow up plan:   Lynnae January [facility] Scheduler

## 2020-02-17 ENCOUNTER — Telehealth: Payer: Self-pay | Admitting: Internal Medicine

## 2020-02-17 NOTE — Telephone Encounter (Signed)
New message:    Pt's daughter is calling and states the pt would like to discontinue the Freestyle Libra and do manual insulin. She states everything that she would need to switch over needs to be faxed to [facility] so they can fill it for her. Please advise.

## 2020-02-18 MED ORDER — ONETOUCH VERIO VI STRP: ORAL_STRIP | 3 refills | Status: AC

## 2020-02-18 MED ORDER — ONETOUCH DELICA PLUS LANCET33G MISC: 3 refills | Status: AC

## 2020-02-18 MED ORDER — ONETOUCH VERIO FLEX SYSTEM W/DEVICE KIT: PACK | 0 refills | Status: DC

## 2020-02-18 NOTE — Telephone Encounter (Signed)
Please fax rx's to [facility]

## 2020-02-19 NOTE — Telephone Encounter (Signed)
Orders faxed in today.

## 2020-03-01 ENCOUNTER — Telehealth: Payer: Self-pay | Admitting: Internal Medicine

## 2020-03-01 DIAGNOSIS — E119 Type 2 diabetes mellitus without complications: Secondary | ICD-10-CM

## 2020-03-01 NOTE — Telephone Encounter (Signed)
printed

## 2020-03-01 NOTE — Telephone Encounter (Signed)
DC order for freestyle glucometer.  Can fax the order to [phone]

## 2020-03-02 NOTE — Telephone Encounter (Signed)
Faxed today

## 2020-03-21 ENCOUNTER — Telehealth: Payer: Self-pay | Admitting: Internal Medicine

## 2020-03-21 NOTE — Telephone Encounter (Signed)
Confirmed okay to receive Prolia. No PA needed.

## 2020-03-21 NOTE — Telephone Encounter (Signed)
   FYI   Patient scheduled for Prolia 7/30

## 2020-03-22 ENCOUNTER — Telehealth: Payer: Medicare Other

## 2020-03-22 NOTE — Progress Notes (Deleted)
Chronic Care Management Pharmacy  Name: Patricia Andrews  MRN: [id number] DOB: 1926/08/12   Chief Complaint/ HPI  Patricia Andrews,  86 y.o. , female presents for their Initial CCM visit with the clinical pharmacist via telephone due to COVID-19 Pandemic.  PCP : Binnie Rail, MD Patient Care Team: Binnie Rail, MD as PCP - General (Internal Medicine) Clearnce Sorrel, MD (Neurology) Camelia Phenes, DPM (Inactive) (Podiatry) Inda Castle, MD (Inactive) (Gastroenterology) Binnie Rail, MD as Consulting Physician (Internal Medicine) Charlton Haws, [facility] as Pharmacist (Pharmacist)  Their chronic conditions include: Hypertension, Hyperlipidemia, Diabetes, Depression, Anxiety, Osteoporosis and Dementia, Hx CVA  Office Visits: 11/13/19 Dr Quay Burow OV: acute visit for callus on R heel - Stage 1 pressure sore. Moisturize with Vaseline and monitor closely.  09/14/19 Dr Quay Burow VV: Hx CVA 1980 w/ L sided hemiparesis, poor balance. More sedentary during pandemic. Ordered PT.  Consult Visit: n/a  No Known Allergies  Medications: Outpatient Encounter Medications as of 03/22/2020  Medication Sig  . acetaminophen (TYLENOL) 500 MG tablet Take 1 tablet (500 mg total) by mouth every 6 (six) hours as needed for mild pain.  Marland Kitchen atorvastatin (LIPITOR) 20 MG tablet TAKE 1 TABLET BY MOUTH  DAILY  . Blood Glucose Monitoring Suppl (Ipava) w/Device KIT USE TO CHECK BLOOD SUGAR  TWO TIMES DAILY  . citalopram (CELEXA) 20 MG tablet TAKE 1 TABLET BY MOUTH  DAILY  . denosumab (PROLIA) 60 MG/ML SOLN injection Inject 60 mg into the skin every 6 (six) months. Administer in upper arm, thigh, or abdomen  Usually take on the 23rd of each month  . diltiazem (CARDIZEM CD) 300 MG 24 hr capsule TAKE 1 CAPSULE BY MOUTH  DAILY  . donepezil (ARICEPT) 10 MG tablet TAKE 1 TABLET BY MOUTH AT  BEDTIME  . glucose blood (ONETOUCH VERIO) test strip CHECK BLOOD SUGAR TWO TIMES DAILY AS DIRECTED.   E11.9  . hydroxypropyl methylcellulose / hypromellose (ISOPTO TEARS / GONIOVISC) 2.5 % ophthalmic solution Place 1 drop into both eyes as needed for dry eyes (dry eyes).  . insulin lispro (HUMALOG KWIKPEN) 100 UNIT/ML KiwkPen Inject into the skin 3 (three) times daily. (Per sliding Scale) 151-200 = 2 units, 201-250 = 4 units, 251-300 = 6 units 301-350 = 8 units, 351-400 = 10 units  . Insulin Pen Needle 32G X 4 MM MISC Use to administer insulin four times a day Dx E11.9  . Lancets (ONETOUCH DELICA PLUS [id number]) MISC USE WITH LANCING DEVICE TO  CHECK BLOOD SUGAR TWO TIMES DAILY AS DIRECTED. E11.9  . LANTUS SOLOSTAR 100 UNIT/ML Solostar Pen INJECT SUBCUTANEOUSLY 10  UNITS DAILY  . memantine (NAMENDA) 5 MG tablet TAKE 1 TABLET BY MOUTH  TWICE DAILY  . mirtazapine (REMERON) 15 MG tablet TAKE 1 TABLET BY MOUTH AT  BEDTIME  . Misc. Devices (RAISED TOILET SEAT) MISC Use as directed  . Multiple Vitamin (MULTIVITAMIN) capsule Take 1 capsule by mouth daily.  Marland Kitchen olmesartan (BENICAR) 20 MG tablet TAKE 1 TABLET BY MOUTH  DAILY  . Omega-3 Fatty Acids (FISH OIL) 1000 MG CAPS Take 1 capsule by mouth every morning.  . vitamin B-12 (CYANOCOBALAMIN) 100 MCG tablet Take 100 mcg by mouth daily.  Marland Kitchen zolpidem (AMBIEN) 5 MG tablet Take 1 tablet (5 mg total) by mouth at bedtime.   No facility-administered encounter medications on file as of 03/22/2020.     Current Diagnosis/Assessment:    Goals Addressed   None  Hypertension   BP goal is:  < 150/90  Office blood pressures are  BP Readings from Last 3 Encounters:  11/13/19 (!) 176/84  06/09/19 (!) 182/80  09/11/18 (!) 192/80   Patient checks BP at home {CHL HP BP Monitoring Frequency:2109141004} Patient home BP readings are ranging: ***  Patient has failed these meds in the past: *** Patient is currently {CHL Controlled/Uncontrolled:2109141014} on the following medications:  . Diltiazem 300 mg daily . Olmesartan 20 mg daily  We discussed {CHL  HP Upstream Pharmacy discussion:2109141007}  Plan  Continue {CHL HP Upstream Pharmacy Plans:2109141003}     Hyperlipidemia   LDL goal < 70  Lipid Panel     Component Value Date/Time   CHOL 160 06/09/2019 1157   TRIG 66.0 06/09/2019 1157   HDL 82.10 06/09/2019 1157   LDLCALC 64 06/09/2019 1157   LDLDIRECT 108.6 04/21/2013 1010    Hepatic Function Latest Ref Rng & Units 06/09/2019 09/11/2018 02/10/2018  Total Protein 6.0 - 8.3 g/dL 7.5 7.5 7.7  Albumin 3.5 - 5.2 g/dL 4.2 4.2 4.2  AST 0 - 37 U/L 15 18 19  ALT 0 - 35 U/L 11 14 15  Alk Phosphatase 39 - 117 U/L 102 69 70  Total Bilirubin 0.2 - 1.2 mg/dL 0.4 0.4 0.3  Bilirubin, Direct 0.0 - 0.3 mg/dL - - -     The ASCVD Risk score ([place] DC Jr., et al., 2013) failed to calculate for the following reasons:   The 2013 ASCVD risk score is only valid for ages 40 to 79   Patient has failed these meds in past: *** Patient is currently {CHL Controlled/Uncontrolled:2109141014} on the following medications:  . Atorvastatin 20 mg daily . OTC Fish oil 1000 mg daily  We discussed:  {CHL HP Upstream Pharmacy discussion:2109141007}  Plan  Continue {CHL HP Upstream Pharmacy Plans:2109141003}  Diabetes   A1c goal <8%  Recent Relevant Labs: Lab Results  Component Value Date/Time   HGBA1C 7.3 (H) 06/09/2019 11:57 AM   HGBA1C 7.0 (H) 09/11/2018 01:55 PM   GFR 46.01 (L) 06/09/2019 11:57 AM   GFR 48.20 (L) 09/11/2018 01:55 PM   MICROALBUR 3.1 (H) 04/13/2015 12:36 PM   MICROALBUR 2.6 (H) 12/02/2014 10:23 AM    Last diabetic Eye exam:  Lab Results  Component Value Date/Time   HMDIABEYEEXA No Retinopathy 02/25/2015 12:00 AM    Last diabetic Foot exam:  Lab Results  Component Value Date/Time   HMDIABFOOTEX done podiatry q6mo 04/13/2015 12:00 AM     Checking BG: {CHL HP Blood Glucose Monitoring Frequency:2109141001}  Recent FBG Readings: *** Recent pre-meal BG readings: *** Recent 2hr PP BG readings:  *** Recent HS BG readings:  ***  Patient has failed these meds in past: *** Patient is currently {CHL Controlled/Uncontrolled:2109141014} on the following medications: . Lantus 10 units daily . Humalog SSI o BG 151-200: 2 units o BG 201-250: 4 units o BG 251-300: 6 units o BG 301-350: 8 units o BG 351-400: 10 units  We discussed: {CHL HP Upstream Pharmacy discussion:2109141007}  Plan  Continue {CHL HP Upstream Pharmacy Plans:2109141003}  Depression   Depression screen PHQ 2/9 02/18/2018 02/10/2018 04/07/2016  Decreased Interest 1 1 0  Down, Depressed, Hopeless 2 0 0  PHQ - 2 Score 3 1 0  Altered sleeping 0 - -  Tired, decreased energy 1 - -  Change in appetite 0 - -  Feeling bad or failure about yourself  1 - -  Trouble concentrating 0 - -    Moving slowly or fidgety/restless 0 - -  Suicidal thoughts 0 - -  PHQ-9 Score 5 - -  Difficult doing work/chores Not difficult at all - -   Patient has failed these meds in past: *** Patient is currently {CHL Controlled/[phone]} on the following medications:  . Citalopram 20 mg daily . Mirtazapine 15 mg HS  We discussed:  ***  Plan  Continue {CHL HP Upstream Pharmacy IDPOE:[id number]}  Dementia   Patient has failed these meds in past: *** Patient is currently {CHL Controlled/[phone]} on the following medications:  Marland Kitchen Memantine 5 mg BID . Donepezil 10 mg HS  We discussed:  ***  Plan  Continue {CHL HP Upstream Pharmacy Plans:[phone]}  Insomnia   Patient has failed these meds in past: *** Patient is currently {CHL Controlled/[phone]} on the following medications:  Marland Kitchen Zolpidem 5 mg HS . Mirtazapine 15 mg HS  We discussed:  ***  Plan  Continue {CHL HP Upstream Pharmacy Plans:[phone]}  Osteopenia / Osteoporosis   Last DEXA Scan: 09/28/2013  T-Score femoral neck: -2.8  VITD  Date Value Ref Range Status  06/09/2019 97.47 30.00 - 100.00 ng/mL Final    Patient is a candidate for pharmacologic  treatment due to T-Score < -2.5 in femoral neck  Patient has failed these meds in past: n/a Patient is currently {CHL Controlled/[phone]} on the following medications:  . Prolia 50 mg [place] every 6 months (last given 07/29/2019)  We discussed:  {Osteoporosis Counseling:23892}  Plan  Continue {CHL HP Upstream Pharmacy Plans:[phone]}  Health Maintenance   Patient is currently {CHL Controlled/[phone]} on the following medications:  Marland Kitchen Vitamin B12 100 mcg daily . Multivitamin . Tylenol 500 mg PRN  We discussed:  ***  Plan  Continue {CHL HP Upstream Pharmacy XVQMG:[id number]}  Medication Management   Pt uses *** pharmacy for all medications Uses pill box? {Yes or If no, why not?:20788} Pt endorses ***% compliance  We discussed: ***  Plan  {US Pharmacy OIZT:24580}    Follow up: *** month phone visit  ***

## 2020-03-23 ENCOUNTER — Telehealth: Payer: Medicare Other

## 2020-03-23 NOTE — Chronic Care Management (AMB) (Deleted)
 Chronic Care Management Pharmacy  Name: Patricia Andrews  MRN: [id number] DOB: 11/22/1925   Chief Complaint/ HPI  Patricia Andrews,  86 y.o. , female presents for their Initial CCM visit with the clinical pharmacist via telephone due to COVID-19 Pandemic.  PCP : Burns, Stacy J, MD Patient Care Team: Burns, Stacy J, MD as PCP - General (Internal Medicine) Wong, Matthew H, MD (Neurology) Sheard, Myeong O, DPM (Inactive) (Podiatry) Kaplan, Robert D, MD (Inactive) (Gastroenterology) Burns, Stacy J, MD as Consulting Physician (Internal Medicine) Shalen Petrak N, RPH as Pharmacist (Pharmacist)  Their chronic conditions include: Hypertension, Hyperlipidemia, Diabetes, Depression, Anxiety, Osteoporosis and Dementia, Hx CVA  Office Visits: 11/13/19 Dr Burns OV: acute visit for callus on R heel - Stage 1 pressure sore. Moisturize with Vaseline and monitor closely.  09/14/19 Dr Burns VV: Hx CVA 1980 w/ L sided hemiparesis, poor balance. More sedentary during pandemic. Ordered PT.  Consult Visit: n/a  No Known Allergies  Medications: Outpatient Encounter Medications as of 03/23/2020  Medication Sig  . acetaminophen (TYLENOL) 500 MG tablet Take 1 tablet (500 mg total) by mouth every 6 (six) hours as needed for mild pain.  . atorvastatin (LIPITOR) 20 MG tablet TAKE 1 TABLET BY MOUTH  DAILY  . Blood Glucose Monitoring Suppl (ONETOUCH VERIO FLEX SYSTEM) w/Device KIT USE TO CHECK BLOOD SUGAR  TWO TIMES DAILY  . citalopram (CELEXA) 20 MG tablet TAKE 1 TABLET BY MOUTH  DAILY  . denosumab (PROLIA) 60 MG/ML SOLN injection Inject 60 mg into the skin every 6 (six) months. Administer in upper arm, thigh, or abdomen  Usually take on the 23rd of each month  . diltiazem (CARDIZEM CD) 300 MG 24 hr capsule TAKE 1 CAPSULE BY MOUTH  DAILY  . donepezil (ARICEPT) 10 MG tablet TAKE 1 TABLET BY MOUTH AT  BEDTIME  . glucose blood (ONETOUCH VERIO) test strip CHECK BLOOD SUGAR TWO TIMES DAILY AS DIRECTED.   E11.9  . hydroxypropyl methylcellulose / hypromellose (ISOPTO TEARS / GONIOVISC) 2.5 % ophthalmic solution Place 1 drop into both eyes as needed for dry eyes (dry eyes).  . insulin lispro (HUMALOG KWIKPEN) 100 UNIT/ML KiwkPen Inject into the skin 3 (three) times daily. (Per sliding Scale) 151-200 = 2 units, 201-250 = 4 units, 251-300 = 6 units 301-350 = 8 units, 351-400 = 10 units  . Insulin Pen Needle 32G X 4 MM MISC Use to administer insulin four times a day Dx E11.9  . Lancets (ONETOUCH DELICA PLUS [id number]) MISC USE WITH LANCING DEVICE TO  CHECK BLOOD SUGAR TWO TIMES DAILY AS DIRECTED. E11.9  . LANTUS SOLOSTAR 100 UNIT/ML Solostar Pen INJECT SUBCUTANEOUSLY 10  UNITS DAILY  . memantine (NAMENDA) 5 MG tablet TAKE 1 TABLET BY MOUTH  TWICE DAILY  . mirtazapine (REMERON) 15 MG tablet TAKE 1 TABLET BY MOUTH AT  BEDTIME  . Misc. Devices (RAISED TOILET SEAT) MISC Use as directed  . Multiple Vitamin (MULTIVITAMIN) capsule Take 1 capsule by mouth daily.  . olmesartan (BENICAR) 20 MG tablet TAKE 1 TABLET BY MOUTH  DAILY  . Omega-3 Fatty Acids (FISH OIL) 1000 MG CAPS Take 1 capsule by mouth every morning.  . vitamin B-12 (CYANOCOBALAMIN) 100 MCG tablet Take 100 mcg by mouth daily.  . zolpidem (AMBIEN) 5 MG tablet Take 1 tablet (5 mg total) by mouth at bedtime.   No facility-administered encounter medications on file as of 03/23/2020.     Current Diagnosis/Assessment:    Goals Addressed   None       Hypertension   BP goal is:  < 150/90  Office blood pressures are  BP Readings from Last 3 Encounters:  11/13/19 (!) 176/84  06/09/19 (!) 182/80  09/11/18 (!) 192/80   Patient checks BP at home {CHL HP BP Monitoring Frequency:[phone]} Patient home BP readings are ranging: ***  Patient has failed these meds in the past: *** Patient is currently {CHL Controlled/Uncontrolled:[phone]} on the following medications:  . Diltiazem 300 mg daily . Olmesartan 20 mg daily  We discussed {CHL  HP Upstream Pharmacy discussion:[phone]}  Plan  Continue {CHL HP Upstream Pharmacy [phone]}     Hyperlipidemia   LDL goal < 70  Lipid Panel     Component Value Date/Time   CHOL 160 06/09/2019 1157   TRIG 66.0 06/09/2019 1157   HDL 82.10 06/09/2019 1157   LDLCALC 64 06/09/2019 1157   LDLDIRECT 108.6 04/21/2013 1010    Hepatic Function Latest Ref Rng & Units 06/09/2019 09/11/2018 02/10/2018  Total Protein 6.0 - 8.3 g/dL 7.5 7.5 7.7  Albumin 3.5 - 5.2 g/dL 4.2 4.2 4.2  AST 0 - 37 U/L _0 ALT 0 - 35 U/L _1 Alk Phosphatase 39 - 117 U/L 102 69 70  Total Bilirubin 0.2 - 1.2 mg/dL 0.4 0.4 0.3  Bilirubin, Direct 0.0 - 0.3 mg/dL - - -    The ASCVD Risk score ([place] et al., 2013) failed to calculate for the following reasons:   The 2013 ASCVD risk score is only valid for ages 29 to 8   Patient has failed these meds in past: *** Patient is currently {CHL Controlled/Uncontrolled:[phone]} on the following medications:  . Atorvastatin 20 mg daily . OTC Fish oil 1000 mg daily  We discussed:  {CHL HP Upstream Pharmacy discussion:[phone]}  Plan  Continue {CHL HP Upstream Pharmacy [phone]}  Diabetes   A1c goal <8%  Recent Relevant Labs: Lab Results  Component Value Date/Time   HGBA1C 7.3 (H) 06/09/2019 11:57 AM   HGBA1C 7.0 (H) 09/11/2018 01:55 PM   GFR 46.01 (L) 06/09/2019 11:57 AM   GFR 48.20 (L) 09/11/2018 01:55 PM   MICROALBUR 3.1 (H) 04/13/2015 12:36 PM   MICROALBUR 2.6 (H) 12/02/2014 10:23 AM    Last diabetic Eye exam:  Lab Results  Component Value Date/Time   HMDIABEYEEXA No Retinopathy 02/25/2015 12:00 AM    Last diabetic Foot exam:  Lab Results  Component Value Date/Time   HMDIABFOOTEX done podiatry q18mo08/17/2016 12:00 AM    Checking BG: {CHL HP Blood Glucose Monitoring [phone]}  Recent FBG Readings: *** Recent pre-meal BG readings: *** Recent 2hr PP BG readings:  *** Recent HS BG readings:  ***  Patient has failed these meds in past: *** Patient is currently {CHL Controlled/Uncontrolled:[phone]} on the following medications: . Lantus 10 units daily . Humalog SSI o BG 151-200: 2 units o BG 201-250: 4 units o BG 251-300: 6 units o BG 301-350: 8 units o BG 351-400: 10 units  We discussed: {CHL HP Upstream Pharmacy discussion:[phone]}  Plan  Continue {CHL HP Upstream Pharmacy PGYKZL:[id number]} Depression   [facility] 02/18/2018 02/10/2018 04/07/2016  Decreased Interest 1 1 0  Down, Depressed, Hopeless 2 0 0  PHQ - 2 Score 3 1 0  Altered sleeping 0 - -  Tired, decreased energy 1 - -  Change in appetite 0 - -  Feeling bad or failure about yourself  1 - -  Trouble concentrating 0 - -  Moving slowly  or fidgety/restless 0 - -  Suicidal thoughts 0 - -  PHQ-9 Score 5 - -  Difficult doing work/chores Not difficult at all - -   Patient has failed these meds in past: *** Patient is currently {CHL Controlled/Uncontrolled:[phone]} on the following medications:  . Citalopram 20 mg daily . Mirtazapine 15 mg HS  We discussed:  ***  Plan  Continue {CHL HP Upstream Pharmacy LUDAP:[id number]}  Dementia   Patient has failed these meds in past: *** Patient is currently {CHL Controlled/Uncontrolled:[phone]} on the following medications:  Marland Kitchen Memantine 5 mg BID . Donepezil 10 mg HS  We discussed:  ***  Plan  Continue {CHL HP Upstream Pharmacy Plans:[phone]}  Insomnia   Patient has failed these meds in past: *** Patient is currently {CHL Controlled/Uncontrolled:[phone]} on the following medications:  Marland Kitchen Zolpidem 5 mg HS . Mirtazapine 15 mg HS  We discussed:  ***  Plan  Continue {CHL HP Upstream Pharmacy Plans:[phone]}  Osteoporosis   Last DEXA Scan: 09/28/2013  T-Score femoral neck: -2.8  VITD  Date Value Ref Range Status  06/09/2019 97.47 30.00 - 100.00 ng/mL Final    Patient is a candidate for pharmacologic treatment  due to T-Score < -2.5 in femoral neck  Patient has failed these meds in past: n/a Patient is currently {CHL Controlled/Uncontrolled:[phone]} on the following medications:  . Prolia 50 mg [place] every 6 months (last given 07/29/2019)  We discussed:  {Osteoporosis Counseling:23892}  Plan  Continue {CHL HP Upstream Pharmacy Plans:[phone]}  Health Maintenance   Patient is currently {CHL Controlled/Uncontrolled:[phone]} on the following medications:  Marland Kitchen Vitamin B12 100 mcg daily . Multivitamin . Tylenol 500 mg PRN  We discussed:  ***  Plan  Continue {CHL HP Upstream Pharmacy IDKSM:[id number]}  Medication Management   Pt uses *** pharmacy for all medications Uses pill box? {Yes or If no, why not?:20788} Pt endorses ***% compliance  We discussed: ***  Plan  {US Pharmacy DGNP:54301}    Follow up: *** month phone visit  ***

## 2020-03-23 NOTE — Progress Notes (Deleted)
 [facility]  Name: Patricia Andrews  MRN: [id number] DOB: 09/16/1925   Chief Complaint/ HPI  Patricia Andrews,  86 y.o. , female presents for their Initial CCM visit with the clinical pharmacist via telephone due to COVID-19 Pandemic.  PCP : Burns, Stacy J, MD Patient Care Team: Burns, Stacy J, MD as PCP - General (Internal Medicine) Wong, Matthew H, MD (Neurology) Sheard, Myeong O, DPM (Inactive) (Podiatry) Kaplan, Robert D, MD (Inactive) (Gastroenterology) Burns, Stacy J, MD as Consulting Physician (Internal Medicine) ,  N, RPH as Pharmacist (Pharmacist)  Their chronic conditions include: Hypertension, Hyperlipidemia, Diabetes, Depression, Anxiety, Osteoporosis and Dementia, Hx CVA  Office Visits: 11/13/19 Dr Burns OV: acute visit for callus on R heel - Stage 1 pressure sore. Moisturize with Vaseline and monitor closely.  09/14/19 Dr Burns VV: Hx CVA 1980 w/ L sided hemiparesis, poor balance. More sedentary during pandemic. Ordered PT.  Consult Visit: n/a  No Known Allergies  Medications: Outpatient Encounter Medications as of 03/23/2020  Medication Sig  . acetaminophen (TYLENOL) 500 MG tablet Take 1 tablet (500 mg total) by mouth every 6 (six) hours as needed for mild pain.  . atorvastatin (LIPITOR) 20 MG tablet TAKE 1 TABLET BY MOUTH  DAILY  . Blood Glucose Monitoring Suppl (ONETOUCH VERIO FLEX SYSTEM) w/Device KIT USE TO CHECK BLOOD SUGAR  TWO TIMES DAILY  . citalopram (CELEXA) 20 MG tablet TAKE 1 TABLET BY MOUTH  DAILY  . denosumab (PROLIA) 60 MG/ML SOLN injection Inject 60 mg into the skin every 6 (six) months. Administer in upper arm, thigh, or abdomen  Usually take on the 23rd of each month  . diltiazem (CARDIZEM CD) 300 MG 24 hr capsule TAKE 1 CAPSULE BY MOUTH  DAILY  . donepezil (ARICEPT) 10 MG tablet TAKE 1 TABLET BY MOUTH AT  BEDTIME  . glucose blood (ONETOUCH VERIO) test strip CHECK BLOOD SUGAR TWO TIMES DAILY AS DIRECTED.   E11.9  . hydroxypropyl methylcellulose / hypromellose (ISOPTO TEARS / GONIOVISC) 2.5 % ophthalmic solution Place 1 drop into both eyes as needed for dry eyes (dry eyes).  . insulin lispro (HUMALOG KWIKPEN) 100 UNIT/ML KiwkPen Inject into the skin 3 (three) times daily. (Per sliding Scale) 151-200 = 2 units, 201-250 = 4 units, 251-300 = 6 units 301-350 = 8 units, 351-400 = 10 units  . Insulin Pen Needle 32G X 4 MM MISC Use to administer insulin four times a day Dx E11.9  . Lancets (ONETOUCH DELICA PLUS LANCET33G) MISC USE WITH LANCING DEVICE TO  CHECK BLOOD SUGAR TWO TIMES DAILY AS DIRECTED. E11.9  . LANTUS SOLOSTAR 100 UNIT/ML Solostar Pen INJECT SUBCUTANEOUSLY 10  UNITS DAILY  . memantine (NAMENDA) 5 MG tablet TAKE 1 TABLET BY MOUTH  TWICE DAILY  . mirtazapine (REMERON) 15 MG tablet TAKE 1 TABLET BY MOUTH AT  BEDTIME  . Misc. Devices (RAISED TOILET SEAT) MISC Use as directed  . Multiple Vitamin (MULTIVITAMIN) capsule Take 1 capsule by mouth daily.  . olmesartan (BENICAR) 20 MG tablet TAKE 1 TABLET BY MOUTH  DAILY  . Omega-3 Fatty Acids (FISH OIL) 1000 MG CAPS Take 1 capsule by mouth every morning.  . vitamin B-12 (CYANOCOBALAMIN) 100 MCG tablet Take 100 mcg by mouth daily.  . zolpidem (AMBIEN) 5 MG tablet Take 1 tablet (5 mg total) by mouth at bedtime.   No facility-administered encounter medications on file as of 03/23/2020.     Current Diagnosis/Assessment:    Goals Addressed   None       Hypertension   BP goal is:  < 150/90  Office blood pressures are  BP Readings from Last 3 Encounters:  11/13/19 (!) 176/84  06/09/19 (!) 182/80  09/11/18 (!) 192/80   Patient checks BP at home {CHL HP BP Monitoring Frequency:2109141004} Patient home BP readings are ranging: ***  Patient has failed these meds in the past: *** Patient is currently {CHL Controlled/Uncontrolled:2109141014} on the following medications:  . Diltiazem 300 mg daily . Olmesartan 20 mg daily  We discussed {CHL  HP Upstream Pharmacy discussion:2109141007}  Plan  Continue {CHL HP Upstream Pharmacy Plans:2109141003}     Hyperlipidemia   LDL goal < 70  Lipid Panel     Component Value Date/Time   CHOL 160 06/09/2019 1157   TRIG 66.0 06/09/2019 1157   HDL 82.10 06/09/2019 1157   LDLCALC 64 06/09/2019 1157   LDLDIRECT 108.6 04/21/2013 1010    Hepatic Function Latest Ref Rng & Units 06/09/2019 09/11/2018 02/10/2018  Total Protein 6.0 - 8.3 g/dL 7.5 7.5 7.7  Albumin 3.5 - 5.2 g/dL 4.2 4.2 4.2  AST 0 - 37 U/L 15 18 19  ALT 0 - 35 U/L 11 14 15  Alk Phosphatase 39 - 117 U/L 102 69 70  Total Bilirubin 0.2 - 1.2 mg/dL 0.4 0.4 0.3  Bilirubin, Direct 0.0 - 0.3 mg/dL - - -     The ASCVD Risk score (Goff DC Jr., et al., 2013) failed to calculate for the following reasons:   The 2013 ASCVD risk score is only valid for ages 40 to 79   Patient has failed these meds in past: *** Patient is currently {CHL Controlled/Uncontrolled:2109141014} on the following medications:  . Atorvastatin 20 mg daily . OTC Fish oil 1000 mg daily  We discussed:  {CHL HP Upstream Pharmacy discussion:2109141007}  Plan  Continue {CHL HP Upstream Pharmacy Plans:2109141003}  Diabetes   A1c goal <8%  Recent Relevant Labs: Lab Results  Component Value Date/Time   HGBA1C 7.3 (H) 06/09/2019 11:57 AM   HGBA1C 7.0 (H) 09/11/2018 01:55 PM   GFR 46.01 (L) 06/09/2019 11:57 AM   GFR 48.20 (L) 09/11/2018 01:55 PM   MICROALBUR 3.1 (H) 04/13/2015 12:36 PM   MICROALBUR 2.6 (H) 12/02/2014 10:23 AM    Last diabetic Eye exam:  Lab Results  Component Value Date/Time   HMDIABEYEEXA No Retinopathy 02/25/2015 12:00 AM    Last diabetic Foot exam:  Lab Results  Component Value Date/Time   HMDIABFOOTEX done podiatry q6mo 04/13/2015 12:00 AM     Checking BG: {CHL HP Blood Glucose Monitoring Frequency:2109141001}  Recent FBG Readings: *** Recent pre-meal BG readings: *** Recent 2hr PP BG readings:  *** Recent HS BG readings:  ***  Patient has failed these meds in past: *** Patient is currently {CHL Controlled/Uncontrolled:2109141014} on the following medications: . Lantus 10 units daily . Humalog SSI o BG 151-200: 2 units o BG 201-250: 4 units o BG 251-300: 6 units o BG 301-350: 8 units o BG 351-400: 10 units  We discussed: {CHL HP Upstream Pharmacy discussion:2109141007}  Plan  Continue {CHL HP Upstream Pharmacy Plans:2109141003}  Depression   Depression screen PHQ 2/9 02/18/2018 02/10/2018 04/07/2016  Decreased Interest 1 1 0  Down, Depressed, Hopeless 2 0 0  PHQ - 2 Score 3 1 0  Altered sleeping 0 - -  Tired, decreased energy 1 - -  Change in appetite 0 - -  Feeling bad or failure about yourself  1 - -  Trouble concentrating 0 - -    Moving slowly or fidgety/restless 0 - -  Suicidal thoughts 0 - -  PHQ-9 Score 5 - -  Difficult doing work/chores Not difficult at all - -   Patient has failed these meds in past: *** Patient is currently {CHL Controlled/Uncontrolled:2109141014} on the following medications:  . Citalopram 20 mg daily . Mirtazapine 15 mg HS  We discussed:  ***  Plan  Continue {CHL HP Upstream Pharmacy Plans:2109141003}  Dementia   Patient has failed these meds in past: *** Patient is currently {CHL Controlled/Uncontrolled:2109141014} on the following medications:  . Memantine 5 mg BID . Donepezil 10 mg HS  We discussed:  ***  Plan  Continue {CHL HP Upstream Pharmacy Plans:2109141003}  Insomnia   Patient has failed these meds in past: *** Patient is currently {CHL Controlled/Uncontrolled:2109141014} on the following medications:  . Zolpidem 5 mg HS . Mirtazapine 15 mg HS  We discussed:  ***  Plan  Continue {CHL HP Upstream Pharmacy Plans:2109141003}  Osteopenia / Osteoporosis   Last DEXA Scan: 09/28/2013  T-Score femoral neck: -2.8  VITD  Date Value Ref Range Status  06/09/2019 97.47 30.00 - 100.00 ng/mL Final    Patient is a candidate for pharmacologic  treatment due to T-Score < -2.5 in femoral neck  Patient has failed these meds in past: n/a Patient is currently {CHL Controlled/Uncontrolled:2109141014} on the following medications:  . Prolia 50 mg [place] every 6 months (last given 07/29/2019)  We discussed:  {Osteoporosis Counseling:23892}  Plan  Continue {CHL HP Upstream Pharmacy Plans:2109141003}  Health Maintenance   Patient is currently {CHL Controlled/Uncontrolled:2109141014} on the following medications:  . Vitamin B12 100 mcg daily . Multivitamin . Tylenol 500 mg PRN  We discussed:  ***  Plan  Continue {CHL HP Upstream Pharmacy Plans:2109141003}  Medication Management   Pt uses *** pharmacy for all medications Uses pill box? {Yes or If no, why not?:20788} Pt endorses ***% compliance  We discussed: ***  Plan  {US Pharmacy Plan:23885}    Follow up: *** month phone visit  ***  

## 2020-03-25 ENCOUNTER — Telehealth: Payer: Self-pay | Admitting: Internal Medicine

## 2020-03-25 ENCOUNTER — Ambulatory Visit (INDEPENDENT_AMBULATORY_CARE_PROVIDER_SITE_OTHER): Payer: Medicare Other | Admitting: *Deleted

## 2020-03-25 ENCOUNTER — Other Ambulatory Visit: Payer: Self-pay

## 2020-03-25 ENCOUNTER — Telehealth: Payer: Self-pay | Admitting: Emergency Medicine

## 2020-03-25 DIAGNOSIS — M810 Age-related osteoporosis without current pathological fracture: Secondary | ICD-10-CM

## 2020-03-25 MED ORDER — DENOSUMAB 60 MG/ML ~~LOC~~ SOSY
60.0000 mg | PREFILLED_SYRINGE | Freq: Once | SUBCUTANEOUS | Status: AC
  Administered 2020-03-25: 60 mg via SUBCUTANEOUS

## 2020-03-25 NOTE — Progress Notes (Signed)
Pls cosign for B12 inj../lmb  

## 2020-03-25 NOTE — Telephone Encounter (Signed)
We can try increasing this medication.  The only concern would be in May cause some more drowsiness during the night if she got up to go to the bathroom or first thing in the morning.  This could potentially increase her risk of falls.  Increase in the medication may help with any depression or sleep if she is not sleeping well.  Let me know if her daughter wants to try a higher dose and I will send it to the pharmacy.

## 2020-03-25 NOTE — Telephone Encounter (Signed)
Pts daughter came in and stated she thinks the patients mirtazapine (REMERON) 15 MG tablet needs to be increased. Are you ok doing so without an appt or would you like patient to make appt? Please advise thanks.

## 2020-03-25 NOTE — Addendum Note (Signed)
Addended by: Radford Pax M on: 03/25/2020 01:45 PM   Modules accepted: Orders

## 2020-03-25 NOTE — Telephone Encounter (Signed)
It is important since she is on insulin - I would be more concerned about hypoglycemia and elevated sugar

## 2020-03-25 NOTE — Telephone Encounter (Signed)
    Scarlette Ar from [facility] DSS conducting an investigation of a local nursing facility, Scarlette Ar states facility missed patients 6am blood sugar check on 7/3. She would like to know how crucial it is for this patient to have proper blood sugar testing  Phone  [phone]

## 2020-03-28 MED ORDER — MIRTAZAPINE 30 MG PO TABS: 30.0000 mg | ORAL_TABLET | Freq: Every day | ORAL | 1 refills | Status: AC

## 2020-03-28 NOTE — Telephone Encounter (Signed)
Spoke with Scarlette Ar and info given.

## 2020-04-01 ENCOUNTER — Telehealth: Payer: Self-pay | Admitting: Internal Medicine

## 2020-04-01 NOTE — Progress Notes (Signed)
  Chronic Care Management   Note  04/01/2020 Name: JENAFER WINTERTON MRN: [id number] DOB: [birth year]  LEKEISHA ARENAS is a 86 y.o. year old female who is a primary care patient of Burns, Bobette Mo, MD. I reached out to [name] by phone today in response to a referral sent by Ms. Harl Favor PCP, Pincus Sanes, MD.   Ms. Mihalic was given information about Chronic Care Management services today including:  1. CCM service includes personalized support from designated clinical staff supervised by her physician, including individualized plan of care and coordination with other care providers 2. 24/7 contact phone numbers for assistance for urgent and routine care needs. 3. Service will only be billed when office clinical staff spend 20 minutes or more in a month to coordinate care. 4. Only one practitioner may furnish and bill the service in a calendar month. 5. The patient may stop CCM services at any time (effective at the end of the month) by phone call to the office staff.   Patient agreed to services and verbal consent obtained.   Follow up plan:   Lynnae January Upstream Scheduler

## 2020-05-03 ENCOUNTER — Telehealth: Payer: Self-pay | Admitting: Emergency Medicine

## 2020-05-03 NOTE — Telephone Encounter (Signed)
Pts daughter dropped off FL2 forms. Left one filled out to use as a template and a blank form. Asked that you call her at [phone] when completed and ready for pick up. Placed in Brittany's box. Thanks.

## 2020-05-05 NOTE — Telephone Encounter (Signed)
Form has been completed &Placed in providers box to review and sign.  

## 2020-05-05 NOTE — Telephone Encounter (Signed)
Form has been signed, Copy sent to scan.   Daughter aware and will pick up original.

## 2020-05-17 ENCOUNTER — Telehealth: Payer: Self-pay | Admitting: Internal Medicine

## 2020-05-17 NOTE — Telephone Encounter (Signed)
Patients daughter dropped off a copy of FL2 form and was asking that boxes 12 and 13 be filled out. Form placed in Patricia Andrews's box.

## 2020-05-19 NOTE — Telephone Encounter (Signed)
LVM for Patricia Andrews to inform her 12 &13 are not something we have never completed before on a FL2. Dr.Burns and I believe this is something completed by the facility.  Form has been placed upfront to pick up. &To call back and ask for [place] if she has any questions.

## 2020-05-23 ENCOUNTER — Ambulatory Visit: Payer: Medicare Other | Admitting: Pharmacist

## 2020-05-23 ENCOUNTER — Other Ambulatory Visit: Payer: Self-pay

## 2020-05-23 DIAGNOSIS — I10 Essential (primary) hypertension: Secondary | ICD-10-CM

## 2020-05-23 DIAGNOSIS — E7849 Other hyperlipidemia: Secondary | ICD-10-CM

## 2020-05-23 DIAGNOSIS — F418 Other specified anxiety disorders: Secondary | ICD-10-CM

## 2020-05-23 DIAGNOSIS — E119 Type 2 diabetes mellitus without complications: Secondary | ICD-10-CM

## 2020-05-23 NOTE — Chronic Care Management (AMB) (Signed)
Chronic Care Management Pharmacy  Name: Patricia Andrews  MRN: [id number] DOB: 1926-02-05   Chief Complaint/ HPI  Patricia Andrews,  86 y.o. , female presents for their Initial CCM visit with the clinical pharmacist via telephone due to COVID-19 Pandemic.  PCP : Binnie Rail, MD Patient Care Team: Binnie Rail, MD as PCP - General (Internal Medicine) Clearnce Sorrel, MD (Neurology) Camelia Phenes, DPM (Inactive) (Podiatry) Inda Castle, MD (Inactive) (Gastroenterology) Binnie Rail, MD as Consulting Physician (Internal Medicine) Charlton Haws, [facility] as Pharmacist (Pharmacist)  Their chronic conditions include: Hypertension, Hyperlipidemia, Diabetes, Depression, Anxiety, Osteoporosis and Dementia , Hx CVA  Patient lives in [facility] assisted living. Her daughter [name] helps handle medications and receives updates on patient's health  Office Visits: 11/13/19 Dr Quay Burow OV: pressure sore on R heel - moisturize with vaseline and monitor.  09/14/19 Dr Quay Burow VV:  Ordered PT for balance, less active during COVID.   Consult Visit: n/a  No Known Allergies  Medications: Outpatient Encounter Medications as of 05/23/2020  Medication Sig  . acetaminophen (TYLENOL) 500 MG tablet Take 1 tablet (500 mg total) by mouth every 6 (six) hours as needed for mild pain.  Marland Kitchen atorvastatin (LIPITOR) 20 MG tablet TAKE 1 TABLET BY MOUTH  DAILY  . Blood Glucose Monitoring Suppl ([place]) w/Device KIT USE TO CHECK BLOOD SUGAR  TWO TIMES DAILY  . citalopram (CELEXA) 20 MG tablet TAKE 1 TABLET BY MOUTH  DAILY  . denosumab (PROLIA) 60 MG/ML SOLN injection Inject 60 mg into the skin every 6 (six) months. Administer in upper arm, thigh, or abdomen  Usually take on the 23rd of each month  . diltiazem (CARDIZEM CD) 300 MG 24 hr capsule TAKE 1 CAPSULE BY MOUTH  DAILY  . divalproex (DEPAKOTE) 125 MG DR tablet Take 125 mg by mouth at bedtime.  . donepezil (ARICEPT) 10 MG tablet TAKE 1  TABLET BY MOUTH AT  BEDTIME  . glucose blood (ONETOUCH VERIO) test strip CHECK BLOOD SUGAR TWO TIMES DAILY AS DIRECTED.  E11.9  . hydroxypropyl methylcellulose / hypromellose (ISOPTO TEARS / GONIOVISC) 2.5 % ophthalmic solution Place 1 drop into both eyes as needed for dry eyes (dry eyes).  . insulin lispro (HUMALOG KWIKPEN) 100 UNIT/ML KiwkPen Inject into the skin 3 (three) times daily. (Per sliding Scale) 151-200 = 2 units, 201-250 = 4 units, 251-300 = 6 units 301-350 = 8 units, 351-400 = 10 units  . Insulin Pen Needle 32G X 4 MM MISC Use to administer insulin four times a day Dx E11.9  . Lancets (ONETOUCH DELICA PLUS [id number]) MISC USE WITH LANCING DEVICE TO  CHECK BLOOD SUGAR TWO TIMES DAILY AS DIRECTED. E11.9  . LANTUS SOLOSTAR 100 UNIT/ML Solostar Pen INJECT SUBCUTANEOUSLY 10  UNITS DAILY  . memantine (NAMENDA) 5 MG tablet TAKE 1 TABLET BY MOUTH  TWICE DAILY  . mirtazapine (REMERON) 30 MG tablet Take 1 tablet (30 mg total) by mouth at bedtime.  . Misc. Devices (RAISED TOILET SEAT) MISC Use as directed  . Multiple Vitamin (MULTIVITAMIN) capsule Take 1 capsule by mouth daily.  Marland Kitchen olmesartan (BENICAR) 20 MG tablet TAKE 1 TABLET BY MOUTH  DAILY  . Omega-3 Fatty Acids (FISH OIL) 1000 MG CAPS Take 1 capsule by mouth every morning.  . vitamin B-12 (CYANOCOBALAMIN) 100 MCG tablet Take 100 mcg by mouth daily.  Marland Kitchen zolpidem (AMBIEN) 5 MG tablet Take 1 tablet (5 mg total) by mouth at bedtime.  No facility-administered encounter medications on file as of 05/23/2020.    Wt Readings from Last 3 Encounters:  11/13/19 124 lb (56.2 kg)  06/09/19 112 lb (50.8 kg)  09/11/18 122 lb 12.8 oz (55.7 kg)    Current Diagnosis/Assessment:  SDOH Interventions     Most Recent Value  SDOH Interventions  Financial Strain Interventions Intervention Not Indicated      Goals Addressed            This Visit's Progress   . Pharmacy Care Plan       CARE PLAN ENTRY (see longitudinal plan of care for  additional care plan information)  Current Barriers:  . Chronic Disease Management support, education, and care coordination needs related to Hypertension, Hyperlipidemia, Diabetes, and Depression   Hypertension BP Readings from Last 3 Encounters:  11/13/19 (!) 176/84  06/09/19 (!) 182/80  09/11/18 (!) 192/80 .  Pharmacist Clinical Goal(s): o Over the next 180 days, patient will work with PharmD and providers to maintain BP goal <140/90 . Current regimen:  o Diltiazem 300 mg daily o Olmesartan 20 mg daily . Interventions: o Discussed BP goals and benefits of medications for prevention of heart attack / stroke . Patient self care activities - Over the next 180 days, patient will: o Check BP daily, document, and provide at future appointments o Ensure daily salt intake < 2300 mg/day  Hyperlipidemia Lab Results  Component Value Date/Time   LDLCALC 64 06/09/2019 11:57 AM   LDLDIRECT 108.6 04/21/2013 10:10 AM .  Pharmacist Clinical Goal(s): o Over the next 180 days, patient will work with PharmD and providers to maintain LDL goal < 100 . Current regimen:  o Atorvastatin 20 mg daily . Interventions: o Discussed cholesterol goals and benefits of medications for prevention of heart attack / stroke . Patient self care activities - Over the next 180 days, patient will: o Continue medications as prescribed  Diabetes Lab Results  Component Value Date/Time   HGBA1C 7.3 (H) 06/09/2019 11:57 AM   HGBA1C 7.0 (H) 09/11/2018 01:55 PM .  Pharmacist Clinical Goal(s): o Over the next 180 days, patient will work with PharmD and providers to maintain A1c goal <8% . Current regimen:  . Lantus 10 units daily HS . Humalog per sliding scale o BG 151-200: 2 units o BG 201-250: 4 units o BG 251-300: 6 units o BG 301-350: 8 units o BG > 350: 10 units . Interventions: o Discussed blood sugar goals and benefits of medications for prevention of diabetic complications . Patient self care activities  - Over the next 180 days, patient will: o Check blood sugar twice daily, document, and provide at future appointments o Contact provider with any episodes of hypoglycemia  Depression / Anxiety . Pharmacist Clinical Goal(s) o Over the next 180 days, patient will work with PharmD and providers to optimize therapy . Current regimen:  o Citalopram 20 mg daily o Mirtazapine 30 mg HS o Divalproex DR 125 mg daily . Interventions: o Discussed benefits and risks of new medication, divalproex . Patient self care activities - Over the next 180 days, patient will: o Continue current medications  Medication management . Pharmacist Clinical Goal(s): o Over the next 180 days, patient will work with PharmD and providers to maintain optimal medication adherence . Current pharmacy: [facility] . Interventions o Comprehensive medication review performed. o Continue current medication management strategy . Patient self care activities - Over the next 180 days, patient will: o Focus on medication adherence by fill  date o Take medications as prescribed o Report any questions or concerns to PharmD and/or provider(s)  Initial goal documentation       Hypertension   BP goal is:  <140/90  Office blood pressures are  BP Readings from Last 3 Encounters:  11/13/19 (!) 176/84  06/09/19 (!) 182/80  09/11/18 (!) 192/80   Kidney Function Lab Results  Component Value Date/Time   CREATININE 1.31 (H) 06/09/2019 11:57 AM   CREATININE 1.26 (H) 09/11/2018 01:55 PM   CREATININE 1.6 (H) 09/18/2016 04:03 PM   GFR 46.01 (L) 06/09/2019 11:57 AM   GFRNONAA 30 (L) 07/17/2016 05:56 PM   GFRAA 35 (L) 07/17/2016 05:56 PM   K 3.9 06/09/2019 11:57 AM   K 4.5 09/11/2018 01:55 PM   K 4.6 09/18/2016 04:03 PM   Patient checks BP at home daily Patient home BP readings are ranging: "good"  Patient has failed these meds in the past: n/a Patient is currently controlled on the following medications:   . Diltiazem 300 mg daily . Olmesartan 20 mg daily  We discussed: BP goals, benefits of medications; pt lives in assisted living and has BP checked daily   Plan  Continue current medications     Hyperlipidemia   LDL goal < 100  Lipid Panel     Component Value Date/Time   CHOL 160 06/09/2019 1157   TRIG 66.0 06/09/2019 1157   HDL 82.10 06/09/2019 1157   LDLCALC 64 06/09/2019 1157   LDLDIRECT 108.6 04/21/2013 1010    Hepatic Function Latest Ref Rng & Units 06/09/2019 09/11/2018 02/10/2018  Total Protein 6.0 - 8.3 g/dL 7.5 7.5 7.7  Albumin 3.5 - 5.2 g/dL 4.2 4.2 4.2  AST 0 - 37 U/L 15 18 19   ALT 0 - 35 U/L 11 14 15   Alk Phosphatase 39 - 117 U/L 102 69 70  Total Bilirubin 0.2 - 1.2 mg/dL 0.4 0.4 0.3  Bilirubin, Direct 0.0 - 0.3 mg/dL - - -    The ASCVD Risk score (Palmer., et al., 2013) failed to calculate for the following reasons:   The 2013 ASCVD risk score is only valid for ages 12 to 57   Patient has failed these meds in past: n/a Patient is currently controlled on the following medications:  . Atorvastatin 20 mg daily HS  We discussed:  Cholesterol goals; benefits of statin for ASCVD risk reduction  Plan  Continue current medications  Diabetes   A1c goal <8%  Recent Relevant Labs: Lab Results  Component Value Date/Time   HGBA1C 7.3 (H) 06/09/2019 11:57 AM   HGBA1C 7.0 (H) 09/11/2018 01:55 PM   GFR 46.01 (L) 06/09/2019 11:57 AM   GFR 48.20 (L) 09/11/2018 01:55 PM   MICROALBUR 3.1 (H) 04/13/2015 12:36 PM   MICROALBUR 2.6 (H) 12/02/2014 10:23 AM    Last diabetic Eye exam:  Lab Results  Component Value Date/Time   HMDIABEYEEXA No Retinopathy 02/25/2015 12:00 AM    Last diabetic Foot exam:  Lab Results  Component Value Date/Time   HMDIABFOOTEX done podiatry q22mo08/17/2016 12:00 AM     Checking BG: 2x per Day at  5am and 4pm (before breakfast and dinner);   Patient has failed these meds in past: n/a Patient is currently controlled on the  following medications: . Lantus 10 units daily HS . Humalog per sliding scale o BG 151-200: 2 units o BG 201-250: 4 units o BG 251-300: 6 units o BG 301-350: 8 units o BG > 350: 10  units  We discussed: per daughter BG has been stable and well controlled with current regimen; denies episodes of hyper- or hypoglycemia  Plan  Continue current medications  Osteopenia / Osteoporosis   Last DEXA Scan: 09/28/2013  T-Score femoral neck: -2.8  VITD  Date Value Ref Range Status  06/09/2019 97.47 30.00 - 100.00 ng/mL Final     Patient is a candidate for pharmacologic treatment due to T-Score < -2.5 in femoral neck  Patient has failed these meds in past: n/a Patient is currently controlled on the following medications:  . Prolia (last given 03/25/20) . Calcium . Vitamin D  We discussed:  Recommend [phone] units of vitamin D daily. Recommend 1200 mg of calcium daily from dietary and supplemental sources.; pt is up to date on Prolia injections, next one due January 2022  Plan  Continue current medications  Depression   Depression screen [facility] 2/9 02/18/2018 02/10/2018 04/07/2016  Decreased Interest 1 1 0  Down, Depressed, Hopeless 2 0 0  PHQ - 2 Score 3 1 0  Altered sleeping 0 - -  Tired, decreased energy 1 - -  Change in appetite 0 - -  Feeling bad or failure about yourself  1 - -  Trouble concentrating 0 - -  Moving slowly or fidgety/restless 0 - -  Suicidal thoughts 0 - -  PHQ-9 Score 5 - -  Difficult doing work/chores Not difficult at all - -   Patient has failed these meds in past: n/a Patient is currently controlled on the following medications:  . Citalopram 20 mg daily . Donepezil 10 mg daily HS . Memantine 5 mg BID . Mirtazapine 30 mg HS . Zolpidem 5 mg HS . Divalproex DR 125 mg daily  We discussed:  Mirtazapine was increased to 30 mg in July and Depakote added by therapist in August for anxiety. Per daughter a mild improvement in symptoms was  noted  Plan  Continue current medications  Health Maintenance   Lab Results  Component Value Date/Time   VITAMINB12 2,765 (H) 07/17/2016 08:18 PM   VITAMINB12 >1500 (H) 01/16/2016 12:23 PM   Patient is currently controlled on the following medications:  Marland Kitchen Vitamin B12 100 mcg . Multivitamin . Tylenol 500 mg q6h PRN  We discussed:  Patient is satisfied with current OTC regimen and denies issues; discussed last B12 level was above goal, may reduce to every other day or weekly dosing   Plan  Continue current medications   Medication Management   Pt uses [facility] for all medications Uses pill box? No - meds administered by ALF caregivers Pt endorses 100% compliance  We discussed: Current pharmacy is preferred with insurance plan and patient is satisfied with pharmacy services  Plan  Continue current medication management strategy    Follow up: as needed  Charlene Brooke, PharmD, BCACP Clinical Pharmacist [facility] [phone]

## 2020-05-24 NOTE — Patient Instructions (Addendum)
Visit Information  Phone number for Pharmacist: [phone]  Thank you for meeting with me to discuss your medications! I look forward to working with you to achieve your health care goals. Below is a summary of what we talked about during the visit:  Goals Addressed            This Visit's Progress   . Pharmacy Care Plan       CARE PLAN ENTRY (see longitudinal plan of care for additional care plan information)  Current Barriers:  . Chronic Disease Management support, education, and care coordination needs related to Hypertension, Hyperlipidemia, Diabetes, and Depression   Hypertension BP Readings from Last 3 Encounters:  11/13/19 (!) 176/84  06/09/19 (!) 182/80  09/11/18 (!) 192/80 .  Pharmacist Clinical Goal(s): o Over the next 180 days, patient will work with PharmD and providers to maintain BP goal <140/90 . Current regimen:  o Diltiazem 300 mg daily o Olmesartan 20 mg daily . Interventions: o Discussed BP goals and benefits of medications for prevention of heart attack / stroke . Patient self care activities - Over the next 180 days, patient will: o Check BP daily, document, and provide at future appointments o Ensure daily salt intake < 2300 mg/day  Hyperlipidemia Lab Results  Component Value Date/Time   LDLCALC 64 06/09/2019 11:57 AM   LDLDIRECT 108.6 04/21/2013 10:10 AM .  Pharmacist Clinical Goal(s): o Over the next 180 days, patient will work with PharmD and providers to maintain LDL goal < 100 . Current regimen:  o Atorvastatin 20 mg daily . Interventions: o Discussed cholesterol goals and benefits of medications for prevention of heart attack / stroke . Patient self care activities - Over the next 180 days, patient will: o Continue medications as prescribed  Diabetes Lab Results  Component Value Date/Time   HGBA1C 7.3 (H) 06/09/2019 11:57 AM   HGBA1C 7.0 (H) 09/11/2018 01:55 PM .  Pharmacist Clinical Goal(s): o Over the next 180 days, patient will  work with PharmD and providers to maintain A1c goal <8% . Current regimen:  . Lantus 10 units daily HS . Humalog per sliding scale o BG 151-200: 2 units o BG 201-250: 4 units o BG 251-300: 6 units o BG 301-350: 8 units o BG > 350: 10 units . Interventions: o Discussed blood sugar goals and benefits of medications for prevention of diabetic complications . Patient self care activities - Over the next 180 days, patient will: o Check blood sugar twice daily, document, and provide at future appointments o Contact provider with any episodes of hypoglycemia  Depression / Anxiety . Pharmacist Clinical Goal(s) o Over the next 180 days, patient will work with PharmD and providers to optimize therapy . Current regimen:  o Citalopram 20 mg daily o Mirtazapine 30 mg HS o Divalproex DR 125 mg daily . Interventions: o Discussed benefits and risks of new medication, divalproex . Patient self care activities - Over the next 180 days, patient will: o Continue current medications  Medication management . Pharmacist Clinical Goal(s): o Over the next 180 days, patient will work with PharmD and providers to maintain optimal medication adherence . [facility] . Interventions o Comprehensive medication review performed. o Continue current medication management strategy . Patient self care activities - Over the next 180 days, patient will: o Focus on medication adherence by fill date o Take medications as prescribed o Report any questions or concerns to PharmD and/or provider(s)  Initial goal documentation  Patricia Andrews was given information about Chronic Care Management services today including:  1. CCM service includes personalized support from designated clinical staff supervised by her physician, including individualized plan of care and coordination with other care providers 2. 24/7 contact phone numbers for assistance for urgent and routine care  needs. 3. Standard insurance, coinsurance, copays and deductibles apply for chronic care management only during months in which we provide at least 20 minutes of these services. Most insurances cover these services at 100%, however patients may be responsible for any copay, coinsurance and/or deductible if applicable. This service may help you avoid the need for more expensive face-to-face services. 4. Only one practitioner may furnish and bill the service in a calendar month. 5. The patient may stop CCM services at any time (effective at the end of the month) by phone call to the office staff.  Patient agreed to services and verbal consent obtained.   Patient verbalizes understanding of instructions provided today.  The pharmacy team will reach out to the patient again over the next 180 days.   Al Corpus, PharmD, BCACP Clinical Pharmacist [facility] [phone]  Health Maintenance for Postmenopausal Women Menopause is a normal process in which your ability to get pregnant comes to an end. This process happens slowly over many months or years, usually between the ages of 56 and 49. Menopause is complete when you have missed your menstrual periods for 12 months. It is important to talk with your health care provider about some of the most common conditions that affect women after menopause (postmenopausal women). These include heart disease, cancer, and bone loss (osteoporosis). Adopting a healthy lifestyle and getting preventive care can help to promote your health and wellness. The actions you take can also lower your chances of developing some of these common conditions. What should I know about menopause? During menopause, you may get a number of symptoms, such as:  Hot flashes. These can be moderate or severe.  Night sweats.  Decrease in sex drive.  Mood swings.  Headaches.  Tiredness.  Irritability.  Memory problems.  Insomnia. Choosing to  treat or not to treat these symptoms is a decision that you make with your health care provider. Do I need hormone replacement therapy?  Hormone replacement therapy is effective in treating symptoms that are caused by menopause, such as hot flashes and night sweats.  Hormone replacement carries certain risks, especially as you become older. If you are thinking about using estrogen or estrogen with progestin, discuss the benefits and risks with your health care provider. What is my risk for heart disease and stroke? The risk of heart disease, heart attack, and stroke increases as you age. One of the causes may be a change in the body's hormones during menopause. This can affect how your body uses dietary fats, triglycerides, and cholesterol. Heart attack and stroke are medical emergencies. There are many things that you can do to help prevent heart disease and stroke. Watch your blood pressure  High blood pressure causes heart disease and increases the risk of stroke. This is more likely to develop in people who have high blood pressure readings, are of African descent, or are overweight.  Have your blood pressure checked: ? Every 3-5 years if you are 27-73 years of age. ? Every year if you are 47 years old or older. Eat a healthy diet   Eat a diet that includes plenty of vegetables, fruits, low-fat dairy products, and lean protein.  Do not eat a lot of foods that are high in solid fats, added sugars, or sodium. Get regular exercise Get regular exercise. This is one of the most important things you can do for your health. Most adults should:  Try to exercise for at least 150 minutes each week. The exercise should increase your heart rate and make you sweat (moderate-intensity exercise).  Try to do strengthening exercises at least twice each week. Do these in addition to the moderate-intensity exercise.  Spend less time sitting. Even light physical activity can be beneficial. Other  tips  Work with your health care provider to achieve or maintain a healthy weight.  Do not use any products that contain nicotine or tobacco, such as cigarettes, e-cigarettes, and chewing tobacco. If you need help quitting, ask your health care provider.  Know your numbers. Ask your health care provider to check your cholesterol and your blood sugar (glucose). Continue to have your blood tested as directed by your health care provider. Do I need screening for cancer? Depending on your health history and family history, you may need to have cancer screening at different stages of your life. This may include screening for:  Breast cancer.  Cervical cancer.  Lung cancer.  Colorectal cancer. What is my risk for osteoporosis? After menopause, you may be at increased risk for osteoporosis. Osteoporosis is a condition in which bone destruction happens more quickly than new bone creation. To help prevent osteoporosis or the bone fractures that can happen because of osteoporosis, you may take the following actions:  If you are 52-34 years old, get at least 1,000 mg of calcium and at least 600 mg of vitamin D per day.  If you are older than age 60 but younger than age 34, get at least 1,200 mg of calcium and at least 600 mg of vitamin D per day.  If you are older than age 11, get at least 1,200 mg of calcium and at least 800 mg of vitamin D per day. Smoking and drinking excessive alcohol increase the risk of osteoporosis. Eat foods that are rich in calcium and vitamin D, and do weight-bearing exercises several times each week as directed by your health care provider. How does menopause affect my mental health? Depression may occur at any age, but it is more common as you become older. Common symptoms of depression include:  Low or sad mood.  Changes in sleep patterns.  Changes in appetite or eating patterns.  Feeling an overall lack of motivation or enjoyment of activities that you  previously enjoyed.  Frequent crying spells. Talk with your health care provider if you think that you are experiencing depression. General instructions See your health care provider for regular wellness exams and vaccines. This may include:  Scheduling regular health, dental, and eye exams.  Getting and maintaining your vaccines. These include: ? Influenza vaccine. Get this vaccine each year before the flu season begins. ? Pneumonia vaccine. ? Shingles vaccine. ? Tetanus, diphtheria, and pertussis (Tdap) booster vaccine. Your health care provider may also recommend other immunizations. Tell your health care provider if you have ever been abused or do not feel safe at home. Summary  Menopause is a normal process in which your ability to get pregnant comes to an end.  This condition causes hot flashes, night sweats, decreased interest in sex, mood swings, headaches, or lack of sleep.  Treatment for this condition may include hormone replacement therapy.  Take actions to keep yourself healthy, including exercising  regularly, eating a healthy diet, watching your weight, and checking your blood pressure and blood sugar levels.  Get screened for cancer and depression. Make sure that you are up to date with all your vaccines. This information is not intended to replace advice given to you by your health care provider. Make sure you discuss any questions you have with your health care provider. Document Revised: 08/06/2018 Document Reviewed: 08/06/2018 Elsevier Patient Education  2020 [employer].

## 2020-08-25 DIAGNOSIS — Z03818 Encounter for observation for suspected exposure to other biological agents ruled out: Secondary | ICD-10-CM | POA: Diagnosis not present

## 2020-09-08 DIAGNOSIS — Z03818 Encounter for observation for suspected exposure to other biological agents ruled out: Secondary | ICD-10-CM | POA: Diagnosis not present

## 2020-09-15 DIAGNOSIS — Z03818 Encounter for observation for suspected exposure to other biological agents ruled out: Secondary | ICD-10-CM | POA: Diagnosis not present

## 2020-10-03 ENCOUNTER — Telehealth: Payer: Self-pay | Admitting: Internal Medicine

## 2020-10-03 NOTE — Telephone Encounter (Signed)
BVI completed on 1.14.22  $250 copay due lvm on [phone] for [facility]

## 2020-10-03 NOTE — Telephone Encounter (Signed)
   Prolia injection scheduled for 2/11 Daughter scheduled appointment.  Is there anything additional needed

## 2020-10-07 ENCOUNTER — Ambulatory Visit (INDEPENDENT_AMBULATORY_CARE_PROVIDER_SITE_OTHER): Payer: Medicare Other

## 2020-10-07 ENCOUNTER — Other Ambulatory Visit: Payer: Self-pay

## 2020-10-07 DIAGNOSIS — M810 Age-related osteoporosis without current pathological fracture: Secondary | ICD-10-CM | POA: Diagnosis not present

## 2020-10-07 MED ORDER — DENOSUMAB 60 MG/ML ~~LOC~~ SOSY
60.0000 mg | PREFILLED_SYRINGE | Freq: Once | SUBCUTANEOUS | Status: AC
  Administered 2020-10-07: 60 mg via SUBCUTANEOUS

## 2020-10-07 NOTE — Progress Notes (Signed)
Patient seen in office and given 1st Prolia injection today per Dr. Lawerance Bach recommendations.

## 2020-10-15 NOTE — Progress Notes (Signed)
prolia Injection given.   Maurio Baize J Roxie Gueye, MD  

## 2020-10-17 ENCOUNTER — Emergency Department (HOSPITAL_COMMUNITY): Payer: Medicare Other

## 2020-10-17 ENCOUNTER — Encounter (HOSPITAL_COMMUNITY): Payer: Self-pay | Admitting: Emergency Medicine

## 2020-10-17 ENCOUNTER — Emergency Department (HOSPITAL_COMMUNITY)
Admission: EM | Admit: 2020-10-17 | Discharge: 2020-10-19 | Disposition: A | Discharge status: Home / Self Care | Payer: Medicare Other | Attending: Emergency Medicine | Admitting: Emergency Medicine

## 2020-10-17 ENCOUNTER — Other Ambulatory Visit: Payer: Self-pay

## 2020-10-17 DIAGNOSIS — M47812 Spondylosis without myelopathy or radiculopathy, cervical region: Secondary | ICD-10-CM | POA: Diagnosis not present

## 2020-10-17 DIAGNOSIS — M542 Cervicalgia: Secondary | ICD-10-CM | POA: Insufficient documentation

## 2020-10-17 DIAGNOSIS — I10 Essential (primary) hypertension: Secondary | ICD-10-CM | POA: Diagnosis not present

## 2020-10-17 DIAGNOSIS — W19XXXA Unspecified fall, initial encounter: Secondary | ICD-10-CM | POA: Diagnosis not present

## 2020-10-17 DIAGNOSIS — Z79899 Other long term (current) drug therapy: Secondary | ICD-10-CM | POA: Insufficient documentation

## 2020-10-17 DIAGNOSIS — Z743 Need for continuous supervision: Secondary | ICD-10-CM | POA: Diagnosis not present

## 2020-10-17 DIAGNOSIS — E119 Type 2 diabetes mellitus without complications: Secondary | ICD-10-CM | POA: Diagnosis not present

## 2020-10-17 DIAGNOSIS — S2242XA Multiple fractures of ribs, left side, initial encounter for closed fracture: Secondary | ICD-10-CM | POA: Diagnosis not present

## 2020-10-17 DIAGNOSIS — Z794 Long term (current) use of insulin: Secondary | ICD-10-CM | POA: Insufficient documentation

## 2020-10-17 DIAGNOSIS — F0390 Unspecified dementia without behavioral disturbance: Secondary | ICD-10-CM | POA: Diagnosis not present

## 2020-10-17 DIAGNOSIS — Z20822 Contact with and (suspected) exposure to covid-19: Secondary | ICD-10-CM | POA: Insufficient documentation

## 2020-10-17 DIAGNOSIS — M545 Low back pain, unspecified: Secondary | ICD-10-CM | POA: Insufficient documentation

## 2020-10-17 DIAGNOSIS — S0990XA Unspecified injury of head, initial encounter: Secondary | ICD-10-CM | POA: Diagnosis not present

## 2020-10-17 DIAGNOSIS — G319 Degenerative disease of nervous system, unspecified: Secondary | ICD-10-CM | POA: Diagnosis not present

## 2020-10-17 DIAGNOSIS — M546 Pain in thoracic spine: Secondary | ICD-10-CM | POA: Diagnosis not present

## 2020-10-17 DIAGNOSIS — S2232XA Fracture of one rib, left side, initial encounter for closed fracture: Secondary | ICD-10-CM | POA: Diagnosis not present

## 2020-10-17 DIAGNOSIS — R590 Localized enlarged lymph nodes: Secondary | ICD-10-CM | POA: Diagnosis not present

## 2020-10-17 DIAGNOSIS — I6529 Occlusion and stenosis of unspecified carotid artery: Secondary | ICD-10-CM | POA: Diagnosis not present

## 2020-10-17 DIAGNOSIS — G4489 Other headache syndrome: Secondary | ICD-10-CM | POA: Diagnosis not present

## 2020-10-17 DIAGNOSIS — R6889 Other general symptoms and signs: Secondary | ICD-10-CM | POA: Diagnosis not present

## 2020-10-17 DIAGNOSIS — M25512 Pain in left shoulder: Secondary | ICD-10-CM | POA: Diagnosis not present

## 2020-10-17 DIAGNOSIS — M4312 Spondylolisthesis, cervical region: Secondary | ICD-10-CM | POA: Diagnosis not present

## 2020-10-17 DIAGNOSIS — S199XXA Unspecified injury of neck, initial encounter: Secondary | ICD-10-CM | POA: Diagnosis not present

## 2020-10-17 DIAGNOSIS — R609 Edema, unspecified: Secondary | ICD-10-CM | POA: Diagnosis not present

## 2020-10-17 DIAGNOSIS — R0789 Other chest pain: Secondary | ICD-10-CM | POA: Diagnosis not present

## 2020-10-17 DIAGNOSIS — I700 Atherosclerosis of aorta: Secondary | ICD-10-CM | POA: Diagnosis not present

## 2020-10-17 MED ORDER — FENTANYL CITRATE (PF) 100 MCG/2ML IJ SOLN
50.0000 ug | Freq: Once | INTRAMUSCULAR | Status: AC
  Administered 2020-10-17: 50 ug via INTRAVENOUS
  Filled 2020-10-17: qty 2

## 2020-10-17 NOTE — ED Provider Notes (Incomplete)
[facility] DEPT Provider Note   CSN: [id number] Arrival date & time: 10/17/20  2146     History Chief Complaint  Patient presents with  . Fall  . Head Injury    Patricia Andrews is a 87 y.o. female.  HPI      87 year old female with history of hypertension, hyperlipidemia, diabetes, CVA with left-sided weakness who resides in assisted living, presents with concern for fall.  Reports she did not have syncope or loss of consciousness. She is otherwise in normal state of health, no chest pain, no dyspnea, no cough, no fever, no nausea, vomiting, diarrhea, black or bloody stools, new numbness or weakness.  Reports hitting the left side of her head and having pain to the left side of her head as well as pain to her left side of her back.  Reports that the pain is severe with movement.  Denies shortness of breath   Past Medical History:  Diagnosis Date  . Allergic rhinitis   . Anemia   . Arthritis   . Depression    anxiety overlap  . Diabetes mellitus, type 2 ([place])   . Distal radius fracture   . Hyperlipidemia   . Hypertension   . Osteoporosis, senile    accidental fall w/ pelvic fx 2006, L wrist fx 10/2013  . Pelvis fracture ([place])   . Right wrist fracture 05/14/2015  . Stroke [facility]) 1979   R MCA>>residual spastic L HP; TIA 06/2011    Patient Active Problem List   Diagnosis Date Noted  . Pressure sore on heel, right, stage I 11/13/2019  . Insomnia 09/10/2018  . Sensorineural hearing loss 02/10/2017  . Dyspnea on exertion 12/31/2016  . Dementia ([place]) 12/30/2016  . Hyperkalemia 07/24/2016  . Poor balance 07/11/2016  . Upper back pain 07/11/2016  . Pain due to onychomycosis of toenail 02/04/2013  . Diabetes type 2, controlled ([place]) 09/20/2011  . Hypertension 09/20/2011  . Hyperlipidemia 09/20/2011  . Senile osteoporosis 09/20/2011  . Depression with anxiety 09/20/2011  . Anemia 09/20/2011  . CVA, old, hemiparesis ([place])     Past  Surgical History:  Procedure Laterality Date  . ABDOMINAL HYSTERECTOMY  1960   Fibroid tumors  . COLONOSCOPY  06/06/2012   Procedure: COLONOSCOPY;  Surgeon: Inda Castle, MD;  Location: [facility] ENDOSCOPY;  Service: Endoscopy;  Laterality: N/A;  Rm 1501. Pt to be discharged after procedure. Patient was only admitted for prep.     OB History   No obstetric history on file.     Family History  Problem Relation Age of Onset  . Rheum arthritis Mother     Social History   Tobacco Use  . Smoking status: Never Smoker  . Smokeless tobacco: Never Used  Vaping Use  . Vaping Use: Never used  Substance Use Topics  . Alcohol use: No    Alcohol/week: 0.0 standard drinks  . Drug use: No    Home Medications Prior to Admission medications   Medication Sig Start Date End Date Taking? Authorizing Provider  acetaminophen (TYLENOL) 500 MG tablet Take 1 tablet (500 mg total) by mouth every 6 (six) hours as needed for mild pain. 07/07/19   Binnie Rail, MD  atorvastatin (LIPITOR) 20 MG tablet TAKE 1 TABLET BY MOUTH  DAILY 07/15/19   Binnie Rail, MD  Blood Glucose Monitoring Suppl ([place]) w/Device KIT USE TO CHECK BLOOD SUGAR  TWO TIMES DAILY 02/18/20   Binnie Rail, MD  citalopram (CELEXA) 20 MG tablet TAKE 1 TABLET BY MOUTH  DAILY 02/24/19   Binnie Rail, MD  denosumab (PROLIA) 60 MG/ML SOLN injection Inject 60 mg into the skin every 6 (six) months. Administer in upper arm, thigh, or abdomen  Usually take on the 23rd of each month    [provider]  diltiazem (CARDIZEM CD) 300 MG 24 hr capsule TAKE 1 CAPSULE BY MOUTH  DAILY 01/20/19   Binnie Rail, MD  divalproex (DEPAKOTE) 125 MG DR tablet Take 125 mg by mouth at bedtime. 05/20/20   [provider]  donepezil (ARICEPT) 10 MG tablet TAKE 1 TABLET BY MOUTH AT  BEDTIME 09/29/18   Burns, Claudina Lick, MD  glucose blood (ONETOUCH VERIO) test strip CHECK BLOOD SUGAR TWO TIMES DAILY AS DIRECTED.  E11.9 02/18/20    Binnie Rail, MD  hydroxypropyl methylcellulose / hypromellose (ISOPTO TEARS / GONIOVISC) 2.5 % ophthalmic solution Place 1 drop into both eyes as needed for dry eyes (dry eyes).    [provider]  insulin lispro (HUMALOG KWIKPEN) 100 UNIT/ML KiwkPen Inject into the skin 3 (three) times daily. (Per sliding Scale) 151-200 = 2 units, 201-250 = 4 units, 251-300 = 6 units 301-350 = 8 units, 351-400 = 10 units    [provider]  Insulin Pen Needle 32G X 4 MM MISC Use to administer insulin four times a day Dx E11.9 02/01/16   Binnie Rail, MD  Lancets (ONETOUCH DELICA PLUS [id number]) MISC USE WITH LANCING DEVICE TO  CHECK BLOOD SUGAR TWO TIMES DAILY AS DIRECTED. E11.9 02/18/20   Binnie Rail, MD  LANTUS SOLOSTAR 100 UNIT/ML Solostar Pen INJECT SUBCUTANEOUSLY 10  UNITS DAILY 09/29/18   Binnie Rail, MD  memantine (NAMENDA) 5 MG tablet TAKE 1 TABLET BY MOUTH  TWICE DAILY 06/10/19   Binnie Rail, MD  mirtazapine (REMERON) 30 MG tablet Take 1 tablet (30 mg total) by mouth at bedtime. 03/28/20   Binnie Rail, MD  Misc. Devices (RAISED TOILET SEAT) MISC Use as directed 07/07/19   Binnie Rail, MD  Multiple Vitamin (MULTIVITAMIN) capsule Take 1 capsule by mouth daily. 09/14/19   Binnie Rail, MD  olmesartan (BENICAR) 20 MG tablet TAKE 1 TABLET BY MOUTH  DAILY 02/24/19   Burns, Claudina Lick, MD  Omega-3 Fatty Acids (FISH OIL) 1000 MG CAPS Take 1 capsule by mouth every morning. 11/13/19   Binnie Rail, MD  vitamin B-12 (CYANOCOBALAMIN) 100 MCG tablet Take 100 mcg by mouth daily.    [provider]  zolpidem (AMBIEN) 5 MG tablet Take 1 tablet (5 mg total) by mouth at bedtime. 11/13/19   Binnie Rail, MD    Allergies    Patient has no known allergies.  Review of Systems   Review of Systems  Constitutional: Negative for fever.  HENT: Negative for sore throat.   Eyes: Negative for visual disturbance.  Respiratory: Negative for cough and shortness of breath.   Cardiovascular:  Positive for chest pain.  Gastrointestinal: Negative for abdominal pain, diarrhea, nausea and vomiting.  Genitourinary: Negative for difficulty urinating.  Musculoskeletal: Positive for back pain. Negative for neck pain.  Skin: Negative for rash.  Neurological: Negative for syncope and headaches.    Physical Exam Updated Vital Signs BP (!) 170/64   Pulse 66   Temp 98 F (36.7 C) (Oral)   Resp 18   SpO2 99%   Physical Exam Vitals and nursing note reviewed.  Constitutional:  General: She is not in acute distress.    Appearance: She is well-developed and well-nourished. She is not diaphoretic.  HENT:     Head: Normocephalic and atraumatic.  Eyes:     Extraocular Movements: EOM normal.     Conjunctiva/sclera: Conjunctivae normal.  Cardiovascular:     Rate and Rhythm: Normal rate and regular rhythm.     Pulses: Intact distal pulses.     Heart sounds: Normal heart sounds. No murmur heard. No friction rub. No gallop.   Pulmonary:     Effort: Pulmonary effort is normal. No respiratory distress.     Breath sounds: Normal breath sounds. No wheezing or rales.  Chest:     Chest wall: Tenderness present.  Abdominal:     General: There is no distension.     Palpations: Abdomen is soft.     Tenderness: There is no abdominal tenderness. There is no guarding.  Musculoskeletal:        General: No tenderness or edema.     Cervical back: Normal range of motion.  Skin:    General: Skin is warm and dry.     Findings: No erythema or rash.  Neurological:     Mental Status: She is alert and oriented to person, place, and time.     ED Results / Procedures / Treatments   Labs (all labs ordered are listed, but only abnormal results are displayed) Labs Reviewed - No data to display  EKG None  Radiology CT Head Wo Contrast  Result Date: 10/17/2020 CLINICAL DATA:  Head trauma EXAM: CT HEAD WITHOUT CONTRAST CT CERVICAL SPINE WITHOUT CONTRAST TECHNIQUE: Multidetector CT imaging of  the head and cervical spine was performed following the standard protocol without intravenous contrast. Multiplanar CT image reconstructions of the cervical spine were also generated. COMPARISON:  CT brain and cervical spine 07/08/2016 FINDINGS: CT HEAD FINDINGS Brain: No acute territorial infarction, hemorrhage, or intracranial mass. Chronic right MCA infarct as before. Moderate white matter hypodensity consistent with chronic small vessel ischemic change. Moderate atrophy. Stable ventricle size. Vascular: No hyperdense vessels.  Carotid vascular calcification Skull: Normal. Negative for fracture or focal lesion. Sinuses/Orbits: No acute finding. Other: Moderate left frontotemporal scalp hematoma. CT CERVICAL SPINE FINDINGS Alignment: Reversal of cervical lordosis. Trace anterolisthesis C3 on C4. Facet alignment is maintained. Skull base and vertebrae: No acute fracture. No primary bone lesion or focal pathologic process. Soft tissues and spinal canal: Normal prevertebral soft tissue thickness. No visible canal hematoma Disc levels: Multiple level degenerative change, most advanced C4 through C7 where there is disc space narrowing, osteophyte and endplate changes. Facet degenerative changes at multiple levels. Upper chest: Negative. Other: None IMPRESSION: 1. No CT evidence for acute intracranial abnormality. Chronic right MCA infarct. Atrophy and chronic small vessel ischemic changes of the white matter. Moderate left frontotemporal scalp hematoma. 2. Reversal of cervical lordosis with degenerative changes. No acute osseous abnormality. Electronically Signed   By: Donavan Foil M.D.   On: 10/17/2020 23:23   CT Cervical Spine Wo Contrast  Result Date: 10/17/2020 CLINICAL DATA:  Head trauma EXAM: CT HEAD WITHOUT CONTRAST CT CERVICAL SPINE WITHOUT CONTRAST TECHNIQUE: Multidetector CT imaging of the head and cervical spine was performed following the standard protocol without intravenous contrast. Multiplanar CT  image reconstructions of the cervical spine were also generated. COMPARISON:  CT brain and cervical spine 07/08/2016 FINDINGS: CT HEAD FINDINGS Brain: No acute territorial infarction, hemorrhage, or intracranial mass. Chronic right MCA infarct as before. Moderate white matter hypodensity  consistent with chronic small vessel ischemic change. Moderate atrophy. Stable ventricle size. Vascular: No hyperdense vessels. Carotid vascular calcification Skull: Normal. Negative for fracture or focal lesion. Sinuses/Orbits: No acute finding. Other: Moderate left frontotemporal scalp hematoma. CT CERVICAL SPINE FINDINGS Alignment: Reversal of cervical lordosis. Trace anterolisthesis C3 on C4. Facet alignment is maintained. Skull base and vertebrae: No acute fracture. No primary bone lesion or focal pathologic process. Soft tissues and spinal canal: Normal prevertebral soft tissue thickness. No visible canal hematoma Disc levels: Multiple level degenerative change, most advanced C4 through C7 where there is disc space narrowing, osteophyte and endplate changes. Facet degenerative changes at multiple levels. Upper chest: Negative. Other: None IMPRESSION: 1. No CT evidence for acute intracranial abnormality. Chronic right MCA infarct. Atrophy and chronic small vessel ischemic changes of the white matter. Moderate left frontotemporal scalp hematoma. 2. Reversal of cervical lordosis with degenerative changes. No acute osseous abnormality. Electronically Signed   By: Donavan Foil M.D.   On: 10/17/2020 23:24    Procedures Procedures {Remember to document critical care time when appropriate:1}  Medications Ordered in ED Medications  fentaNYL (SUBLIMAZE) injection 50 mcg (50 mcg Intravenous Given 10/17/20 2338)    ED Course  I have reviewed the triage vital signs and the nursing notes.  Pertinent labs & imaging results that were available during my care of the patient were reviewed by me and considered in my medical  decision making (see chart for details).    MDM Rules/Calculators/A&P                           87 year old female with history of hypertension, hyperlipidemia, diabetes, CVA with left-sided weakness who resides in assisted living, presents with concern for fall. Final Clinical Impression(s) / ED Diagnoses Final diagnoses:  None    Rx / DC Orders ED Discharge Orders    None

## 2020-10-17 NOTE — ED Provider Notes (Signed)
[facility] DEPT Provider Note   CSN: [id number] Arrival date & time: 10/17/20  2146     History Chief Complaint  Patient presents with  . Fall  . Head Injury    Patricia Andrews is a 87 y.o. female.  HPI      87 year old female with history of hypertension, hyperlipidemia, diabetes, CVA with left-sided weakness who resides in assisted living, presents with concern for fall.  Reports she did not have syncope or loss of consciousness although does not remember how she fell "it happened so fast." She is otherwise in normal state of health, no chest pain, no dyspnea, no cough, no fever, no nausea, vomiting, diarrhea, black or bloody stools, new numbness or weakness.  Reports hitting the left side of her head and having pain to the left side of her head as well as pain to her left side of her back.  Reports that the pain is severe with movement.  Denies shortness of breath   Past Medical History:  Diagnosis Date  . Allergic rhinitis   . Anemia   . Arthritis   . Depression    anxiety overlap  . Diabetes mellitus, type 2 ([place])   . Distal radius fracture   . Hyperlipidemia   . Hypertension   . Osteoporosis, senile    accidental fall w/ pelvic fx 2006, L wrist fx 10/2013  . Pelvis fracture ([place])   . Right wrist fracture 05/14/2015  . Stroke [facility]) 1979   R MCA>>residual spastic L HP; TIA 06/2011    Patient Active Problem List   Diagnosis Date Noted  . Pressure sore on heel, right, stage I 11/13/2019  . Insomnia 09/10/2018  . Sensorineural hearing loss 02/10/2017  . Dyspnea on exertion 12/31/2016  . Dementia ([place]) 12/30/2016  . Hyperkalemia 07/24/2016  . Poor balance 07/11/2016  . Upper back pain 07/11/2016  . Pain due to onychomycosis of toenail 02/04/2013  . Diabetes type 2, controlled ([place]) 09/20/2011  . Hypertension 09/20/2011  . Hyperlipidemia 09/20/2011  . Senile osteoporosis 09/20/2011  . Depression with anxiety 09/20/2011  .  Anemia 09/20/2011  . CVA, old, hemiparesis ([place])     Past Surgical History:  Procedure Laterality Date  . ABDOMINAL HYSTERECTOMY  1960   Fibroid tumors  . COLONOSCOPY  06/06/2012   Procedure: COLONOSCOPY;  Surgeon: Inda Castle, MD;  Location: [facility];  Service: Endoscopy;  Laterality: N/A;  Rm 1501. Pt to be discharged after procedure. Patient was only admitted for prep.     OB History   No obstetric history on file.     Family History  Problem Relation Age of Onset  . Rheum arthritis Mother     Social History   Tobacco Use  . Smoking status: Never Smoker  . Smokeless tobacco: Never Used  Vaping Use  . Vaping Use: Never used  Substance Use Topics  . Alcohol use: No    Alcohol/week: 0.0 standard drinks  . Drug use: No    Home Medications Prior to Admission medications   Medication Sig Start Date End Date Taking? Authorizing Provider  acetaminophen (TYLENOL) 500 MG tablet Take 1 tablet (500 mg total) by mouth every 6 (six) hours as needed for mild pain. 07/07/19   Binnie Rail, MD  atorvastatin (LIPITOR) 20 MG tablet TAKE 1 TABLET BY MOUTH  DAILY 07/15/19   Binnie Rail, MD  Blood Glucose Monitoring Suppl (ONETOUCH VERIO FLEX SYSTEM) w/Device KIT USE TO CHECK BLOOD SUGAR  TWO TIMES DAILY 02/18/20   Binnie Rail, MD  citalopram (CELEXA) 20 MG tablet TAKE 1 TABLET BY MOUTH  DAILY 02/24/19   Binnie Rail, MD  denosumab (PROLIA) 60 MG/ML SOLN injection Inject 60 mg into the skin every 6 (six) months. Administer in upper arm, thigh, or abdomen  Usually take on the 23rd of each month    [provider]  diltiazem (CARDIZEM CD) 300 MG 24 hr capsule TAKE 1 CAPSULE BY MOUTH  DAILY 01/20/19   Binnie Rail, MD  divalproex (DEPAKOTE) 125 MG DR tablet Take 125 mg by mouth at bedtime. 05/20/20   [provider]  donepezil (ARICEPT) 10 MG tablet TAKE 1 TABLET BY MOUTH AT  BEDTIME 09/29/18   Burns, Claudina Lick, MD  glucose blood (ONETOUCH VERIO) test strip  CHECK BLOOD SUGAR TWO TIMES DAILY AS DIRECTED.  E11.9 02/18/20   Binnie Rail, MD  hydroxypropyl methylcellulose / hypromellose (ISOPTO TEARS / GONIOVISC) 2.5 % ophthalmic solution Place 1 drop into both eyes as needed for dry eyes (dry eyes).    [provider]  insulin lispro (HUMALOG KWIKPEN) 100 UNIT/ML KiwkPen Inject into the skin 3 (three) times daily. (Per sliding Scale) 151-200 = 2 units, 201-250 = 4 units, 251-300 = 6 units 301-350 = 8 units, 351-400 = 10 units    [provider]  Insulin Pen Needle 32G X 4 MM MISC Use to administer insulin four times a day Dx E11.9 02/01/16   Binnie Rail, MD  Lancets (ONETOUCH DELICA PLUS LNLGXQ11H) MISC USE WITH LANCING DEVICE TO  CHECK BLOOD SUGAR TWO TIMES DAILY AS DIRECTED. E11.9 02/18/20   Binnie Rail, MD  LANTUS SOLOSTAR 100 UNIT/ML Solostar Pen INJECT SUBCUTANEOUSLY 10  UNITS DAILY 09/29/18   Binnie Rail, MD  memantine (NAMENDA) 5 MG tablet TAKE 1 TABLET BY MOUTH  TWICE DAILY 06/10/19   Binnie Rail, MD  mirtazapine (REMERON) 30 MG tablet Take 1 tablet (30 mg total) by mouth at bedtime. 03/28/20   Binnie Rail, MD  Misc. Devices (RAISED TOILET SEAT) MISC Use as directed 07/07/19   Binnie Rail, MD  Multiple Vitamin (MULTIVITAMIN) capsule Take 1 capsule by mouth daily. 09/14/19   Binnie Rail, MD  olmesartan (BENICAR) 20 MG tablet TAKE 1 TABLET BY MOUTH  DAILY 02/24/19   Burns, Claudina Lick, MD  Omega-3 Fatty Acids (FISH OIL) 1000 MG CAPS Take 1 capsule by mouth every morning. 11/13/19   Binnie Rail, MD  vitamin B-12 (CYANOCOBALAMIN) 100 MCG tablet Take 100 mcg by mouth daily.    [provider]  zolpidem (AMBIEN) 5 MG tablet Take 1 tablet (5 mg total) by mouth at bedtime. 11/13/19   Binnie Rail, MD    Allergies    Patient has no known allergies.  Review of Systems   Review of Systems  Constitutional: Negative for fever.  HENT: Negative for sore throat.   Eyes: Negative for visual disturbance.   Respiratory: Negative for cough and shortness of breath.   Cardiovascular: Positive for chest pain.  Gastrointestinal: Negative for abdominal pain, diarrhea, nausea and vomiting.  Genitourinary: Negative for difficulty urinating.  Musculoskeletal: Positive for back pain. Negative for neck pain.  Skin: Negative for rash.  Neurological: Negative for syncope and headaches.    Physical Exam Updated Vital Signs BP (!) 170/64   Pulse 66   Temp 98 F (36.7 C) (Oral)   Resp 18   SpO2 99%  Physical Exam Vitals and nursing note reviewed.  Constitutional:      General: She is not in acute distress.    Appearance: She is well-developed and well-nourished. She is not diaphoretic.  HENT:     Head: Normocephalic and atraumatic.  Eyes:     Extraocular Movements: EOM normal.     Conjunctiva/sclera: Conjunctivae normal.  Cardiovascular:     Rate and Rhythm: Normal rate and regular rhythm.     Pulses: Intact distal pulses.     Heart sounds: Normal heart sounds. No murmur heard. No friction rub. No gallop.   Pulmonary:     Effort: Pulmonary effort is normal. No respiratory distress.     Breath sounds: Normal breath sounds. No wheezing or rales.  Chest:     Chest wall: Tenderness (tendernss anterior left chest and posterior left chest) present.  Abdominal:     General: There is no distension.     Palpations: Abdomen is soft.     Tenderness: There is no abdominal tenderness. There is no guarding.  Musculoskeletal:        General: Tenderness (cervical and thoracic spine) present. No edema.     Cervical back: Normal range of motion.  Skin:    General: Skin is warm and dry.     Findings: No erythema or rash.  Neurological:     Mental Status: She is alert and oriented to person, place, and time.     ED Results / Procedures / Treatments   Labs (all labs ordered are listed, but only abnormal results are displayed) Labs Reviewed - No data to display  EKG None  Radiology CT Head Wo  Contrast  Result Date: 10/17/2020 CLINICAL DATA:  Head trauma EXAM: CT HEAD WITHOUT CONTRAST CT CERVICAL SPINE WITHOUT CONTRAST TECHNIQUE: Multidetector CT imaging of the head and cervical spine was performed following the standard protocol without intravenous contrast. Multiplanar CT image reconstructions of the cervical spine were also generated. COMPARISON:  CT brain and cervical spine 07/08/2016 FINDINGS: CT HEAD FINDINGS Brain: No acute territorial infarction, hemorrhage, or intracranial mass. Chronic right MCA infarct as before. Moderate white matter hypodensity consistent with chronic small vessel ischemic change. Moderate atrophy. Stable ventricle size. Vascular: No hyperdense vessels.  Carotid vascular calcification Skull: Normal. Negative for fracture or focal lesion. Sinuses/Orbits: No acute finding. Other: Moderate left frontotemporal scalp hematoma. CT CERVICAL SPINE FINDINGS Alignment: Reversal of cervical lordosis. Trace anterolisthesis C3 on C4. Facet alignment is maintained. Skull base and vertebrae: No acute fracture. No primary bone lesion or focal pathologic process. Soft tissues and spinal canal: Normal prevertebral soft tissue thickness. No visible canal hematoma Disc levels: Multiple level degenerative change, most advanced C4 through C7 where there is disc space narrowing, osteophyte and endplate changes. Facet degenerative changes at multiple levels. Upper chest: Negative. Other: None IMPRESSION: 1. No CT evidence for acute intracranial abnormality. Chronic right MCA infarct. Atrophy and chronic small vessel ischemic changes of the white matter. Moderate left frontotemporal scalp hematoma. 2. Reversal of cervical lordosis with degenerative changes. No acute osseous abnormality. Electronically Signed   By: Donavan Foil M.D.   On: 10/17/2020 23:23   CT Cervical Spine Wo Contrast  Result Date: 10/17/2020 CLINICAL DATA:  Head trauma EXAM: CT HEAD WITHOUT CONTRAST CT CERVICAL SPINE WITHOUT  CONTRAST TECHNIQUE: Multidetector CT imaging of the head and cervical spine was performed following the standard protocol without intravenous contrast. Multiplanar CT image reconstructions of the cervical spine were also generated. COMPARISON:  CT brain and  cervical spine 07/08/2016 FINDINGS: CT HEAD FINDINGS Brain: No acute territorial infarction, hemorrhage, or intracranial mass. Chronic right MCA infarct as before. Moderate white matter hypodensity consistent with chronic small vessel ischemic change. Moderate atrophy. Stable ventricle size. Vascular: No hyperdense vessels. Carotid vascular calcification Skull: Normal. Negative for fracture or focal lesion. Sinuses/Orbits: No acute finding. Other: Moderate left frontotemporal scalp hematoma. CT CERVICAL SPINE FINDINGS Alignment: Reversal of cervical lordosis. Trace anterolisthesis C3 on C4. Facet alignment is maintained. Skull base and vertebrae: No acute fracture. No primary bone lesion or focal pathologic process. Soft tissues and spinal canal: Normal prevertebral soft tissue thickness. No visible canal hematoma Disc levels: Multiple level degenerative change, most advanced C4 through C7 where there is disc space narrowing, osteophyte and endplate changes. Facet degenerative changes at multiple levels. Upper chest: Negative. Other: None IMPRESSION: 1. No CT evidence for acute intracranial abnormality. Chronic right MCA infarct. Atrophy and chronic small vessel ischemic changes of the white matter. Moderate left frontotemporal scalp hematoma. 2. Reversal of cervical lordosis with degenerative changes. No acute osseous abnormality. Electronically Signed   By: Donavan Foil M.D.   On: 10/17/2020 23:24    Procedures Procedures   Medications Ordered in ED Medications  fentaNYL (SUBLIMAZE) injection 50 mcg (50 mcg Intravenous Given 10/17/20 2338)    ED Course  I have reviewed the triage vital signs and the nursing notes.  Pertinent labs & imaging results  that were available during my care of the patient were reviewed by me and considered in my medical decision making (see chart for details).    MDM Rules/Calculators/A&P                           87 year old female with history of hypertension, hyperlipidemia, diabetes, CVA with left-sided weakness who resides in assisted living, presents with concern for fall with head trauma and left sided pain.  Given age, tenderness, CT head and CSpine completed showing no evidence of ICH or fractures.   Chest wall tenderness, midline TSpine and posterior rib tenderness on exam.  Given age and tenderness, CT chest and T Spine ordered and pending.   Denies other acute medical concerns. Denies this being syncopal event, reports she does not remember as it occurred so quickly.    Care signed out with CT chest/tspine and xr pending.  If no injuries, plan to ambulate to help determine most appropriate disposition particularly in the setting of pt residing in assisted living rather than SNF.   Final Clinical Impression(s) / ED Diagnoses Final diagnoses:  Injury of head, initial encounter  Fall, initial encounter    Rx / DC Orders ED Discharge Orders    None       Gareth Morgan, MD 10/18/20 1036

## 2020-10-17 NOTE — ED Triage Notes (Signed)
Pt BIB EMS from [facility] on [address] c/o mechanical fall. Pt hit left side of head of bed - abrasion, bruising, and swelling to the area. Denies back pain, LOC, lightheaded, or dizziness. Pt not on blood thinners. Hx of stroke - paralyzed on left side.

## 2020-10-18 ENCOUNTER — Emergency Department (HOSPITAL_COMMUNITY): Payer: Medicare Other

## 2020-10-18 DIAGNOSIS — I700 Atherosclerosis of aorta: Secondary | ICD-10-CM | POA: Diagnosis not present

## 2020-10-18 DIAGNOSIS — M546 Pain in thoracic spine: Secondary | ICD-10-CM | POA: Diagnosis not present

## 2020-10-18 DIAGNOSIS — S2242XA Multiple fractures of ribs, left side, initial encounter for closed fracture: Secondary | ICD-10-CM | POA: Diagnosis not present

## 2020-10-18 DIAGNOSIS — R590 Localized enlarged lymph nodes: Secondary | ICD-10-CM | POA: Diagnosis not present

## 2020-10-18 DIAGNOSIS — M25512 Pain in left shoulder: Secondary | ICD-10-CM | POA: Diagnosis not present

## 2020-10-18 DIAGNOSIS — S0990XA Unspecified injury of head, initial encounter: Secondary | ICD-10-CM | POA: Diagnosis not present

## 2020-10-18 LAB — HEMOGLOBIN A1C
Hgb A1c MFr Bld: 8.1 % — ABNORMAL HIGH (ref 4.8–5.6)
Mean Plasma Glucose: 185.77 mg/dL

## 2020-10-18 LAB — CBG MONITORING, ED
Glucose-Capillary: 238 mg/dL — ABNORMAL HIGH (ref 70–99)
Glucose-Capillary: 195 mg/dL — ABNORMAL HIGH (ref 70–99)
Glucose-Capillary: 136 mg/dL — ABNORMAL HIGH (ref 70–99)

## 2020-10-18 MED ORDER — HYDROCODONE-ACETAMINOPHEN 5-325 MG PO TABS: 1.0000 | ORAL_TABLET | ORAL | 0 refills | Status: DC | PRN

## 2020-10-18 MED ORDER — HYDROCODONE-ACETAMINOPHEN 5-325 MG PO TABS
1.0000 | ORAL_TABLET | Freq: Once | ORAL | Status: AC
  Administered 2020-10-18: 1 via ORAL
  Filled 2020-10-18: qty 1

## 2020-10-18 MED ORDER — ZOLPIDEM TARTRATE 5 MG PO TABS
5.0000 mg | ORAL_TABLET | Freq: Every day | ORAL | Status: DC
Start: 2020-10-18 — End: 2020-10-19
  Administered 2020-10-18: 5 mg via ORAL
  Filled 2020-10-18: qty 1

## 2020-10-18 MED ORDER — OXYCODONE-ACETAMINOPHEN 5-325 MG PO TABS
1.0000 | ORAL_TABLET | Freq: Once | ORAL | Status: AC
  Administered 2020-10-18: 1 via ORAL
  Filled 2020-10-18: qty 1

## 2020-10-18 MED ORDER — DIVALPROEX SODIUM 125 MG PO DR TAB
125.0000 mg | DELAYED_RELEASE_TABLET | Freq: Every day | ORAL | Status: DC
  Administered 2020-10-18: 125 mg via ORAL
  Filled 2020-10-18 (×2): qty 1

## 2020-10-18 MED ORDER — DILTIAZEM HCL ER COATED BEADS 180 MG PO CP24
300.0000 mg | ORAL_CAPSULE | Freq: Every day | ORAL | Status: DC
  Administered 2020-10-18 – 2020-10-19 (×2): 300 mg via ORAL
  Filled 2020-10-18 (×2): qty 1

## 2020-10-18 MED ORDER — ONDANSETRON 4 MG PO TBDP
4.0000 mg | ORAL_TABLET | Freq: Four times a day (QID) | ORAL | Status: DC | PRN
  Administered 2020-10-18: 4 mg via ORAL
  Filled 2020-10-18: qty 1

## 2020-10-18 MED ORDER — INSULIN ASPART 100 UNIT/ML ~~LOC~~ SOLN
0.0000 [IU] | Freq: Three times a day (TID) | SUBCUTANEOUS | Status: DC
  Administered 2020-10-18: 2 [IU] via SUBCUTANEOUS
  Administered 2020-10-18: 1 [IU] via SUBCUTANEOUS
  Administered 2020-10-18: 3 [IU] via SUBCUTANEOUS
  Administered 2020-10-19 (×2): 2 [IU] via SUBCUTANEOUS
  Filled 2020-10-18: qty 0.09

## 2020-10-18 MED ORDER — MIRTAZAPINE 30 MG PO TABS
30.0000 mg | ORAL_TABLET | Freq: Every day | ORAL | Status: DC
  Administered 2020-10-18: 30 mg via ORAL
  Filled 2020-10-18: qty 1

## 2020-10-18 MED ORDER — CITALOPRAM HYDROBROMIDE 10 MG PO TABS
20.0000 mg | ORAL_TABLET | Freq: Every day | ORAL | Status: DC
  Administered 2020-10-18 – 2020-10-19 (×2): 20 mg via ORAL
  Filled 2020-10-18 (×2): qty 2

## 2020-10-18 MED ORDER — DONEPEZIL HCL 5 MG PO TABS
10.0000 mg | ORAL_TABLET | Freq: Every day | ORAL | Status: DC
  Administered 2020-10-18: 10 mg via ORAL
  Filled 2020-10-18: qty 2

## 2020-10-18 MED ORDER — MEMANTINE HCL 5 MG PO TABS
5.0000 mg | ORAL_TABLET | Freq: Two times a day (BID) | ORAL | Status: DC
  Administered 2020-10-18 – 2020-10-19 (×3): 5 mg via ORAL
  Filled 2020-10-18 (×3): qty 1

## 2020-10-18 NOTE — Progress Notes (Signed)
TOC CM/CSW spoke with Nikki/[place] [phone].  Pt has been accepted to [facility] and can be received 10/19/2020.  CSW will continue to follow for dc needs.  Teana Lindahl Tarpley-Carter, MSW, LCSW-A Pronouns:  She, Her, Hers                  [facility] ED Transitions of CareClinical Social Worker [email or web address] [phone]

## 2020-10-18 NOTE — Evaluation (Signed)
Physical Therapy Evaluation Patient Details Name: Patricia Andrews MRN: [id number] DOB: [birth year] Today's Date: 10/18/2020   History of Present Illness  87 yo female admitted to ED after falling at ALF-found to have a L 5th rib fx. Hx of old CVA with L hemiparesis.  Clinical Impression  On eval in ED, pt required Min-Mod assist for mobility. She walked ~15 feet with use of a RW for support (pt only able to use one side of RW 2* hemiparesis-did not have a quad cane available for use at time of eval). Mod L UE and L side pain from shoulder and rib fx. Discussed d/c plan with pt-she deferred to her daughter for decision making (daughter not present). At baseline, pt was Mod Ind with mobility/ADLs. At this time, recommendation is for ST SNF if pt/family are agreeable. If not, and they choose to return to ALF, pt will need HHPT and, very likely, increased assistance. Will continue to follow and progress activity as tolerated.     Follow Up Recommendations SNF (HHPT if pt/family choose to return to ALF. Will likely need increased assistance at ALF-need to confirm if this is possible)    Equipment Recommendations  None recommended by PT    Recommendations for Other Services       Precautions / Restrictions Precautions Precautions: Fall Precaution Comments: L hemiparesis Restrictions Weight Bearing Restrictions: No      Mobility  Bed Mobility Overal bed mobility: Needs Assistance Bed Mobility: Supine to Sit;Sit to Supine     Supine to sit: Mod assist;HOB elevated Sit to supine: Mod assist;HOB elevated   General bed mobility comments: Assist for trunk and bil LEs. Utilized bed pad to assist pt into sitting. Increased time.    Transfers Overall transfer level: Needs assistance Equipment used: Rolling walker (2 wheeled) Transfers: Sit to/from Stand Sit to Stand: Min assist;From elevated surface         General transfer comment: Assist to rise, control descent. Cues for safety,  technique.  Ambulation/Gait Ambulation/Gait assistance: Min assist Gait Distance (Feet): 15 Feet Assistive device: Rolling walker (2 wheeled) Gait Pattern/deviations: Step-to pattern;Decreased dorsiflexion - left     General Gait Details: Assist to manuever RW and stabilize pt throughout distance. Pt used L hand on RW only-did not have quad cane available for her to use. Tolerated distance well.  Stairs            Wheelchair Mobility    Modified Rankin (Stroke Patients Only)       Balance Overall balance assessment: Needs assistance;History of Falls         Standing balance support: Single extremity supported Standing balance-Leahy Scale: Fair                               Pertinent Vitals/Pain Pain Assessment: Faces Faces Pain Scale: Hurts even more Pain Location: L UE, L side ribs Pain Descriptors / Indicators: Sharp;Discomfort;Grimacing Pain Intervention(s): Limited activity within patient's tolerance;Monitored during session;Repositioned    Home Living Family/patient expects to be discharged to:: Unsure     Type of Home: Assisted living Home Access: Level entry     Home Layout: One level Home Equipment: Cane - quad      Prior Function Level of Independence: Needs assistance   Gait / Transfers Assistance Needed: uses quad cane for ambulation. walks to dining room  ADL's / Homemaking Assistance Needed: pt states she bathes and dresses herself  Hand Dominance        Extremity/Trunk Assessment   Upper Extremity Assessment Upper Extremity Assessment:  (L hemiparesis-no functiona use of L UE)    Lower Extremity Assessment Lower Extremity Assessment:  (L hemiparesis-L foot drop. Able to bear weight without buckling on L)    Cervical / Trunk Assessment Cervical / Trunk Assessment: Normal  Communication   Communication: No difficulties  Cognition Arousal/Alertness: Awake/alert Behavior During Therapy: WFL for tasks  assessed/performed Overall Cognitive Status: Within Functional Limits for tasks assessed                                        General Comments      Exercises     Assessment/Plan    PT Assessment Patient needs continued PT services  PT Problem List Decreased strength;Decreased mobility;Decreased activity tolerance;Decreased balance;Decreased knowledge of use of DME;Pain       PT Treatment Interventions DME instruction;Gait training;Therapeutic activities;Therapeutic exercise;Patient/family education;Balance training;Functional mobility training    PT Goals (Current goals can be found in the Care Plan section)  Acute Rehab PT Goals Patient Stated Goal: to regain PLOF PT Goal Formulation: With patient Time For Goal Achievement: 11/01/20 Potential to Achieve Goals: Good    Frequency Min 3X/week   Barriers to discharge        Co-evaluation               AM-PAC PT "6 Clicks" Mobility  Outcome Measure Help needed turning from your back to your side while in a flat bed without using bedrails?: A Lot Help needed moving from lying on your back to sitting on the side of a flat bed without using bedrails?: A Lot Help needed moving to and from a bed to a chair (including a wheelchair)?: A Little Help needed standing up from a chair using your arms (e.g., wheelchair or bedside chair)?: A Little Help needed to walk in hospital room?: A Little Help needed climbing 3-5 steps with a railing? : Total 6 Click Score: 14    End of Session Equipment Utilized During Treatment: Gait belt Activity Tolerance: Patient tolerated treatment well Patient left: in bed;with call bell/phone within reach   PT Visit Diagnosis: History of falling (Z91.81);Difficulty in walking, not elsewhere classified (R26.2)    Time: 7741-2878 PT Time Calculation (min) (ACUTE ONLY): 20 min   Charges:   PT Evaluation $PT Eval Low Complexity: 1 Low           Faye Ramsay, PT Acute  Rehabilitation  Office: [phone] Pager: [phone]

## 2020-10-18 NOTE — ED Provider Notes (Signed)
1:02 AM Assumed care from Dr. Dalene Seltzer, please see their note for full history, physical and decision making until this point. In brief this is a 87 y.o. year old female who presented to the ED tonight with Fall and Head Injury     Pending CT's, PO challenge, ambulatory challenge and dispo decision based on those.   One rib fracture. Apparently unable to ambulate 2/2 same. Not an indication for admission as she is not hypoxic. Will plan for PT/TOC consult for disposition in AM.   Labs, studies and imaging reviewed by myself and considered in medical decision making if ordered. Imaging interpreted by radiology.  Labs Reviewed - No data to display  CT Head Wo Contrast  Final Result    CT Cervical Spine Wo Contrast  Final Result    CT Thoracic Spine Wo Contrast    (Results Pending)  CT Chest Wo Contrast    (Results Pending)  DG Shoulder Left    (Results Pending)    No follow-ups on file.    Jonaven Hilgers, Barbara Cower, MD 10/18/20 0300

## 2020-10-18 NOTE — ED Notes (Signed)
PT at bedside at this time 

## 2020-10-18 NOTE — ED Notes (Signed)
PTAR called for transport to [facility] on [address]

## 2020-10-18 NOTE — ED Notes (Signed)
Pt reports nauseaParticia Nearing MD aware

## 2020-10-18 NOTE — Progress Notes (Signed)
TOC CM/CSW contacted [facility].  Pts Berk[facility] Harvey has been started.  Paperwork requested by Fransico Him has been faxed.  Reference #:  [id number]  CSW will continue to follow for dc needs.  Oney Tatlock Tarpley-Carter, MSW, LCSW-A Pronouns:  She, Her, Hers                  [facility] ED Transitions of CareClinical Social Worker [email or web address] [phone]

## 2020-10-18 NOTE — Progress Notes (Signed)
Pts daughter/Hazel Laural Benes  [phone] is asking that someone contact her in regards to pts CT results.    CSW will continue to follow for dc needs.  Eloina Ergle Tarpley-Carter, MSW, LCSW-A Pronouns:  She, Her, Hers                  [facility] ED Transitions of CareClinical Social Worker [email or web address] [phone]

## 2020-10-18 NOTE — ED Notes (Addendum)
Pt unable to ambulate due to pain from rib fracture. MD made aware.

## 2020-10-18 NOTE — Progress Notes (Signed)
..   Transition of Care [facility]) - Emergency Department Mini Assessment   Patient Details  Name: Patricia Andrews MRN: [id number] Date of Birth: 09-22-1925  Transition of [facility]) CM/SW Contact:    Zeyna Mkrtchyan C Tarpley-Carter, LCSWA Phone Number: 10/18/2020, 12:56 PM   Clinical Narrative: [facility] CM/CSW spoke with Efraim Kaufmann, RN in regards to pt returning to  [facility] and if they offered PT.  Melissa confirmed that they do offer PT they just need an order.  CSW was given contact Benson Setting, RN [phone]  For call report.  Pt will return to [facility] on [address] following dc.  Taralyn Ferraiolo Tarpley-Carter, MSW, LCSW-A Pronouns:  She, Her, Hers                  [facility] ED Transitions of CareClinical Social Worker [email or web address] [phone]   ED Mini Assessment: What brought you to the Emergency Department? : Fall, head injury  Barriers to Discharge: No Barriers Identified     Means of departure: Ambulance  Interventions which prevented an admission or readmission: SNF Placement    Patient Contact and Communications Key Contact 1: Melissa, RN   Spoke with: Efraim Kaufmann, RN Contact Date: 10/18/20,     Contact Phone Number: [phone] Call outcome: CSW spoke with Efraim Kaufmann, RN about pts return to [facility].  Patient states their goals for this hospitalization and ongoing recovery are:: Pt is ready to return home. CMS Medicare.gov Compare Post Acute Care list provided to:: Patient Choice offered to / list presented to : Patient,Adult Children  Admission diagnosis:  Fall with Head Injury  Patient Active Problem List   Diagnosis Date Noted  . Pressure sore on heel, right, stage I 11/13/2019  . Insomnia 09/10/2018  . Sensorineural hearing loss 02/10/2017  . Dyspnea on exertion 12/31/2016  . Dementia ([facility]) 12/30/2016  . Hyperkalemia 07/24/2016  . Poor balance 07/11/2016  . Upper back pain 07/11/2016  . Pain due to onychomycosis of toenail 02/04/2013  .  Diabetes type 2, controlled ([facility]) 09/20/2011  . Hypertension 09/20/2011  . Hyperlipidemia 09/20/2011  . Senile osteoporosis 09/20/2011  . Depression with anxiety 09/20/2011  . Anemia 09/20/2011  . CVA, old, hemiparesis ([facility])    PCP:  Pincus Sanes, MD Pharmacy:   [facility] - [place], [place] - [phone] [address] [place] [address] Phone: [phone] Fax: [phone]

## 2020-10-18 NOTE — NC FL2 (Signed)
[place] LEVEL OF CARE SCREENING TOOL     IDENTIFICATION  Patient Name: Patricia Andrews Birthdate: [birth year] Sex: female Admission Date (Current Location): 10/17/2020  [place] and [place] Number:  Patricia Andrews [id number] [place] and Address:  [facility]  [place] [address] [place]      Provider Number: [id number]  Attending Physician Name and Address:  Default, Provider, MD  Relative Name and Phone Number:  Erskine Speed [phone]    Current Level of Care: Hospital Recommended Level of Care: [place] Prior Approval Number:    Date Approved/Denied:   PASRR Number: [id number]  Discharge Plan: SNF    Current Diagnoses: Patient Active Problem List   Diagnosis Date Noted  . Pressure sore on heel, right, stage I 11/13/2019  . Insomnia 09/10/2018  . Sensorineural hearing loss 02/10/2017  . Dyspnea on exertion 12/31/2016  . Dementia ([place]) 12/30/2016  . Hyperkalemia 07/24/2016  . Poor balance 07/11/2016  . Upper back pain 07/11/2016  . Pain due to onychomycosis of toenail 02/04/2013  . Diabetes type 2, controlled ([place]) 09/20/2011  . Hypertension 09/20/2011  . Hyperlipidemia 09/20/2011  . Senile osteoporosis 09/20/2011  . Depression with anxiety 09/20/2011  . Anemia 09/20/2011  . CVA, old, hemiparesis ([place])     Orientation RESPIRATION BLADDER Height & Weight        Normal Continent Weight:   Height:     BEHAVIORAL SYMPTOMS/MOOD NEUROLOGICAL BOWEL NUTRITION STATUS      Continent Diet (Carb Modified)  AMBULATORY STATUS COMMUNICATION OF NEEDS Skin   Supervision Verbally Normal                       Personal Care Assistance Level of Assistance  Bathing,Feeding,Dressing Bathing Assistance: Limited assistance Feeding assistance: Independent Dressing Assistance: Limited assistance     Functional Limitations Info  Sight,Hearing,Speech Sight Info: Adequate Hearing Info: Adequate Speech Info: Adequate     SPECIAL CARE FACTORS FREQUENCY  PT (By licensed PT),OT (By licensed OT)     PT Frequency: 5 times a week. OT Frequency: 5 times a week            Contractures Contractures Info: Not present    Additional Factors Info  Code Status,Allergies Code Status Info: DNR Allergies Info: NKA           Current Medications (10/18/2020):  This is the current hospital active medication list Current Facility-Administered Medications  Medication Dose Route Frequency Provider Last Rate Last Admin  . citalopram (CELEXA) tablet 20 mg  20 mg Oral Daily Mesner, Jason, MD   20 mg at 10/18/20 0913  . diltiazem (CARDIZEM CD) 24 hr capsule 300 mg  300 mg Oral Daily Mesner, Jason, MD   300 mg at 10/18/20 0913  . divalproex (DEPAKOTE) DR tablet 125 mg  125 mg Oral QHS Mesner, Corene Cornea, MD      . donepezil (ARICEPT) tablet 10 mg  10 mg Oral QHS Mesner, Jason, MD      . insulin aspart (novoLOG) injection 0-9 Units  0-9 Units Subcutaneous TID WC Mesner, Corene Cornea, MD   2 Units at 10/18/20 1311  . memantine (NAMENDA) tablet 5 mg  5 mg Oral BID Mesner, Corene Cornea, MD   5 mg at 10/18/20 0914  . mirtazapine (REMERON) tablet 30 mg  30 mg Oral QHS Mesner, Jason, MD      . ondansetron (ZOFRAN-ODT) disintegrating tablet 4 mg  4 mg Oral Q6H PRN Isla Pence, MD  4 mg at 10/18/20 0834  . zolpidem (AMBIEN) tablet 5 mg  5 mg Oral QHS Mesner, Corene Cornea, MD       Current Outpatient Medications  Medication Sig Dispense Refill  . acetaminophen (TYLENOL) 500 MG tablet Take 1 tablet (500 mg total) by mouth every 6 (six) hours as needed for mild pain. 90 tablet 5  . atorvastatin (LIPITOR) 20 MG tablet TAKE 1 TABLET BY MOUTH  DAILY (Patient taking differently: Take 20 mg by mouth daily.) 90 tablet 3  . citalopram (CELEXA) 20 MG tablet TAKE 1 TABLET BY MOUTH  DAILY (Patient taking differently: Take 20 mg by mouth daily.) 90 tablet 1  . cromolyn (OPTICROM) 4 % ophthalmic solution Place 1 drop into both eyes 2 (two) times daily.    Marland Kitchen  diltiazem (CARDIZEM CD) 300 MG 24 hr capsule TAKE 1 CAPSULE BY MOUTH  DAILY (Patient taking differently: Take 300 mg by mouth daily.) 90 capsule 1  . divalproex (DEPAKOTE) 250 MG DR tablet Take 250 mg by mouth every evening.    . donepezil (ARICEPT) 10 MG tablet TAKE 1 TABLET BY MOUTH AT  BEDTIME (Patient taking differently: Take 10 mg by mouth at bedtime.) 90 tablet 1  . LANTUS SOLOSTAR 100 UNIT/ML Solostar Pen INJECT SUBCUTANEOUSLY 10  UNITS DAILY (Patient taking differently: Inject 10 Units into the skin at bedtime.) 15 mL 1  . memantine (NAMENDA) 5 MG tablet TAKE 1 TABLET BY MOUTH  TWICE DAILY (Patient taking differently: Take 5 mg by mouth 2 (two) times daily.) 180 tablet 3  . mirtazapine (REMERON) 30 MG tablet Take 1 tablet (30 mg total) by mouth at bedtime. 90 tablet 1  . Multiple Vitamin (MULTIVITAMIN) capsule Take 1 capsule by mouth daily. 90 capsule 3  . olmesartan (BENICAR) 20 MG tablet TAKE 1 TABLET BY MOUTH  DAILY (Patient taking differently: Take 20 mg by mouth daily.) 90 tablet 3  . Omega-3 Fatty Acids (FISH OIL) 1000 MG CAPS Take 1 capsule by mouth every morning. 90 capsule 3  . vitamin B-12 (CYANOCOBALAMIN) 100 MCG tablet Take 100 mcg by mouth daily.    Marland Kitchen zolpidem (AMBIEN) 5 MG tablet Take 1 tablet (5 mg total) by mouth at bedtime. 30 tablet 5  . Blood Glucose Monitoring Suppl (ONETOUCH VERIO FLEX SYSTEM) w/Device KIT USE TO CHECK BLOOD SUGAR  TWO TIMES DAILY 1 kit 0  . denosumab (PROLIA) 60 MG/ML SOLN injection Inject 60 mg into the skin every 6 (six) months. Administer in upper arm, thigh, or abdomen  Usually take on the 23rd of each month    . glucose blood (ONETOUCH VERIO) test strip CHECK BLOOD SUGAR TWO TIMES DAILY AS DIRECTED.  E11.9 200 strip 3  . HYDROcodone-acetaminophen (NORCO/VICODIN) 5-325 MG tablet Take 1 tablet by mouth every 4 (four) hours as needed. 10 tablet 0  . Insulin Pen Needle 32G X 4 MM MISC Use to administer insulin four times a day Dx E11.9 30 each 0  .  Lancets (ONETOUCH DELICA PLUS RNHAFB90X) MISC USE WITH LANCING DEVICE TO  CHECK BLOOD SUGAR TWO TIMES DAILY AS DIRECTED. E11.9 200 each 3  . Misc. Devices (RAISED TOILET SEAT) MISC Use as directed 1 each 0     Discharge Medications: Please see discharge summary for a list of discharge medications.  Relevant Imaging Results:  Relevant Lab Results:   Additional Information SSN#:  [id number]  Ricquita C Tarpley-Carter, LCSWA

## 2020-10-18 NOTE — ED Provider Notes (Addendum)
Pt did come and evaluate this patient.  The facility is able to have pt and ot come for this patient.  I put the order in for this to happen.  Pt and family may want to consider SNF in the future.   Jacalyn Lefevre, MD 10/18/20 1218  I spoke with the daughter and she does not want her to go back to the assisted living facility.  She said the PT that comes in is not good enough.  I tried to encourage her to let the pt go back because that would be better than just waiting here, but she wants a snf.  I let the nurses and SW know.    Jacalyn Lefevre, MD 10/18/20 1339

## 2020-10-18 NOTE — Progress Notes (Signed)
TOC CM/CSW notified pts daughter/Hazel Laural Benes [phone] that pt would receive PT and OT at [facility] on [place].  CSW will continue to follow for dc needs.  Lataya Varnell Tarpley-Carter, MSW, LCSW-A Pronouns:  She, Her, Hers                  [facility] Transitions of CareClinical Social Worker [email or web address] [phone]

## 2020-10-18 NOTE — Progress Notes (Signed)
TOC CM/CSW contacted [facility] [phone] in regards to offering PT services.  Melissa confirmed that they do offer PT they just need an order.  Order was faxed to [facility] by CSW.  CSW will continue to follow for dc needs.  Tunisha Ruland Tarpley-Carter, MSW, LCSW-A Pronouns:  She, Her, Hers                  [facility] Transitions of CareClinical Social Worker [email or web address] [phone]

## 2020-10-18 NOTE — ED Notes (Signed)
Assisted pt with calling daughter. Spoke with daughter. Daughter requested pt to have privacy due to being in the hall. Bifold privacy curtains provided at this time.

## 2020-10-18 NOTE — ED Notes (Signed)
Per SW: Pt is not going back to [facility] due to daughters refusal of transport back to facility

## 2020-10-18 NOTE — ED Notes (Signed)
Report called to Vanice Sarah, Charity fundraiser at [facility].

## 2020-10-18 NOTE — ED Notes (Signed)
Per MD Particia Nearing, pt's daughter does not want pt to return to [facility]. SW aware.

## 2020-10-18 NOTE — ED Notes (Signed)
Per CM, pt will be going to [facility].

## 2020-10-18 NOTE — ED Notes (Signed)
Pt given meal tray.

## 2020-10-19 DIAGNOSIS — I69359 Hemiplegia and hemiparesis following cerebral infarction affecting unspecified side: Secondary | ICD-10-CM | POA: Diagnosis not present

## 2020-10-19 DIAGNOSIS — I69354 Hemiplegia and hemiparesis following cerebral infarction affecting left non-dominant side: Secondary | ICD-10-CM | POA: Diagnosis not present

## 2020-10-19 DIAGNOSIS — E785 Hyperlipidemia, unspecified: Secondary | ICD-10-CM | POA: Diagnosis not present

## 2020-10-19 DIAGNOSIS — M810 Age-related osteoporosis without current pathological fracture: Secondary | ICD-10-CM | POA: Diagnosis not present

## 2020-10-19 DIAGNOSIS — M546 Pain in thoracic spine: Secondary | ICD-10-CM | POA: Diagnosis not present

## 2020-10-19 DIAGNOSIS — L89611 Pressure ulcer of right heel, stage 1: Secondary | ICD-10-CM | POA: Diagnosis not present

## 2020-10-19 DIAGNOSIS — Z7401 Bed confinement status: Secondary | ICD-10-CM | POA: Diagnosis not present

## 2020-10-19 DIAGNOSIS — R6889 Other general symptoms and signs: Secondary | ICD-10-CM | POA: Diagnosis not present

## 2020-10-19 DIAGNOSIS — R2681 Unsteadiness on feet: Secondary | ICD-10-CM | POA: Diagnosis not present

## 2020-10-19 DIAGNOSIS — F0150 Vascular dementia without behavioral disturbance: Secondary | ICD-10-CM | POA: Diagnosis not present

## 2020-10-19 DIAGNOSIS — Z20822 Contact with and (suspected) exposure to covid-19: Secondary | ICD-10-CM | POA: Diagnosis not present

## 2020-10-19 DIAGNOSIS — Z743 Need for continuous supervision: Secondary | ICD-10-CM | POA: Diagnosis not present

## 2020-10-19 DIAGNOSIS — M549 Dorsalgia, unspecified: Secondary | ICD-10-CM | POA: Diagnosis not present

## 2020-10-19 DIAGNOSIS — M542 Cervicalgia: Secondary | ICD-10-CM | POA: Diagnosis not present

## 2020-10-19 DIAGNOSIS — M6281 Muscle weakness (generalized): Secondary | ICD-10-CM | POA: Diagnosis not present

## 2020-10-19 DIAGNOSIS — M2550 Pain in unspecified joint: Secondary | ICD-10-CM | POA: Diagnosis not present

## 2020-10-19 DIAGNOSIS — S2232XD Fracture of one rib, left side, subsequent encounter for fracture with routine healing: Secondary | ICD-10-CM | POA: Diagnosis not present

## 2020-10-19 DIAGNOSIS — J309 Allergic rhinitis, unspecified: Secondary | ICD-10-CM | POA: Diagnosis not present

## 2020-10-19 DIAGNOSIS — M545 Low back pain, unspecified: Secondary | ICD-10-CM | POA: Diagnosis not present

## 2020-10-19 DIAGNOSIS — S0990XD Unspecified injury of head, subsequent encounter: Secondary | ICD-10-CM | POA: Diagnosis not present

## 2020-10-19 DIAGNOSIS — I10 Essential (primary) hypertension: Secondary | ICD-10-CM | POA: Diagnosis not present

## 2020-10-19 DIAGNOSIS — G4700 Insomnia, unspecified: Secondary | ICD-10-CM | POA: Diagnosis not present

## 2020-10-19 DIAGNOSIS — Z79899 Other long term (current) drug therapy: Secondary | ICD-10-CM | POA: Diagnosis not present

## 2020-10-19 DIAGNOSIS — R2689 Other abnormalities of gait and mobility: Secondary | ICD-10-CM | POA: Diagnosis not present

## 2020-10-19 DIAGNOSIS — Z9181 History of falling: Secondary | ICD-10-CM | POA: Diagnosis not present

## 2020-10-19 DIAGNOSIS — W19XXXA Unspecified fall, initial encounter: Secondary | ICD-10-CM | POA: Diagnosis not present

## 2020-10-19 DIAGNOSIS — E7849 Other hyperlipidemia: Secondary | ICD-10-CM | POA: Diagnosis not present

## 2020-10-19 DIAGNOSIS — R0789 Other chest pain: Secondary | ICD-10-CM | POA: Diagnosis not present

## 2020-10-19 DIAGNOSIS — E119 Type 2 diabetes mellitus without complications: Secondary | ICD-10-CM | POA: Diagnosis not present

## 2020-10-19 DIAGNOSIS — H905 Unspecified sensorineural hearing loss: Secondary | ICD-10-CM | POA: Diagnosis not present

## 2020-10-19 DIAGNOSIS — I69828 Other speech and language deficits following other cerebrovascular disease: Secondary | ICD-10-CM | POA: Diagnosis not present

## 2020-10-19 DIAGNOSIS — D649 Anemia, unspecified: Secondary | ICD-10-CM | POA: Diagnosis not present

## 2020-10-19 DIAGNOSIS — S0990XA Unspecified injury of head, initial encounter: Secondary | ICD-10-CM | POA: Diagnosis not present

## 2020-10-19 DIAGNOSIS — Z794 Long term (current) use of insulin: Secondary | ICD-10-CM | POA: Diagnosis not present

## 2020-10-19 LAB — CBG MONITORING, ED
Glucose-Capillary: 168 mg/dL — ABNORMAL HIGH (ref 70–99)
Glucose-Capillary: 167 mg/dL — ABNORMAL HIGH (ref 70–99)
Glucose-Capillary: 160 mg/dL — ABNORMAL HIGH (ref 70–99)

## 2020-10-19 LAB — SARS CORONAVIRUS 2 (TAT 6-24 HRS): SARS Coronavirus 2: NEGATIVE

## 2020-10-19 NOTE — ED Notes (Signed)
Attempted to call [facility] for report with no answer

## 2020-10-19 NOTE — Progress Notes (Signed)
TOC CM/CSW attempted to contact [name] Johnson/pts daughter [phone].  CSW left HIPPA compliant message with my contact information.   CSW will continue to follow for dc needs.  Sheikh Leverich Tarpley-Carter, MSW, LCSW-A Pronouns:  She, Her, Hers                  [facility] Transitions of CareClinical Social Worker [email or web address] [phone]

## 2020-10-19 NOTE — Progress Notes (Signed)
TOC CM/CSW faxed AVS to Nikki/[facility] [phone].  Pts room # and call report are as follows:    Room#:  108  Call Report:  [phone]  Morrie Sheldon, RN has been notified of this information.  CSW will continue to follow for dc needs.  Aalyah Mansouri Tarpley-Carter, MSW, LCSW-A Pronouns:  She, Her, Hers                  [facility] Transitions of CareClinical Social Worker [email or web address] [phone]

## 2020-10-19 NOTE — ED Notes (Signed)
Attempted to call [facility] report with no answer. Social worker notified

## 2020-10-19 NOTE — Progress Notes (Signed)
TOC CM/CSW has been approved for [facility].    Reference #:  [id number]  Start Date:  10/19/2020  Review Date:  10/21/2020  Reviewer:  Rebekah Chesterfield  Fax # for review:  [phone]  CSW will continue to follow for dc needs.  Clydie Dillen Tarpley-Carter, MSW, LCSW-A Pronouns:  She, Her, Hers                  [facility] Transitions of CareClinical Social Worker [email or web address] [phone]

## 2020-10-19 NOTE — TOC Initial Note (Signed)
Transition of Care [facility]) - Initial/Assessment Note    Patient Details  Name: Patricia Andrews MRN: [id number] Date of Birth: [birth year]  [facility] CM/SW Contact:    Elliot Cousin, RN Phone Number:  Clinical Narrative:                 TOC CM spoke to pt at bedside. States she prefers TOC CM speak to her dtr, [name]. Contacted dtr, Hazel. Dtr spoke to pt on the phone. Dtr is requesting pt got to [facility] for rehab. States her mother was there before and did very well. Contacted [facility] and states she will review in the hub once information submitted. Spoke to ED provider, Dr Particia Nearing, PTAR cancelled and dc placed on hold pending dc to SNF rehab. PT recommended SNF.   Expected Discharge Plan: Skilled Nursing Facility Barriers to Discharge: No Barriers Identified   Patient Goals and CMS Choice Patient states their goals for this hospitalization and ongoing recovery are:: daughter is requesting SNF rehab CMS Medicare.gov Compare Post Acute Care list provided to:: Patient Represenative (must comment) [facility] Goodman-daughter) Choice offered to / list presented to : Adult Children  Expected Discharge Plan and Services Expected Discharge Plan: Skilled Nursing Facility In-house Referral: Clinical Social Work Discharge Planning Services: CM Consult                                          Prior Living Arrangements/Services   Lives with:: Facility Resident   Do you feel safe going back to the place where you live?: No   needs rehab      Current home services: DME (wheelchair) Criminal Activity/Legal Involvement Pertinent to Current Situation/Hospitalization: No - Comment as needed  Activities of Daily Living      Permission Sought/Granted Permission sought to share information with : Case Manager,Facility Contact Representative,PCP,Family Supports Permission granted to share information with : Yes, Verbal Permission  Granted  Share Information with NAME: Ileana Ladd  Permission granted to share info w AGENCY: SNF  Permission granted to share info w Relationship: daughter  Permission granted to share info w Contact Information: [phone]  Emotional Assessment Appearance:: Appears stated age Attitude/Demeanor/Rapport: Gracious Affect (typically observed): Accepting Orientation: : Oriented to Self,Oriented to Place,Oriented to  Time,Oriented to Situation   Psych Involvement: No (comment)  Admission diagnosis:  Fall with Head Injury  Patient Active Problem List   Diagnosis Date Noted  . Pressure sore on heel, right, stage I 11/13/2019  . Insomnia 09/10/2018  . Sensorineural hearing loss 02/10/2017  . Dyspnea on exertion 12/31/2016  . Dementia ([facility]) 12/30/2016  . Hyperkalemia 07/24/2016  . Poor balance 07/11/2016  . Upper back pain 07/11/2016  . Pain due to onychomycosis of toenail 02/04/2013  . Diabetes type 2, controlled ([facility]) 09/20/2011  . Hypertension 09/20/2011  . Hyperlipidemia 09/20/2011  . Senile osteoporosis 09/20/2011  . Depression with anxiety 09/20/2011  . Anemia 09/20/2011  . CVA, old, hemiparesis ([facility])    PCP:  Pincus Sanes, MD Pharmacy:   [facility] [place], [place] - [phone] [address] [place] [address] Phone: [phone] Fax: [phone]     Social Determinants of Health (SDOH) Interventions    Readmission Risk Interventions No flowsheet data found.

## 2020-10-19 NOTE — ED Notes (Signed)
Attempted to call report to [facility] with no answer

## 2020-10-19 NOTE — ED Notes (Signed)
Called [facility] for transportation to [facility]

## 2020-10-20 ENCOUNTER — Encounter: Payer: Self-pay | Admitting: Orthopedic Surgery

## 2020-10-20 ENCOUNTER — Non-Acute Institutional Stay (SKILLED_NURSING_FACILITY): Payer: Medicare Other | Admitting: Orthopedic Surgery

## 2020-10-20 ENCOUNTER — Other Ambulatory Visit: Payer: Self-pay | Admitting: Orthopedic Surgery

## 2020-10-20 DIAGNOSIS — S2232XD Fracture of one rib, left side, subsequent encounter for fracture with routine healing: Secondary | ICD-10-CM | POA: Diagnosis not present

## 2020-10-20 DIAGNOSIS — F418 Other specified anxiety disorders: Secondary | ICD-10-CM

## 2020-10-20 DIAGNOSIS — I10 Essential (primary) hypertension: Secondary | ICD-10-CM

## 2020-10-20 DIAGNOSIS — E7849 Other hyperlipidemia: Secondary | ICD-10-CM

## 2020-10-20 DIAGNOSIS — F0150 Vascular dementia without behavioral disturbance: Secondary | ICD-10-CM | POA: Diagnosis not present

## 2020-10-20 DIAGNOSIS — E119 Type 2 diabetes mellitus without complications: Secondary | ICD-10-CM

## 2020-10-20 DIAGNOSIS — R2689 Other abnormalities of gait and mobility: Secondary | ICD-10-CM | POA: Diagnosis not present

## 2020-10-20 MED ORDER — HYDROCODONE-ACETAMINOPHEN 5-325 MG PO TABS: 1.0000 | ORAL_TABLET | Freq: Four times a day (QID) | ORAL | 0 refills | Status: DC | PRN

## 2020-10-20 NOTE — Progress Notes (Signed)
Location:   [facility] Room Number: 147W Place of Service:  SNF [phone]) Provider:  Windell Moulding, NP  Binnie Rail, MD  Patient Care Team: Binnie Rail, MD as PCP - General (Internal Medicine) Clearnce Sorrel, MD (Neurology) Camelia Phenes, DPM (Inactive) (Podiatry) Inda Castle, MD (Inactive) (Gastroenterology) Binnie Rail, MD as Consulting Physician (Internal Medicine) Charlton Haws, [facility] as Pharmacist (Pharmacist)  Extended Emergency Contact Information Primary Emergency Contact: Johnson,Hazel Address: [address]          [place], [place] [address] Johnnette Litter of [place] Phone: [phone] Mobile Phone: [phone] Relation: Daughter Secondary Emergency Contact: Hanley Hays Address: [address]          [place], [place] [address] Johnnette Litter of [place] Phone: [phone] Relation: Grandson  Code Status:  DNR Goals of care: Advanced Directive information Advanced Directives 02/18/2018  Does Patient Have a Medical Advance Directive? No;Yes  Type of Paramedic of [place];Living will  Does patient want to make changes to medical advance directive? -  Copy of [place] in Chart? -     Chief Complaint  Patient presents with  . Acute Visit    Hospital f/u    HPI:  Pt is a 87 y.o. female seen today for hospital f/u s/p admission from [facility].   She currently resides at [facility]. PMH includes: hypertension, hyperlipidemia, diabetes, CVA, and left sided weakness.   Prior to ED visit, she lived at [facility]. She reports hitting the left side of her head and side. She denies syncope and loss of consciousness. Reports severe pain with movement after incident. In the ED, vitals stable. CT head negative for acute intracranial abnormality. Atrophy and chronic small vessel ischemia changes of white matter noted. CT  spin no acute changes or fractures. CT chest revealed anterolateral left fifth rib fracture. Shoulder x-ray negative for fracture. Covid negative. A1C elevated at 8.1. Blood sugars averaged 130-200's in ED. Family was upset with the PT she was receiving at assisted facility. PT evaluation in ED recommended skilled nursing facility.   Today, she is alert and oriented x 2. Disoriented with time and place. Follows commands and can express needs. Very pleasant. Reports she has left sided weakness from previous stroke. Left sided pain improving each day, pain increased when taking a deep breath. Tylenol effective with pain. Complains of overall stiffness, worse in the morning. Likes to move as much as she can to loosen up each day. Appetite fair. Last BM today. Moderate assist with ADLs. Ambulating about 20 feet. Denies chest pain, shortness of breath, dizziness and lightheaded ness.   No recent falls, behavioral outbursts, or hypoglycemic events.   Facility nurse does not report any concerns, vitals stable.   Past Medical History:  Diagnosis Date  . Allergic rhinitis   . Anemia   . Arthritis   . Depression    anxiety overlap  . Diabetes mellitus, type 2 ([place])   . Distal radius fracture   . Hyperlipidemia   . Hypertension   . Osteoporosis, senile    accidental fall w/ pelvic fx 2006, L wrist fx 10/2013  . Pelvis fracture ([place])   . Right wrist fracture 05/14/2015  . Stroke [facility]) 1979   R MCA>>residual spastic L HP; TIA 06/2011   Past Surgical History:  Procedure Laterality Date  . ABDOMINAL HYSTERECTOMY  1960   Fibroid  tumors  . COLONOSCOPY  06/06/2012   Procedure: COLONOSCOPY;  Surgeon: Inda Castle, MD;  Location: [facility] ENDOSCOPY;  Service: Endoscopy;  Laterality: N/A;  Rm 1501. Pt to be discharged after procedure. Patient was only admitted for prep.    No Known Allergies  Allergies as of 10/20/2020   No Known Allergies     Medication List       Accurate as of October 20, 2020  11:23 AM. If you have any questions, ask your nurse or doctor.        acetaminophen 500 MG tablet Commonly known as: TYLENOL Take 1 tablet (500 mg total) by mouth every 6 (six) hours as needed for mild pain.   atorvastatin 20 MG tablet Commonly known as: LIPITOR TAKE 1 TABLET BY MOUTH  DAILY   bisacodyl 10 MG suppository Commonly known as: DULCOLAX Place 10 mg rectally as needed for moderate constipation.   citalopram 20 MG tablet Commonly known as: CELEXA TAKE 1 TABLET BY MOUTH  DAILY   cromolyn 4 % ophthalmic solution Commonly known as: OPTICROM Place 1 drop into both eyes 2 (two) times daily.   denosumab 60 MG/ML Soln injection Commonly known as: PROLIA Inject 60 mg into the skin every 6 (six) months. Administer in upper arm, thigh, or abdomen  Usually take on the 23rd of each month   diltiazem 300 MG 24 hr capsule Commonly known as: CARDIZEM CD TAKE 1 CAPSULE BY MOUTH  DAILY   divalproex 250 MG DR tablet Commonly known as: DEPAKOTE Take 250 mg by mouth every evening.   docusate sodium 100 MG capsule Commonly known as: COLACE Take 100 mg by mouth 2 (two) times daily.   donepezil 10 MG tablet Commonly known as: ARICEPT TAKE 1 TABLET BY MOUTH AT  BEDTIME   Fish Oil 1000 MG Caps Take 1 capsule by mouth every morning.   HYDROcodone-acetaminophen 5-325 MG tablet Commonly known as: NORCO/VICODIN Take 1 tablet by mouth every 4 (four) hours as needed. What changed: when to take this   Insulin Pen Needle 32G X 4 MM Misc Use to administer insulin four times a day Dx E11.9   Lantus SoloStar 100 UNIT/ML Solostar Pen Generic drug: insulin glargine INJECT SUBCUTANEOUSLY 10  UNITS DAILY   magnesium hydroxide 400 MG/5ML suspension Commonly known as: MILK OF MAGNESIA Take by mouth daily as needed for mild constipation.   memantine 5 MG tablet Commonly known as: NAMENDA TAKE 1 TABLET BY MOUTH  TWICE DAILY   mirtazapine 30 MG tablet Commonly known as:  Remeron Take 1 tablet (30 mg total) by mouth at bedtime.   multivitamin capsule Take 1 capsule by mouth daily.   olmesartan 20 MG tablet Commonly known as: BENICAR TAKE 1 TABLET BY MOUTH  DAILY   OneTouch Delica Plus [id number] Misc USE WITH LANCING DEVICE TO  CHECK BLOOD SUGAR TWO TIMES DAILY AS DIRECTED. E11.9   OneTouch Verio Flex System w/Device Kit USE TO CHECK BLOOD SUGAR  TWO TIMES DAILY   OneTouch Verio test strip Generic drug: glucose blood CHECK BLOOD SUGAR TWO TIMES DAILY AS DIRECTED.  E11.9   polyethylene glycol 17 g packet Commonly known as: MIRALAX / GLYCOLAX Take 17 g by mouth daily.   Raised Toilet Seat Misc Use as directed   vitamin B-12 100 MCG tablet Commonly known as: CYANOCOBALAMIN Take 100 mcg by mouth daily.   zolpidem 5 MG tablet Commonly known as: AMBIEN Take 1 tablet (5 mg total) by mouth at bedtime.  Review of Systems  Constitutional: Negative for activity change, appetite change and fever.  HENT: Negative for congestion, dental problem, hearing loss and trouble swallowing.        Dentures  Eyes: Negative for visual disturbance.       Glasses  Respiratory: Negative for cough, shortness of breath and wheezing.   Cardiovascular: Negative for chest pain and leg swelling.  Gastrointestinal: Negative for abdominal distention, abdominal pain, constipation, diarrhea and nausea.  Genitourinary: Negative for dysuria, frequency and hematuria.  Musculoskeletal: Positive for arthralgias and myalgias.       Left sided weakness, brace to left leg, left sided pain  Neurological: Positive for weakness. Negative for dizziness, light-headedness and headaches.  Psychiatric/Behavioral: Negative for confusion and dysphoric mood. The patient is not nervous/anxious.     Immunization History  Administered Date(s) Administered  . Fluad Quad(high Dose 65+) 06/09/2019  . Influenza, High Dose Seasonal PF 07/11/2016, 06/07/2017  . Influenza, Seasonal,  Injecte, Preservative Fre 08/12/2012  . Influenza,inj,Quad PF,6+ Mos 09/08/2013, 05/16/2015  . Influenza-Unspecified 05/27/2014  . Pneumococcal Conjugate-13 04/13/2015  . Pneumococcal Polysaccharide-23 09/20/2011  . Td 04/21/2013  . Zoster 04/14/2015   Pertinent  Health Maintenance Due  Topic Date Due  . OPHTHALMOLOGY EXAM  02/25/2016  . FOOT EXAM  02/18/2019  . INFLUENZA VACCINE  03/27/2020  . DEXA SCAN  Completed  . PNA vac Low Risk Adult  Completed   Fall Risk  02/18/2018 02/10/2018 04/07/2016 04/13/2015 09/08/2013  Falls in the past year? No No Yes Yes No  Number falls in past yr: - - 1 2 or more -  Injury with Fall? - - Yes Yes -  Comment - - - hit head after one of the falls.  -  Risk Factor Category  - - High Fall Risk - -  Risk for fall due to : Impaired mobility;Impaired balance/gait;History of fall(s) - History of fall(s);Impaired balance/gait;Impaired mobility - -   Functional Status Survey:    Vitals:   10/20/20 1111  BP: (!) 146/74  Pulse: 74  Resp: 18  Temp: (!) 97.5 F (36.4 C)  Weight: 124 lb (56.2 kg)  Height: 5' 2"  (1.575 m)   Body mass index is 22.68 kg/m. Physical Exam Vitals reviewed.  Constitutional:      General: She is not in acute distress. HENT:     Head: Normocephalic.      Right Ear: There is no impacted cerumen.     Left Ear: There is no impacted cerumen.     Nose: Nose normal.     Mouth/Throat:     Mouth: Mucous membranes are moist.  Eyes:     General:        Right eye: No discharge.        Left eye: No discharge.  Neck:   Cardiovascular:     Rate and Rhythm: Normal rate and regular rhythm.     Pulses: Normal pulses.     Heart sounds: Normal heart sounds.  Pulmonary:     Effort: Pulmonary effort is normal. No respiratory distress.     Breath sounds: Normal breath sounds. No wheezing.  Abdominal:     General: Bowel sounds are normal. There is no distension.     Palpations: Abdomen is soft.     Tenderness: There is no abdominal  tenderness.  Musculoskeletal:     Cervical back: Normal range of motion.     Right lower leg: No edema.     Left lower leg: No edema.  Comments: Left leg brace. Right hand grip 5/5. Right dorsiflexion 5/5.   Lymphadenopathy:     Cervical: No cervical adenopathy.  Skin:    General: Skin is warm and dry.     Capillary Refill: Capillary refill takes less than 2 seconds.  Neurological:     General: No focal deficit present.     Mental Status: She is alert. Mental status is at baseline.     Motor: Weakness present.     Gait: Gait abnormal.     Comments: cane  Psychiatric:        Mood and Affect: Mood normal.        Behavior: Behavior normal.     Labs reviewed: No results for input(s): NA, K, CL, CO2, GLUCOSE, BUN, CREATININE, CALCIUM, MG, PHOS in the last 8760 hours. No results for input(s): AST, ALT, ALKPHOS, BILITOT, PROT, ALBUMIN in the last 8760 hours. No results for input(s): WBC, NEUTROABS, HGB, HCT, MCV, PLT in the last 8760 hours. Lab Results  Component Value Date   TSH 2.42 06/09/2019   Lab Results  Component Value Date   HGBA1C 8.1 (H) 10/18/2020   Lab Results  Component Value Date   CHOL 160 06/09/2019   HDL 82.10 06/09/2019   LDLCALC 64 06/09/2019   LDLDIRECT 108.6 04/21/2013   TRIG 66.0 06/09/2019   CHOLHDL 2 06/09/2019    Significant Diagnostic Results in last 30 days:  CT Head Wo Contrast  Result Date: 10/17/2020 CLINICAL DATA:  Head trauma EXAM: CT HEAD WITHOUT CONTRAST CT CERVICAL SPINE WITHOUT CONTRAST TECHNIQUE: Multidetector CT imaging of the head and cervical spine was performed following the standard protocol without intravenous contrast. Multiplanar CT image reconstructions of the cervical spine were also generated. COMPARISON:  CT brain and cervical spine 07/08/2016 FINDINGS: CT HEAD FINDINGS Brain: No acute territorial infarction, hemorrhage, or intracranial mass. Chronic right MCA infarct as before. Moderate white matter hypodensity consistent  with chronic small vessel ischemic change. Moderate atrophy. Stable ventricle size. Vascular: No hyperdense vessels.  Carotid vascular calcification Skull: Normal. Negative for fracture or focal lesion. Sinuses/Orbits: No acute finding. Other: Moderate left frontotemporal scalp hematoma. CT CERVICAL SPINE FINDINGS Alignment: Reversal of cervical lordosis. Trace anterolisthesis C3 on C4. Facet alignment is maintained. Skull base and vertebrae: No acute fracture. No primary bone lesion or focal pathologic process. Soft tissues and spinal canal: Normal prevertebral soft tissue thickness. No visible canal hematoma Disc levels: Multiple level degenerative change, most advanced C4 through C7 where there is disc space narrowing, osteophyte and endplate changes. Facet degenerative changes at multiple levels. Upper chest: Negative. Other: None IMPRESSION: 1. No CT evidence for acute intracranial abnormality. Chronic right MCA infarct. Atrophy and chronic small vessel ischemic changes of the white matter. Moderate left frontotemporal scalp hematoma. 2. Reversal of cervical lordosis with degenerative changes. No acute osseous abnormality. Electronically Signed   By: Donavan Foil M.D.   On: 10/17/2020 23:23   CT Chest Wo Contrast  Result Date: 10/18/2020 CLINICAL DATA:  Chest trauma.  Possible rib fracture. EXAM: CT CHEST WITHOUT CONTRAST TECHNIQUE: Multidetector CT imaging of the chest was performed following the standard protocol without IV contrast. COMPARISON:  None. FINDINGS: Cardiovascular: Calcific aortic atherosclerosis. Normal heart size. No pericardial effusion. Mediastinum/Nodes: No enlarged mediastinal or axillary lymph nodes. Thyroid gland, trachea, and esophagus demonstrate no significant findings. Lungs/Pleura: Lungs are clear. No pleural effusion or pneumothorax. Upper Abdomen: No acute abnormality. Musculoskeletal: Anterolateral left fifth rib fracture. IMPRESSION: 1. Anterolateral left fifth rib fracture.  Aortic Atherosclerosis (ICD10-I70.0). Electronically Signed   By: Ulyses Jarred M.D.   On: 10/18/2020 01:06   CT Cervical Spine Wo Contrast  Result Date: 10/17/2020 CLINICAL DATA:  Head trauma EXAM: CT HEAD WITHOUT CONTRAST CT CERVICAL SPINE WITHOUT CONTRAST TECHNIQUE: Multidetector CT imaging of the head and cervical spine was performed following the standard protocol without intravenous contrast. Multiplanar CT image reconstructions of the cervical spine were also generated. COMPARISON:  CT brain and cervical spine 07/08/2016 FINDINGS: CT HEAD FINDINGS Brain: No acute territorial infarction, hemorrhage, or intracranial mass. Chronic right MCA infarct as before. Moderate white matter hypodensity consistent with chronic small vessel ischemic change. Moderate atrophy. Stable ventricle size. Vascular: No hyperdense vessels. Carotid vascular calcification Skull: Normal. Negative for fracture or focal lesion. Sinuses/Orbits: No acute finding. Other: Moderate left frontotemporal scalp hematoma. CT CERVICAL SPINE FINDINGS Alignment: Reversal of cervical lordosis. Trace anterolisthesis C3 on C4. Facet alignment is maintained. Skull base and vertebrae: No acute fracture. No primary bone lesion or focal pathologic process. Soft tissues and spinal canal: Normal prevertebral soft tissue thickness. No visible canal hematoma Disc levels: Multiple level degenerative change, most advanced C4 through C7 where there is disc space narrowing, osteophyte and endplate changes. Facet degenerative changes at multiple levels. Upper chest: Negative. Other: None IMPRESSION: 1. No CT evidence for acute intracranial abnormality. Chronic right MCA infarct. Atrophy and chronic small vessel ischemic changes of the white matter. Moderate left frontotemporal scalp hematoma. 2. Reversal of cervical lordosis with degenerative changes. No acute osseous abnormality. Electronically Signed   By: Donavan Foil M.D.   On: 10/17/2020 23:24   CT  Thoracic Spine Wo Contrast  Result Date: 10/18/2020 CLINICAL DATA:  Mid back pain EXAM: CT THORACIC SPINE WITHOUT CONTRAST TECHNIQUE: Multidetector CT images of the thoracic were obtained using the standard protocol without intravenous contrast. COMPARISON:  None. FINDINGS: Alignment: Normal. Vertebrae: Large Schmorl's nodes at T12 and L1.  No acute fracture. Paraspinal and other soft tissues: Calcific aortic atherosclerosis. Disc levels: No spinal canal stenosis. IMPRESSION: 1. No acute fracture or static subluxation of the thoracic spine. 2. Large Schmorl's nodes at T12 and L1. Aortic Atherosclerosis (ICD10-I70.0). Electronically Signed   By: Ulyses Jarred M.D.   On: 10/18/2020 01:11   DG Shoulder Left  Result Date: 10/18/2020 CLINICAL DATA:  Fall with left shoulder pain EXAM: LEFT SHOULDER - 2+ VIEW COMPARISON:  None. FINDINGS: Patient is paralyzed on the left side, limiting positioning for this study. No visible fracture. Glenohumeral joint appears located. IMPRESSION: Limited examination due to positioning. No visible fracture or dislocation of the left shoulder. Is Electronically Signed   By: Ulyses Jarred M.D.   On: 10/18/2020 01:12    Assessment/Plan 1. Closed fracture of one rib of left side with routine healing, subsequent encounter - stable, pain tolerable with tylenol - cont norco x 3 days for sever pain - cont PT/OT  2. Vascular dementia without behavioral disturbance (HCC) - no recent behavioral outbursts - cont aricept, namenda, depakote  3. Poor balance - due to left sided weakness - cont PT/OT - cont falls safety precautions  4. Controlled type 2 diabetes mellitus without complication, without long-term current use of insulin (HCC) - A1C > 8 in ED - cont Lantus daily  5. Primary hypertension - bp at goal  - cont benicar - cont low sodium diet - cbc/diff in 1 week - bmp in 1 week  6. Other hyperlipidemia - stable with lipitor  7. Depression with anxiety - stable  with celexa and remeron    Family/ staff Communication:   Labs/tests ordered:

## 2020-10-21 ENCOUNTER — Encounter: Payer: Self-pay | Admitting: Internal Medicine

## 2020-10-21 ENCOUNTER — Non-Acute Institutional Stay (SKILLED_NURSING_FACILITY): Payer: Medicare Other | Admitting: Internal Medicine

## 2020-10-21 DIAGNOSIS — S2232XD Fracture of one rib, left side, subsequent encounter for fracture with routine healing: Secondary | ICD-10-CM

## 2020-10-21 DIAGNOSIS — F0150 Vascular dementia without behavioral disturbance: Secondary | ICD-10-CM | POA: Diagnosis not present

## 2020-10-21 DIAGNOSIS — E119 Type 2 diabetes mellitus without complications: Secondary | ICD-10-CM

## 2020-10-21 DIAGNOSIS — I10 Essential (primary) hypertension: Secondary | ICD-10-CM

## 2020-10-21 DIAGNOSIS — I69359 Hemiplegia and hemiparesis following cerebral infarction affecting unspecified side: Secondary | ICD-10-CM

## 2020-10-21 DIAGNOSIS — E7849 Other hyperlipidemia: Secondary | ICD-10-CM

## 2020-10-21 DIAGNOSIS — L89611 Pressure ulcer of right heel, stage 1: Secondary | ICD-10-CM

## 2020-10-21 DIAGNOSIS — R2689 Other abnormalities of gait and mobility: Secondary | ICD-10-CM | POA: Diagnosis not present

## 2020-10-21 NOTE — Progress Notes (Signed)
Provider:  Rexene Edison. Mariea Clonts, D.O., C.M.D. Location:  [facility] Room Number: 604V Place of Service:  SNF (31)  PCP: Binnie Rail, MD Patient Care Team: Binnie Rail, MD as PCP - General (Internal Medicine) Clearnce Sorrel, MD (Neurology) Camelia Phenes, DPM (Inactive) (Podiatry) Inda Castle, MD (Inactive) (Gastroenterology) Binnie Rail, MD as Consulting Physician (Internal Medicine) Charlton Haws, [facility] as Pharmacist (Pharmacist)  Extended Emergency Contact Information Primary Emergency Contact: Johnson,Hazel Address: [address]          [place], [place] [address] Johnnette Litter of [place] Phone: [phone] Mobile Phone: [phone] Relation: Daughter Secondary Emergency Contact: Hanley Hays Address: [address]          [place], [place] [address] Johnnette Litter of [place] Phone: [phone] Relation: Grandson  Code Status: DNR Goals of Care: Advanced Directive information Advanced Directives 02/18/2018  Does Patient Have a Medical Advance Directive? No;Yes  Type of Paramedic of [place];Living will  Does patient want to make changes to medical advance directive? -  Copy of [place] in Chart? -      Chief Complaint  Patient presents with  . New Admit To SNF    New Admission to [facility]    HPI: Patient is a 87 y.o. female seen today for admission to his farm living and rehab status post hospitalization from February 21-23 due to a fall with head injury and left rib fractures.  She has a past medical history significant for prior stroke many years ago with hemiparesis.  She was an LPN many years ago.  She had been staying in assisted living.  She sustained a fall on February 21.  She had no syncope or loss of consciousness but fell very quickly.  She also had no other associated symptoms.  She hit the left side of her head and was having pain there as well  as on the left side of her back.  Pain was severe with movement.  She denied shortness of breath.  Ms. Ikard does have a past medical history significant for type 2 diabetes, dementia, prior stroke with left-sided weakness, hypertension, hyperlipidemia. She had several imaging studies in the emergency department with CT of the head and neck showing a chronic right MCA infarct, atrophy and chronic small vessel ischemic changes of the white matter with a moderate left frontal temporal scalp hematoma.  She had no acute abnormalities of her cervical spine.  CT of her thoracic spine showed no acute fracture or static subluxation and large Schmorl's nodes at T12 and L1.  She had aortic atherosclerosis.  CT of the chest showed an anterolateral left rib fifth rib fracture.  She was having left shoulder pain but x-ray showed no visible fracture or dislocation but was limited due to her positioning related to her paralysis.    When seen today, she continued to report some discomfort in her left shoulder and certainly in her left posterior ribs.  Her family had decided that she may need long-term skilled nursing care rather than returning to assisted living according to the emergency department notes.  Past Medical History:  Diagnosis Date  . Allergic rhinitis   . Anemia   . Arthritis   . Depression    anxiety overlap  . Diabetes mellitus, type 2 ([place])   . Distal radius fracture   . Hyperlipidemia   . Hypertension   . Osteoporosis, senile  accidental fall w/ pelvic fx 2006, L wrist fx 10/2013  . Pelvis fracture ([place])   . Right wrist fracture 05/14/2015  . Stroke [facility]) 1979   R MCA>>residual spastic L HP; TIA 06/2011   Past Surgical History:  Procedure Laterality Date  . ABDOMINAL HYSTERECTOMY  1960   Fibroid tumors  . COLONOSCOPY  06/06/2012   Procedure: COLONOSCOPY;  Surgeon: Inda Castle, MD;  Location: [facility] ENDOSCOPY;  Service: Endoscopy;  Laterality: N/A;  Rm 1501. Pt to be discharged after  procedure. Patient was only admitted for prep.    Social History   Socioeconomic History  . Marital status: Single    Spouse name: Not on file  . Number of children: 2  . Years of education: Not on file  . Highest education level: Not on file  Occupational History  . Occupation: RETIRED    Employer: RETIRED  Tobacco Use  . Smoking status: Never Smoker  . Smokeless tobacco: Never Used  Vaping Use  . Vaping Use: Never used  Substance and Sexual Activity  . Alcohol use: No    Alcohol/week: 0.0 standard drinks  . Drug use: No  . Sexual activity: Never  Other Topics Concern  . Not on file  Social History Narrative   Lives alone in independent living, single. Never married   Supportive daughter [name] -   Social Determinants of Health   Financial Resource Strain: Low Risk   . Difficulty of Paying Living Expenses: Not hard at all  Food Insecurity: Not on file  Transportation Needs: Not on file  Physical Activity: Not on file  Stress: Not on file  Social Connections: Not on file    reports that she has never smoked. She has never used smokeless tobacco. She reports that she does not drink alcohol and does not use drugs.  Functional Status Survey:    Family History  Problem Relation Age of Onset  . Rheum arthritis Mother     Health Maintenance  Topic Date Due  . COVID-19 Vaccine (1) Never done  . OPHTHALMOLOGY EXAM  02/25/2016  . FOOT EXAM  02/18/2019  . INFLUENZA VACCINE  03/27/2020  . TETANUS/TDAP  04/22/2023  . DEXA SCAN  Completed  . PNA vac Low Risk Adult  Completed    No Known Allergies  Outpatient Encounter Medications as of 10/21/2020  Medication Sig  . acetaminophen (TYLENOL) 500 MG tablet Take 1 tablet (500 mg total) by mouth every 6 (six) hours as needed for mild pain.  Marland Kitchen atorvastatin (LIPITOR) 20 MG tablet TAKE 1 TABLET BY MOUTH  DAILY  . bisacodyl (DULCOLAX) 10 MG suppository Place 10 mg rectally as needed for moderate constipation.  . Blood  Glucose Monitoring Suppl ([place]) w/Device KIT USE TO CHECK BLOOD SUGAR  TWO TIMES DAILY  . citalopram (CELEXA) 20 MG tablet TAKE 1 TABLET BY MOUTH  DAILY  . cromolyn (OPTICROM) 4 % ophthalmic solution Place 1 drop into both eyes 2 (two) times daily.  Marland Kitchen denosumab (PROLIA) 60 MG/ML SOLN injection Inject 60 mg into the skin every 6 (six) months. Administer in upper arm, thigh, or abdomen  Usually take on the 23rd of each month  . diltiazem (CARDIZEM CD) 300 MG 24 hr capsule TAKE 1 CAPSULE BY MOUTH  DAILY  . divalproex (DEPAKOTE) 250 MG DR tablet Take 250 mg by mouth every evening.  . docusate sodium (COLACE) 100 MG capsule Take 100 mg by mouth 2 (two) times daily.  Marland Kitchen donepezil (ARICEPT)  10 MG tablet TAKE 1 TABLET BY MOUTH AT  BEDTIME  . glucose blood (ONETOUCH VERIO) test strip CHECK BLOOD SUGAR TWO TIMES DAILY AS DIRECTED.  E11.9  . HYDROcodone-acetaminophen (NORCO/VICODIN) 5-325 MG tablet Take 1 tablet by mouth every 6 (six) hours as needed.  . Insulin Pen Needle 32G X 4 MM MISC Use to administer insulin four times a day Dx E11.9  . Lancets (ONETOUCH DELICA PLUS [id number]) MISC USE WITH LANCING DEVICE TO  CHECK BLOOD SUGAR TWO TIMES DAILY AS DIRECTED. E11.9  . LANTUS SOLOSTAR 100 UNIT/ML Solostar Pen INJECT SUBCUTANEOUSLY 10  UNITS DAILY  . magnesium hydroxide (MILK OF MAGNESIA) 400 MG/5ML suspension Take by mouth daily as needed for mild constipation.  . memantine (NAMENDA) 5 MG tablet TAKE 1 TABLET BY MOUTH  TWICE DAILY (Patient taking differently: Take 5 mg by mouth 2 (two) times daily.)  . mirtazapine (REMERON) 30 MG tablet Take 1 tablet (30 mg total) by mouth at bedtime.  . Misc. Devices (RAISED TOILET SEAT) MISC Use as directed  . Multiple Vitamin (MULTIVITAMIN) capsule Take 1 capsule by mouth daily.  Marland Kitchen olmesartan (BENICAR) 20 MG tablet TAKE 1 TABLET BY MOUTH  DAILY  . Omega-3 Fatty Acids (FISH OIL) 1000 MG CAPS Take 1 capsule by mouth every morning.  . polyethylene  glycol (MIRALAX / GLYCOLAX) 17 g packet Take 17 g by mouth daily.  . vitamin B-12 (CYANOCOBALAMIN) 100 MCG tablet Take 100 mcg by mouth daily.  Marland Kitchen zolpidem (AMBIEN) 5 MG tablet Take 1 tablet (5 mg total) by mouth at bedtime.   No facility-administered encounter medications on file as of 10/21/2020.    Review of Systems  Constitutional: Negative for chills and fever.  HENT: Positive for hearing loss. Negative for congestion and sore throat.   Eyes: Negative for blurred vision.  Respiratory: Negative for cough and shortness of breath.   Cardiovascular: Negative for chest pain, palpitations and leg swelling.  Gastrointestinal: Negative for abdominal pain and constipation.  Genitourinary: Negative for dysuria.  Musculoskeletal: Positive for back pain, falls and joint pain.  Skin: Negative for itching and rash.  Neurological: Positive for sensory change, focal weakness and weakness. Negative for dizziness and loss of consciousness.       Chronic left hemiparesis  Endo/Heme/Allergies: Bruises/bleeds easily.  Psychiatric/Behavioral: Positive for memory loss. Negative for depression. The patient is not nervous/anxious and does not have insomnia.        Seemed quite clear-minded today    Vitals:   10/21/20 1554  BP: 120/73  Pulse: 77  Resp: 18  Temp: (!) 97 F (36.1 C)  Weight: 118 lb 3.2 oz (53.6 kg)  Height: _0  (1.575 m)   Body mass index is 21.62 kg/m. Physical Exam Vitals reviewed.  Constitutional:      General: She is not in acute distress.    Appearance: Normal appearance. She is not toxic-appearing.  HENT:     Head: Normocephalic and atraumatic.     Right Ear: External ear normal.     Left Ear: External ear normal.  Eyes:     Conjunctiva/sclera: Conjunctivae normal.     Pupils: Pupils are equal, round, and reactive to light.     Comments: glasses  Cardiovascular:     Rate and Rhythm: Normal rate and regular rhythm.     Pulses: Normal pulses.     Heart sounds: Normal  heart sounds.  Pulmonary:     Effort: Pulmonary effort is normal.     Breath sounds:  Normal breath sounds. No wheezing, rhonchi or rales.  Abdominal:     General: Bowel sounds are normal.     Palpations: Abdomen is soft.     Tenderness: There is no abdominal tenderness. There is no guarding or rebound.  Musculoskeletal:        General: Normal range of motion.     Cervical back: Neck supple.     Right lower leg: No edema.     Left lower leg: No edema.  Neurological:     Mental Status: She is alert.     Cranial Nerves: No cranial nerve deficit.     Motor: Weakness present.     Gait: Gait abnormal.     Comments: Chronic left hemiparesis--had used quad cane prior to fall  Psychiatric:        Mood and Affect: Mood normal.     Labs reviewed: Basic Metabolic Panel: No results for input(s): NA, K, CL, CO2, GLUCOSE, BUN, CREATININE, CALCIUM, MG, PHOS in the last 8760 hours. Liver Function Tests: No results for input(s): AST, ALT, ALKPHOS, BILITOT, PROT, ALBUMIN in the last 8760 hours. No results for input(s): LIPASE, AMYLASE in the last 8760 hours. No results for input(s): AMMONIA in the last 8760 hours. CBC: No results for input(s): WBC, NEUTROABS, HGB, HCT, MCV, PLT in the last 8760 hours. Cardiac Enzymes: No results for input(s): CKTOTAL, CKMB, CKMBINDEX, TROPONINI in the last 8760 hours. BNP: Invalid input(s): POCBNP Lab Results  Component Value Date   HGBA1C 8.1 (H) 10/18/2020   Lab Results  Component Value Date   TSH 2.42 06/09/2019   Lab Results  Component Value Date   VITAMINB12 2,765 (H) 07/17/2016   Lab Results  Component Value Date   FOLATE 26.1 07/17/2016   Lab Results  Component Value Date   IRON 108 04/04/2017   TIBC 315 09/18/2016   FERRITIN 74.9 04/04/2017    Imaging and Procedures obtained prior to SNF admission: CT Head Wo Contrast  Result Date: 10/17/2020 CLINICAL DATA:  Head trauma EXAM: CT HEAD WITHOUT CONTRAST CT CERVICAL SPINE WITHOUT  CONTRAST TECHNIQUE: Multidetector CT imaging of the head and cervical spine was performed following the standard protocol without intravenous contrast. Multiplanar CT image reconstructions of the cervical spine were also generated. COMPARISON:  CT brain and cervical spine 07/08/2016 FINDINGS: CT HEAD FINDINGS Brain: No acute territorial infarction, hemorrhage, or intracranial mass. Chronic right MCA infarct as before. Moderate white matter hypodensity consistent with chronic small vessel ischemic change. Moderate atrophy. Stable ventricle size. Vascular: No hyperdense vessels.  Carotid vascular calcification Skull: Normal. Negative for fracture or focal lesion. Sinuses/Orbits: No acute finding. Other: Moderate left frontotemporal scalp hematoma. CT CERVICAL SPINE FINDINGS Alignment: Reversal of cervical lordosis. Trace anterolisthesis C3 on C4. Facet alignment is maintained. Skull base and vertebrae: No acute fracture. No primary bone lesion or focal pathologic process. Soft tissues and spinal canal: Normal prevertebral soft tissue thickness. No visible canal hematoma Disc levels: Multiple level degenerative change, most advanced C4 through C7 where there is disc space narrowing, osteophyte and endplate changes. Facet degenerative changes at multiple levels. Upper chest: Negative. Other: None IMPRESSION: 1. No CT evidence for acute intracranial abnormality. Chronic right MCA infarct. Atrophy and chronic small vessel ischemic changes of the white matter. Moderate left frontotemporal scalp hematoma. 2. Reversal of cervical lordosis with degenerative changes. No acute osseous abnormality. Electronically Signed   By: Donavan Foil M.D.   On: 10/17/2020 23:23   CT Chest Wo Contrast  Result Date:  10/18/2020 CLINICAL DATA:  Chest trauma.  Possible rib fracture. EXAM: CT CHEST WITHOUT CONTRAST TECHNIQUE: Multidetector CT imaging of the chest was performed following the standard protocol without IV contrast. COMPARISON:   None. FINDINGS: Cardiovascular: Calcific aortic atherosclerosis. Normal heart size. No pericardial effusion. Mediastinum/Nodes: No enlarged mediastinal or axillary lymph nodes. Thyroid gland, trachea, and esophagus demonstrate no significant findings. Lungs/Pleura: Lungs are clear. No pleural effusion or pneumothorax. Upper Abdomen: No acute abnormality. Musculoskeletal: Anterolateral left fifth rib fracture. IMPRESSION: 1. Anterolateral left fifth rib fracture. Aortic Atherosclerosis (ICD10-I70.0). Electronically Signed   By: Ulyses Jarred M.D.   On: 10/18/2020 01:06   CT Cervical Spine Wo Contrast  Result Date: 10/17/2020 CLINICAL DATA:  Head trauma EXAM: CT HEAD WITHOUT CONTRAST CT CERVICAL SPINE WITHOUT CONTRAST TECHNIQUE: Multidetector CT imaging of the head and cervical spine was performed following the standard protocol without intravenous contrast. Multiplanar CT image reconstructions of the cervical spine were also generated. COMPARISON:  CT brain and cervical spine 07/08/2016 FINDINGS: CT HEAD FINDINGS Brain: No acute territorial infarction, hemorrhage, or intracranial mass. Chronic right MCA infarct as before. Moderate white matter hypodensity consistent with chronic small vessel ischemic change. Moderate atrophy. Stable ventricle size. Vascular: No hyperdense vessels. Carotid vascular calcification Skull: Normal. Negative for fracture or focal lesion. Sinuses/Orbits: No acute finding. Other: Moderate left frontotemporal scalp hematoma. CT CERVICAL SPINE FINDINGS Alignment: Reversal of cervical lordosis. Trace anterolisthesis C3 on C4. Facet alignment is maintained. Skull base and vertebrae: No acute fracture. No primary bone lesion or focal pathologic process. Soft tissues and spinal canal: Normal prevertebral soft tissue thickness. No visible canal hematoma Disc levels: Multiple level degenerative change, most advanced C4 through C7 where there is disc space narrowing, osteophyte and endplate  changes. Facet degenerative changes at multiple levels. Upper chest: Negative. Other: None IMPRESSION: 1. No CT evidence for acute intracranial abnormality. Chronic right MCA infarct. Atrophy and chronic small vessel ischemic changes of the white matter. Moderate left frontotemporal scalp hematoma. 2. Reversal of cervical lordosis with degenerative changes. No acute osseous abnormality. Electronically Signed   By: Donavan Foil M.D.   On: 10/17/2020 23:24   CT Thoracic Spine Wo Contrast  Result Date: 10/18/2020 CLINICAL DATA:  Mid back pain EXAM: CT THORACIC SPINE WITHOUT CONTRAST TECHNIQUE: Multidetector CT images of the thoracic were obtained using the standard protocol without intravenous contrast. COMPARISON:  None. FINDINGS: Alignment: Normal. Vertebrae: Large Schmorl's nodes at T12 and L1.  No acute fracture. Paraspinal and other soft tissues: Calcific aortic atherosclerosis. Disc levels: No spinal canal stenosis. IMPRESSION: 1. No acute fracture or static subluxation of the thoracic spine. 2. Large Schmorl's nodes at T12 and L1. Aortic Atherosclerosis (ICD10-I70.0). Electronically Signed   By: Ulyses Jarred M.D.   On: 10/18/2020 01:11   DG Shoulder Left  Result Date: 10/18/2020 CLINICAL DATA:  Fall with left shoulder pain EXAM: LEFT SHOULDER - 2+ VIEW COMPARISON:  None. FINDINGS: Patient is paralyzed on the left side, limiting positioning for this study. No visible fracture. Glenohumeral joint appears located. IMPRESSION: Limited examination due to positioning. No visible fracture or dislocation of the left shoulder. Is Electronically Signed   By: Ulyses Jarred M.D.   On: 10/18/2020 01:12    Assessment/Plan 1. Closed fracture of one rib of left side with routine healing, subsequent encounter -encouraged regular deep breathing to prevent atelectasis  2. Vascular dementia without behavioral disturbance ([place]) -is on aricept and namenda -was very clear during our visit today  3. Poor  balance -  here for PT, OT post fall and possibly for long-term care  4. Controlled type 2 diabetes mellitus without complication, without long-term current use of insulin (H[facility]) -control ok considering her age and comorbidities and now snf needs especially Lab Results  Component Value Date   HGBA1C 8.1 (H) 10/18/2020  -no med changes  5. Primary hypertension -bp at goal, cont same regimen and monitor  6. Other hyperlipidemia -cont home lipitor  7. CVA, old, hemiparesis ([place]) -had ambulated with quad cane prior to fall  8. Pressure injury of right heel, stage 1 -cont offloading pressure, cushioned dressing  Family/ staff Communication: d/w snf nurse  Labs/tests ordered:  Cbc, bmp at one wk  Jessicaann Overbaugh L. Lenia Housley, D.O. [facility] [address], [place] [address] Cell Phone (Mon-Fri 8am-5pm):  [phone] On Call:  [phone] & follow prompts after 5pm & weekends Office Phone:  [phone] Office Fax:  [phone]

## 2020-11-03 ENCOUNTER — Other Ambulatory Visit: Payer: Self-pay | Admitting: Orthopedic Surgery

## 2020-11-03 DIAGNOSIS — G4700 Insomnia, unspecified: Secondary | ICD-10-CM

## 2020-11-03 MED ORDER — ZOLPIDEM TARTRATE 5 MG PO TABS: 5.0000 mg | ORAL_TABLET | Freq: Every day | ORAL | 0 refills | Status: DC

## 2020-11-23 ENCOUNTER — Encounter: Payer: Self-pay | Admitting: Orthopedic Surgery

## 2020-11-23 ENCOUNTER — Non-Acute Institutional Stay (SKILLED_NURSING_FACILITY): Payer: Medicare Other | Admitting: Orthopedic Surgery

## 2020-11-23 DIAGNOSIS — F418 Other specified anxiety disorders: Secondary | ICD-10-CM

## 2020-11-23 DIAGNOSIS — S2232XD Fracture of one rib, left side, subsequent encounter for fracture with routine healing: Secondary | ICD-10-CM | POA: Diagnosis not present

## 2020-11-23 DIAGNOSIS — K5901 Slow transit constipation: Secondary | ICD-10-CM | POA: Diagnosis not present

## 2020-11-23 DIAGNOSIS — E119 Type 2 diabetes mellitus without complications: Secondary | ICD-10-CM

## 2020-11-23 DIAGNOSIS — F0150 Vascular dementia without behavioral disturbance: Secondary | ICD-10-CM | POA: Diagnosis not present

## 2020-11-23 DIAGNOSIS — R2689 Other abnormalities of gait and mobility: Secondary | ICD-10-CM

## 2020-11-23 DIAGNOSIS — E7849 Other hyperlipidemia: Secondary | ICD-10-CM

## 2020-11-23 DIAGNOSIS — I10 Essential (primary) hypertension: Secondary | ICD-10-CM

## 2020-11-23 NOTE — Progress Notes (Signed)
Location:    [facility] Room Number: 425/P Place of Service:  SNF [phone]) Provider:  Windell Moulding NP  Binnie Rail, MD  Patient Care Team: Binnie Rail, MD as PCP - General (Internal Medicine) Clearnce Sorrel, MD (Neurology) Camelia Phenes, DPM (Inactive) (Podiatry) Inda Castle, MD (Inactive) (Gastroenterology) Binnie Rail, MD as Consulting Physician (Internal Medicine) Charlton Haws, [facility] as Pharmacist (Pharmacist)  Extended Emergency Contact Information Primary Emergency Contact: Johnson,Hazel Address: [address]          [place], [place] [address] Johnnette Litter of [place] Phone: [phone] Mobile Phone: [phone] Relation: Daughter Secondary Emergency Contact: Hanley Hays Address: [address]          [place], [place] [address] Johnnette Litter of [place] Phone: [phone] Relation: Grandson  Code Status: DNR  Goals of care: Advanced Directive information Advanced Directives 11/23/2020  Does Patient Have a Medical Advance Directive? Yes  Type of Advance Directive Living will;Out of facility DNR (pink MOST or yellow form)  Does patient want to make changes to medical advance directive? No - Patient declined  Copy of [place] in Chart? -  Pre-existing out of facility DNR order (yellow form or pink MOST form) Yellow form placed in chart (order not valid for inpatient use)     Chief Complaint  Patient presents with  . Medical Management of Chronic Issues    Routine Visit of Medical Management     HPI:  Pt is a 87 y.o. female seen today for medical management of chronic diseases.    She currently resides at [facility]. PMH includes: hypertension, hyperlipidemia, diabetes, CVA, and left sided weakness.   Today, she is alert and oriented x 3, follows commands and can express needs. She was originally on skilled unit, moved to [place] a few days ago. She is upset she  cannot return to [facility]. Misses outings to [employer] offered. Since her fall in February, she has had trouble ambulating. Reports her left sided pain has resolved. She is able to walk about 10 feet with walker. Uses wheelchair for long distances. Minimal assistance with ADLs. Her appetite is fair, also consuming Glucerna shakes. Last bowel movement a few days ago. She is requesting daily prune juice with breakfast. Denies chest pain, sob.   No recent falls or injuries.   Blood sugars averaging 100-200s. No hypoglycemic events.   Recent blood pressures are as follows:  03/30- 110/63  03/29- 118/65  03/28- 128/72  Recent weights are as follows:  03/16- 117.8 lbs  02/24- 118.2 lbs  Facility nurse does not report any concerns, vitals stable.    Past Medical History:  Diagnosis Date  . Allergic rhinitis   . Anemia   . Arthritis   . Depression    anxiety overlap  . Diabetes mellitus, type 2 ([place])   . Distal radius fracture   . Hyperlipidemia   . Hypertension   . Osteoporosis, senile    accidental fall w/ pelvic fx 2006, L wrist fx 10/2013  . Pelvis fracture ([place])   . Right wrist fracture 05/14/2015  . Stroke [facility]) 1979   R MCA>>residual spastic L HP; TIA 06/2011   Past Surgical History:  Procedure Laterality Date  . ABDOMINAL HYSTERECTOMY  1960   Fibroid tumors  . COLONOSCOPY  06/06/2012   Procedure: COLONOSCOPY;  Surgeon: Inda Castle, MD;  Location: [facility] END[facility]COPY;  Service:  Endoscopy;  Laterality: N/A;  Rm 1501. Pt to be discharged after procedure. Patient was only admitted for prep.    No Known Allergies  Allergies as of 11/23/2020   No Known Allergies     Medication List       Accurate as of November 23, 2020 10:49 AM. If you have any questions, ask your nurse or doctor.        STOP taking these medications   HYDROcodone-acetaminophen 5-325 MG tablet Commonly known as: NORCO/VICODIN Stopped by: Yvonna Alanis, NP     TAKE these  medications   acetaminophen 500 MG tablet Commonly known as: TYLENOL Take 1 tablet (500 mg total) by mouth every 6 (six) hours as needed for mild pain.   atorvastatin 20 MG tablet Commonly known as: LIPITOR TAKE 1 TABLET BY MOUTH  DAILY   bisacodyl 10 MG suppository Commonly known as: DULCOLAX Place 10 mg rectally as needed for moderate constipation.   citalopram 20 MG tablet Commonly known as: CELEXA TAKE 1 TABLET BY MOUTH  DAILY   cromolyn 4 % ophthalmic solution Commonly known as: OPTICROM Place 1 drop into both eyes 2 (two) times daily.   denosumab 60 MG/ML Soln injection Commonly known as: PROLIA Inject 60 mg into the skin every 6 (six) months. Administer in upper arm, thigh, or abdomen  Usually take on the 23rd of each month   diltiazem 300 MG 24 hr capsule Commonly known as: CARDIZEM CD TAKE 1 CAPSULE BY MOUTH  DAILY   divalproex 250 MG DR tablet Commonly known as: DEPAKOTE Take 250 mg by mouth every evening.   docusate sodium 100 MG capsule Commonly known as: COLACE Take 100 mg by mouth 2 (two) times daily.   donepezil 10 MG tablet Commonly known as: ARICEPT TAKE 1 TABLET BY MOUTH AT  BEDTIME   Fish Oil 1000 MG Caps Take 1 capsule by mouth every morning.   Glucerna Liqd Take 237 mLs by mouth 2 (two) times daily.   Insulin Pen Needle 32G X 4 MM Misc Use to administer insulin four times a day Dx E11.9   Lantus SoloStar 100 UNIT/ML Solostar Pen Generic drug: insulin glargine INJECT SUBCUTANEOUSLY 10  UNITS DAILY   magnesium hydroxide 400 MG/5ML suspension Commonly known as: MILK OF MAGNESIA Take by mouth daily as needed for mild constipation.   memantine 5 MG tablet Commonly known as: NAMENDA TAKE 1 TABLET BY MOUTH  TWICE DAILY   mirtazapine 30 MG tablet Commonly known as: Remeron Take 1 tablet (30 mg total) by mouth at bedtime.   multivitamin capsule Take 1 capsule by mouth daily.   olmesartan 20 MG tablet Commonly known as: BENICAR TAKE 1  TABLET BY MOUTH  DAILY   OneTouch Delica Plus [id number] Misc USE WITH LANCING DEVICE TO  CHECK BLOOD SUGAR TWO TIMES DAILY AS DIRECTED. E11.9   OneTouch Verio Flex System w/Device Kit USE TO CHECK BLOOD SUGAR  TWO TIMES DAILY   OneTouch Verio test strip Generic drug: glucose blood CHECK BLOOD SUGAR TWO TIMES DAILY AS DIRECTED.  E11.9   polyethylene glycol 17 g packet Commonly known as: MIRALAX / GLYCOLAX Take 17 g by mouth daily as needed.   Raised Toilet Seat Misc Use as directed   vitamin B-12 100 MCG tablet Commonly known as: CYANOCOBALAMIN Take 100 mcg by mouth daily.   zolpidem 5 MG tablet Commonly known as: AMBIEN Take 1 tablet (5 mg total) by mouth at bedtime.       Review  of Systems  Constitutional: Negative for activity change, appetite change, fatigue and fever.  HENT: Negative for dental problem, hearing loss and trouble swallowing.   Eyes: Negative for visual disturbance.  Respiratory: Negative for cough, shortness of breath and wheezing.   Cardiovascular: Negative for chest pain and leg swelling.  Gastrointestinal: Positive for constipation. Negative for abdominal distention, abdominal pain, diarrhea and nausea.  Genitourinary: Negative for dysuria, frequency and hematuria.  Musculoskeletal: Positive for arthralgias and myalgias.  Skin:       Pressure injury  Neurological: Positive for weakness. Negative for dizziness and headaches.       Right sided weakness  Hematological: Bruises/bleeds easily.  Psychiatric/Behavioral: Negative for confusion, dysphoric mood and sleep disturbance. The patient is not nervous/anxious.     Immunization History  Administered Date(s) Administered  . Fluad Quad(high Dose 65+) 06/09/2019  . Influenza, High Dose Seasonal PF 07/11/2016, 06/07/2017  . Influenza, Seasonal, Injecte, Preservative Fre 08/12/2012  . Influenza,inj,Quad PF,6+ Mos 09/08/2013, 05/16/2015  . Influenza-Unspecified 05/27/2014, 04/27/2020  . Moderna  Sars-Covid-2 Vaccination 08/28/2019, 06/27/2020  . Pneumococcal Conjugate-13 04/13/2015  . Pneumococcal Polysaccharide-23 09/20/2011  . Td 04/21/2013  . Unspecified SARS-COV-2 Vaccination 09/28/2019  . Zoster 04/14/2015   Pertinent  Health Maintenance Due  Topic Date Due  . OPHTHALMOLOGY EXAM  02/25/2016  . FOOT EXAM  02/18/2019  . INFLUENZA VACCINE  Completed  . DEXA SCAN  Completed  . PNA vac Low Risk Adult  Completed   Fall Risk  02/18/2018 02/10/2018 04/07/2016 04/13/2015 09/08/2013  Falls in the past year? No No Yes Yes No  Number falls in past yr: - - 1 2 or more -  Injury with Fall? - - Yes Yes -  Comment - - - hit head after one of the falls.  -  Risk Factor Category  - - High Fall Risk - -  Risk for fall due to : Impaired mobility;Impaired balance/gait;History of fall(s) - History of fall(s);Impaired balance/gait;Impaired mobility - -   Functional Status Survey:    Vitals:   11/23/20 1038  BP: 138/64  Pulse: 84  Resp: 18  Temp: (!) 97.2 F (36.2 C)  SpO2: 95%  Weight: 117 lb 12.8 oz (53.4 kg)  Height: _0  (1.549 m)   Body mass index is 22.26 kg/m. Physical Exam Vitals reviewed.  Constitutional:      General: She is not in acute distress. HENT:     Head: Normocephalic.     Mouth/Throat:     Mouth: Mucous membranes are moist.     Pharynx: No posterior oropharyngeal erythema.  Eyes:     General:        Right eye: No discharge.        Left eye: No discharge.  Cardiovascular:     Rate and Rhythm: Normal rate and regular rhythm.     Pulses: Normal pulses.     Heart sounds: Normal heart sounds. No murmur heard.   Pulmonary:     Effort: Pulmonary effort is normal. No respiratory distress.     Breath sounds: Normal breath sounds. No wheezing.  Abdominal:     General: Bowel sounds are normal. There is no distension.     Palpations: Abdomen is soft.     Tenderness: There is no abdominal tenderness.  Musculoskeletal:     Cervical back: Normal range of  motion.     Right lower leg: No edema.     Left lower leg: No edema.  Lymphadenopathy:     Cervical:  No cervical adenopathy.  Skin:    General: Skin is warm and dry.     Capillary Refill: Capillary refill takes less than 2 seconds.     Comments: "DEEP TISSUE PRESSURE INJURY TO MEDIAL RIGHT HEEL: measures 1.5 x 2.5 x 0.0cm, dark purple discoloration to intact skin. No exudate noted. Periwound intact. No periwound maceration noted."  Neurological:     General: No focal deficit present.     Mental Status: She is alert and oriented to person, place, and time.     Motor: Weakness present.     Gait: Gait abnormal.     Comments: wheelchair  Psychiatric:        Mood and Affect: Mood normal.        Behavior: Behavior normal.     Labs reviewed: No results for input(s): NA, K, CL, CO2, GLUCOSE, BUN, CREATININE, CALCIUM, MG, PHOS in the last 8760 hours. No results for input(s): AST, ALT, ALKPHOS, BILITOT, PROT, ALBUMIN in the last 8760 hours. No results for input(s): WBC, NEUTROABS, HGB, HCT, MCV, PLT in the last 8760 hours. Lab Results  Component Value Date   TSH 2.42 06/09/2019   Lab Results  Component Value Date   HGBA1C 8.1 (H) 10/18/2020   Lab Results  Component Value Date   CHOL 160 06/09/2019   HDL 82.10 06/09/2019   LDLCALC 64 06/09/2019   LDLDIRECT 108.6 04/21/2013   TRIG 66.0 06/09/2019   CHOLHDL 2 06/09/2019    Significant Diagnostic Results in last 30 days:  No results found.  Assessment/Plan 1. Closed fracture of one rib of left side with routine healing, subsequent encounter - resolved, denies left sided pain  2. Vascular dementia without behavioral disturbance (HCC) - no recent behavioral outbursts - cont namenda, aricept and depakote  3. Poor balance - left sided weakness due to past stroke - cont falls safety precautions  4. Controlled type 2 diabetes mellitus without complication, without long-term current use of insulin (HCC) - sugars stable with  daily Lantus  5. Primary hypertension - at goal with benicar  6. Other hyperlipidemia - stable with statin  7. Depression with anxiety - trying to accept she is no longer at [facility] - appetite fair, no recent weight loss - cont celexa and remeron  8. Slow transit constipation - abdomen soft with normal bowel sounds - suspect due to decreased mobility - recommend prune juice QAM - encourage oral hydration   Family/ staff Communication: Plan discussed with patient and facility nurse  Labs/tests ordered:  none

## 2020-12-23 DIAGNOSIS — G8194 Hemiplegia, unspecified affecting left nondominant side: Secondary | ICD-10-CM | POA: Diagnosis not present

## 2020-12-23 DIAGNOSIS — G479 Sleep disorder, unspecified: Secondary | ICD-10-CM | POA: Diagnosis not present

## 2020-12-23 DIAGNOSIS — W19XXXA Unspecified fall, initial encounter: Secondary | ICD-10-CM | POA: Diagnosis not present

## 2020-12-24 DIAGNOSIS — I10 Essential (primary) hypertension: Secondary | ICD-10-CM | POA: Diagnosis not present

## 2021-03-16 ENCOUNTER — Telehealth: Payer: Self-pay | Admitting: Pharmacist

## 2021-03-16 NOTE — Progress Notes (Signed)
Chronic Care Management Pharmacy Assistant   Name: Patricia Andrews  MRN: [id number] DOB: [birth year]  Reason for Encounter: Disease State-General Adherence   Recent office visits:  None noted   Recent consult visits:  12/23/20 Nyra Capes [facility]) - Hemiplegia, Sleep disorder.  Hospital visits:  Medication Reconciliation was completed by comparing discharge summary, patient's EMR and Pharmacy list, and upon discussion with patient.  Admitted to the hospital on 10/17/20 due to Head Injury due to Fall. Discharge date was 10/19/20. Discharged from [facility]?Medications Started at [facility] Discharge:?? -started Hydrocodone-tylenol 5-325 mg   Medications that remain the same after Hospital Discharge:??  -All other medications will remain the same.    Medications: Outpatient Encounter Medications as of 03/16/2021  Medication Sig   acetaminophen (TYLENOL) 500 MG tablet Take 1 tablet (500 mg total) by mouth every 6 (six) hours as needed for mild pain.   atorvastatin (LIPITOR) 20 MG tablet TAKE 1 TABLET BY MOUTH  DAILY   bisacodyl (DULCOLAX) 10 MG suppository Place 10 mg rectally as needed for moderate constipation.   Blood Glucose Monitoring Suppl (Industry) w/Device KIT USE TO CHECK BLOOD SUGAR  TWO TIMES DAILY (Patient not taking: Reported on 11/23/2020)   citalopram (CELEXA) 20 MG tablet TAKE 1 TABLET BY MOUTH  DAILY   cromolyn (OPTICROM) 4 % ophthalmic solution Place 1 drop into both eyes 2 (two) times daily.   denosumab (PROLIA) 60 MG/ML SOLN injection Inject 60 mg into the skin every 6 (six) months. Administer in upper arm, thigh, or abdomen  Usually take on the 23rd of each month   diltiazem (CARDIZEM CD) 300 MG 24 hr capsule TAKE 1 CAPSULE BY MOUTH  DAILY   divalproex (DEPAKOTE) 250 MG DR tablet Take 250 mg by mouth every evening.   docusate sodium (COLACE) 100 MG capsule Take 100 mg by mouth 2 (two) times daily.   donepezil (ARICEPT) 10 MG  tablet TAKE 1 TABLET BY MOUTH AT  BEDTIME   Glucerna (GLUCERNA) LIQD Take 237 mLs by mouth 2 (two) times daily.   glucose blood (ONETOUCH VERIO) test strip CHECK BLOOD SUGAR TWO TIMES DAILY AS DIRECTED.  E11.9 (Patient not taking: Reported on 11/23/2020)   Insulin Pen Needle 32G X 4 MM MISC Use to administer insulin four times a day Dx E11.9 (Patient not taking: Reported on 11/23/2020)   Lancets (ONETOUCH DELICA PLUS [id number]) MISC USE WITH LANCING DEVICE TO  CHECK BLOOD SUGAR TWO TIMES DAILY AS DIRECTED. E11.9 (Patient not taking: Reported on 11/23/2020)   LANTUS SOLOSTAR 100 UNIT/ML Solostar Pen INJECT SUBCUTANEOUSLY 10  UNITS DAILY   magnesium hydroxide (MILK OF MAGNESIA) 400 MG/5ML suspension Take by mouth daily as needed for mild constipation.   memantine (NAMENDA) 5 MG tablet TAKE 1 TABLET BY MOUTH  TWICE DAILY   mirtazapine (REMERON) 30 MG tablet Take 1 tablet (30 mg total) by mouth at bedtime.   Misc. Devices (RAISED TOILET SEAT) MISC Use as directed (Patient not taking: Reported on 11/23/2020)   Multiple Vitamin (MULTIVITAMIN) capsule Take 1 capsule by mouth daily.   olmesartan (BENICAR) 20 MG tablet TAKE 1 TABLET BY MOUTH  DAILY   Omega-3 Fatty Acids (FISH OIL) 1000 MG CAPS Take 1 capsule by mouth every morning.   polyethylene glycol (MIRALAX / GLYCOLAX) 17 g packet Take 17 g by mouth daily as needed.   vitamin B-12 (CYANOCOBALAMIN) 100 MCG tablet Take 100 mcg by mouth daily.   zolpidem (  AMBIEN) 5 MG tablet Take 1 tablet (5 mg total) by mouth at bedtime.   No facility-administered encounter medications on file as of 03/16/2021.    Have you had any problems recently with your health? Spoke with patient's daughter she is still at [facility], she will be there permanently due to losing her ability to walk.  Have you had any problems with your pharmacy? Patient daughter states no problem with pharmacy.   What issues or side effects are you having with your  medications? Patient daughter states she tolerates all her medications ok.   What would you like me to pass along to [facility] for them to help you with?  Patient daughter states that she will bring an updated list of her current medications when she comes for her Prolia injection on 04/24/21 at Dr. Nathanial Millman.   What can we do to take care of you better? Patient daughter states nothing at this time, she is happy to know her mother can continue to get her Prolia injection as long as she see Dr. Sharlet Salina because the nursing facility is unable to administer.     Star Rating Drugs: Atorvastatin - last fill 01/03/21 30D Olmesartan - last fill 12/15/20 30D - Patient daughter states she is not sure if patient is still taking.  Orinda Kenner, [place] Clinical Pharmacists Assistant [phone]  Time Spent: [phone]

## 2021-04-03 ENCOUNTER — Telehealth: Payer: Self-pay | Admitting: Internal Medicine

## 2021-04-03 NOTE — Telephone Encounter (Signed)
Patient is currently living in nursing facility.  Daughter wants to know if she still needs to bring patient in for her Prolia shot Thursday 08/11 @ 3pm or do they now provide that at the nursing facility.   Req callback [phone]

## 2021-04-04 NOTE — Telephone Encounter (Signed)
Spoke with daughter today and she wants her to stay with Dr. Lawerance Bach.  Appointment made

## 2021-04-04 NOTE — Telephone Encounter (Signed)
She has not been seen here in over a year so she should have all of her care transferred there or needs to have a follow up - should be seen twice a year.  If her care  is transferred there they can do the prolia

## 2021-04-06 ENCOUNTER — Ambulatory Visit: Payer: Medicare Other

## 2021-04-23 NOTE — Progress Notes (Signed)
Subjective:    Patient ID: Patricia Andrews, female    DOB: 08-30-1925, 87 y.o.   MRN: [id number]  HPI The patient is here for follow up of their chronic medical problems, including DM, OP, htn, hi chol, dementia, depression/anxiety, insomnia  She is here with her daughter.  Doing well overall and no concerns.    Here for f/u of OP .  Other medial problems managed by [facility].   Medications and allergies reviewed with patient and updated if appropriate.  Patient Active Problem List   Diagnosis Date Noted   Pressure sore on heel, right, stage I 11/13/2019   Insomnia 09/10/2018   Sensorineural hearing loss 02/10/2017   Dyspnea on exertion 12/31/2016   Dementia (HCC) 12/30/2016   Hyperkalemia 07/24/2016   Poor balance 07/11/2016   Upper back pain 07/11/2016   Pain due to onychomycosis of toenail 02/04/2013   Diabetes type 2, controlled (HCC) 09/20/2011   Hypertension 09/20/2011   Hyperlipidemia 09/20/2011   Senile osteoporosis 09/20/2011   Depression with anxiety 09/20/2011   Anemia 09/20/2011   CVA, old, hemiparesis (HCC)     Current Outpatient Medications on File Prior to Visit  Medication Sig Dispense Refill   acetaminophen (TYLENOL) 500 MG tablet Take 1 tablet (500 mg total) by mouth every 6 (six) hours as needed for mild pain. 90 tablet 5   atorvastatin (LIPITOR) 20 MG tablet TAKE 1 TABLET BY MOUTH  DAILY 90 tablet 3   bisacodyl (DULCOLAX) 10 MG suppository Place 10 mg rectally as needed for moderate constipation.     citalopram (CELEXA) 20 MG tablet TAKE 1 TABLET BY MOUTH  DAILY 90 tablet 1   cromolyn (OPTICROM) 4 % ophthalmic solution Place 1 drop into both eyes 2 (two) times daily.     denosumab (PROLIA) 60 MG/ML SOLN injection Inject 60 mg into the skin every 6 (six) months. Administer in upper arm, thigh, or abdomen  Usually take on the 23rd of each month     diltiazem (CARDIZEM CD) 300 MG 24 hr capsule TAKE 1 CAPSULE BY MOUTH  DAILY 90 capsule 1   divalproex  (DEPAKOTE) 250 MG DR tablet Take 250 mg by mouth every evening.     docusate sodium (COLACE) 100 MG capsule Take 100 mg by mouth 2 (two) times daily.     donepezil (ARICEPT) 10 MG tablet TAKE 1 TABLET BY MOUTH AT  BEDTIME 90 tablet 1   Glucerna (GLUCERNA) LIQD Take 237 mLs by mouth 2 (two) times daily.     glucose blood (ONETOUCH VERIO) test strip CHECK BLOOD SUGAR TWO TIMES DAILY AS DIRECTED.  E11.9 (Patient taking differently: CHECK BLOOD SUGAR TWO TIMES DAILY AS DIRECTED.  E11.9) 200 strip 3   Insulin Pen Needle 32G X 4 MM MISC Use to administer insulin four times a day Dx E11.9 30 each 0   Lancets (ONETOUCH DELICA PLUS LANCET33G) MISC USE WITH LANCING DEVICE TO  CHECK BLOOD SUGAR TWO TIMES DAILY AS DIRECTED. E11.9 (Patient taking differently: USE WITH LANCING DEVICE TO  CHECK BLOOD SUGAR TWO TIMES DAILY AS DIRECTED. E11.9) 200 each 3   LANTUS SOLOSTAR 100 UNIT/ML Solostar Pen INJECT SUBCUTANEOUSLY 10  UNITS DAILY 15 mL 1   magnesium hydroxide (MILK OF MAGNESIA) 400 MG/5ML suspension Take by mouth daily as needed for mild constipation.     memantine (NAMENDA) 5 MG tablet TAKE 1 TABLET BY MOUTH  TWICE DAILY 180 tablet 3   mirtazapine (REMERON) 30 MG tablet Take 1  tablet (30 mg total) by mouth at bedtime. 90 tablet 1   Multiple Vitamin (MULTIVITAMIN) capsule Take 1 capsule by mouth daily. 90 capsule 3   olmesartan (BENICAR) 20 MG tablet TAKE 1 TABLET BY MOUTH  DAILY 90 tablet 3   Omega-3 Fatty Acids (FISH OIL) 1000 MG CAPS Take 1 capsule by mouth every morning. 90 capsule 3   polyethylene glycol (MIRALAX / GLYCOLAX) 17 g packet Take 17 g by mouth daily as needed.     vitamin B-12 (CYANOCOBALAMIN) 100 MCG tablet Take 100 mcg by mouth daily.     Zolpidem Tartrate (AMBIEN PO) Take 2.5 mg by mouth at bedtime.     zolpidem (AMBIEN) 5 MG tablet Take 1 tablet (5 mg total) by mouth at bedtime. (Patient not taking: Reported on 04/24/2021) 30 tablet 0   No current facility-administered medications on file  prior to visit.    Past Medical History:  Diagnosis Date   Allergic rhinitis    Anemia    Arthritis    Depression    anxiety overlap   Diabetes mellitus, type 2 (HCC)    Distal radius fracture    Hyperlipidemia    Hypertension    Osteoporosis, senile    accidental fall w/ pelvic fx 2006, L wrist fx 10/2013   Pelvis fracture (HCC)    Right wrist fracture 05/14/2015   Stroke (HCC) 1979   R MCA>>residual spastic L HP; TIA 06/2011    Past Surgical History:  Procedure Laterality Date   ABDOMINAL HYSTERECTOMY  1960   Fibroid tumors   COLONOSCOPY  06/06/2012   Procedure: COLONOSCOPY;  Surgeon: Louis Meckel, MD;  Location: [facility];  Service: Endoscopy;  Laterality: N/A;  Rm 1501. Pt to be discharged after procedure. Patient was only admitted for prep.    Social History   Socioeconomic History   Marital status: Single    Spouse name: Not on file   Number of children: 2   Years of education: Not on file   Highest education level: Not on file  Occupational History   Occupation: RETIRED    Employer: RETIRED  Tobacco Use   Smoking status: Never   Smokeless tobacco: Never  Vaping Use   Vaping Use: Never used  Substance and Sexual Activity   Alcohol use: No    Alcohol/week: 0.0 standard drinks   Drug use: No   Sexual activity: Never  Other Topics Concern   Not on file  Social History Narrative   Lives alone in independent living, single. Never married   Supportive daughter [name] -   Social Determinants of Health   Financial Resource Strain: Low Risk    Difficulty of Paying Living Expenses: Not hard at all  Food Insecurity: Not on file  Transportation Needs: Not on file  Physical Activity: Not on file  Stress: Not on file  Social Connections: Not on file    Family History  Problem Relation Age of Onset   Rheum arthritis Mother     Review of Systems  Constitutional:  Negative for fever.  Respiratory:  Negative for cough, shortness of breath and  wheezing.   Cardiovascular:  Positive for leg swelling (LLE only). Negative for chest pain and palpitations.  Gastrointestinal:  Negative for abdominal pain.  Neurological:  Negative for light-headedness and headaches.      Objective:   Vitals:   04/24/21 1506  BP: 140/72  Pulse: 75  Temp: 98.7 F (37.1 C)  SpO2: 95%   BP Readings from  Last 3 Encounters:  04/24/21 140/72  11/23/20 138/64  10/21/20 120/73   Wt Readings from Last 3 Encounters:  11/23/20 117 lb 12.8 oz (53.4 kg)  10/21/20 118 lb 3.2 oz (53.6 kg)  10/20/20 124 lb (56.2 kg)   Body mass index is 22.26 kg/m.   Physical Exam    Constitutional: Appears well-developed and well-nourished. No distress.  HENT:  Head: Normocephalic and atraumatic.  Neck: Neck supple. No tracheal deviation present. No thyromegaly present.  No cervical lymphadenopathy Cardiovascular: Normal rate, regular rhythm and normal heart sounds.   No murmur heard. No carotid bruit .  Mile LLE edema Pulmonary/Chest: Effort normal and breath sounds normal. No respiratory distress. No has no wheezes. No rales.  Skin: Skin is warm and dry. Not diaphoretic.  Psychiatric: Normal mood and affect. Behavior is normal.      Assessment & Plan:    See Problem List for Assessment and Plan of chronic medical problems.    This visit occurred during the SARS-CoV-2 public health emergency.  Safety protocols were in place, including screening questions prior to the visit, additional usage of staff PPE, and extensive cleaning of exam room while observing appropriate contact time as indicated for disinfecting solutions.

## 2021-04-23 NOTE — Patient Instructions (Addendum)
  Prolia injection given   Medications changes include :   none     Please followup in 6 months - nurse visit for prolia

## 2021-04-24 ENCOUNTER — Other Ambulatory Visit: Payer: Self-pay

## 2021-04-24 ENCOUNTER — Encounter: Payer: Self-pay | Admitting: Internal Medicine

## 2021-04-24 ENCOUNTER — Ambulatory Visit (INDEPENDENT_AMBULATORY_CARE_PROVIDER_SITE_OTHER): Payer: Medicare Other | Admitting: Internal Medicine

## 2021-04-24 VITALS — BP 140/72 | HR 75 | Temp 98.7°F | Ht 61.0 in

## 2021-04-24 DIAGNOSIS — I10 Essential (primary) hypertension: Secondary | ICD-10-CM | POA: Diagnosis not present

## 2021-04-24 DIAGNOSIS — F0150 Vascular dementia without behavioral disturbance: Secondary | ICD-10-CM | POA: Diagnosis not present

## 2021-04-24 DIAGNOSIS — E7849 Other hyperlipidemia: Secondary | ICD-10-CM | POA: Diagnosis not present

## 2021-04-24 DIAGNOSIS — F418 Other specified anxiety disorders: Secondary | ICD-10-CM

## 2021-04-24 DIAGNOSIS — M810 Age-related osteoporosis without current pathological fracture: Secondary | ICD-10-CM

## 2021-04-24 DIAGNOSIS — E119 Type 2 diabetes mellitus without complications: Secondary | ICD-10-CM | POA: Diagnosis not present

## 2021-04-24 DIAGNOSIS — F5101 Primary insomnia: Secondary | ICD-10-CM

## 2021-04-24 LAB — CBC WITH DIFFERENTIAL/PLATELET
Basophils Absolute: 0 10*3/uL (ref 0.0–0.1)
Basophils Relative: 0.4 % (ref 0.0–3.0)
Eosinophils Absolute: 0.1 10*3/uL (ref 0.0–0.7)
Eosinophils Relative: 1.8 % (ref 0.0–5.0)
HCT: 30.1 % — ABNORMAL LOW (ref 36.0–46.0)
Hemoglobin: 9.7 g/dL — ABNORMAL LOW (ref 12.0–15.0)
Lymphocytes Relative: 34.2 % (ref 12.0–46.0)
Lymphs Abs: 2.1 10*3/uL (ref 0.7–4.0)
MCHC: 32.3 g/dL (ref 30.0–36.0)
MCV: 98.8 fl (ref 78.0–100.0)
Monocytes Absolute: 0.6 10*3/uL (ref 0.1–1.0)
Monocytes Relative: 10.3 % (ref 3.0–12.0)
Neutro Abs: 3.3 10*3/uL (ref 1.4–7.7)
Neutrophils Relative %: 53.3 % (ref 43.0–77.0)
Platelets: 152 10*3/uL (ref 150.0–400.0)
RBC: 3.04 Mil/uL — ABNORMAL LOW (ref 3.87–5.11)
RDW: 13.9 % (ref 11.5–15.5)
WBC: 6.2 10*3/uL (ref 4.0–10.5)

## 2021-04-24 LAB — HEMOGLOBIN A1C: Hgb A1c MFr Bld: 7.3 % — ABNORMAL HIGH (ref 4.6–6.5)

## 2021-04-24 LAB — COMPREHENSIVE METABOLIC PANEL
ALT: 15 U/L (ref 0–35)
AST: 20 U/L (ref 0–37)
Albumin: 3.7 g/dL (ref 3.5–5.2)
Alkaline Phosphatase: 85 U/L (ref 39–117)
BUN: 34 mg/dL — ABNORMAL HIGH (ref 6–23)
CO2: 28 mEq/L (ref 19–32)
Calcium: 9.3 mg/dL (ref 8.4–10.5)
Chloride: 100 mEq/L (ref 96–112)
Creatinine, Ser: 1.65 mg/dL — ABNORMAL HIGH (ref 0.40–1.20)
GFR: 26.67 mL/min — ABNORMAL LOW (ref >60.00)
Glucose, Bld: 153 mg/dL — ABNORMAL HIGH (ref 70–99)
Potassium: 4.8 mEq/L (ref 3.5–5.1)
Sodium: 137 mEq/L (ref 135–145)
Total Bilirubin: 0.3 mg/dL (ref 0.2–1.2)
Total Protein: 7.6 g/dL (ref 6.0–8.3)

## 2021-04-24 LAB — LIPID PANEL
Cholesterol: 166 mg/dL (ref 0–200)
HDL: 83.1 mg/dL (ref >39.00)
LDL Cholesterol: 68 mg/dL (ref 0–99)
NonHDL: 82.98
Total CHOL/HDL Ratio: 2
Triglycerides: 76 mg/dL (ref 0.0–149.0)
VLDL: 15.2 mg/dL (ref 0.0–40.0)

## 2021-04-24 NOTE — Assessment & Plan Note (Signed)
Chronic Continue calcium and vitamin d Advised having levels check at [facility] prolia today Continue prolia Q 6 months

## 2021-04-25 LAB — VITAMIN D 25 HYDROXY (VIT D DEFICIENCY, FRACTURES): VITD: 69.38 ng/mL (ref 30.00–100.00)

## 2021-04-25 MED ORDER — DENOSUMAB 60 MG/ML ~~LOC~~ SOSY
60.0000 mg | PREFILLED_SYRINGE | Freq: Once | SUBCUTANEOUS | Status: AC
  Administered 2021-04-24: 60 mg via SUBCUTANEOUS

## 2021-04-25 NOTE — Addendum Note (Signed)
Addended by: Karma Ganja on: 04/25/2021 10:39 AM   Modules accepted: Orders

## 2021-04-26 NOTE — Progress Notes (Signed)
Patient given Prolia given in office today. Medication was provided from office.

## 2021-10-02 ENCOUNTER — Telehealth: Payer: Self-pay

## 2021-10-02 NOTE — Chronic Care Management (AMB) (Signed)
Chronic Care Management Pharmacy Assistant   Name: LOWANA HABLE  MRN: [id number] DOB: 1926/04/29   Reason for Encounter: Disease State-General     Recent office visits:  04/24/21 Pincus Sanes, MD-PCP (Hypertension) Labs- Prolia injection given, med changes: none  Recent consult visits:  None ID  Hospital visits:  None in previous 6 months  Medications: Outpatient Encounter Medications as of 10/02/2021  Medication Sig   acetaminophen (TYLENOL) 500 MG tablet Take 1 tablet (500 mg total) by mouth every 6 (six) hours as needed for mild pain.   atorvastatin (LIPITOR) 20 MG tablet TAKE 1 TABLET BY MOUTH  DAILY   bisacodyl (DULCOLAX) 10 MG suppository Place 10 mg rectally as needed for moderate constipation.   citalopram (CELEXA) 20 MG tablet TAKE 1 TABLET BY MOUTH  DAILY   cromolyn (OPTICROM) 4 % ophthalmic solution Place 1 drop into both eyes 2 (two) times daily.   denosumab (PROLIA) 60 MG/ML SOLN injection Inject 60 mg into the skin every 6 (six) months. Administer in upper arm, thigh, or abdomen  Usually take on the 23rd of each month   diltiazem (CARDIZEM CD) 300 MG 24 hr capsule TAKE 1 CAPSULE BY MOUTH  DAILY   divalproex (DEPAKOTE) 250 MG DR tablet Take 250 mg by mouth every evening.   docusate sodium (COLACE) 100 MG capsule Take 100 mg by mouth 2 (two) times daily.   donepezil (ARICEPT) 10 MG tablet TAKE 1 TABLET BY MOUTH AT  BEDTIME   Glucerna (GLUCERNA) LIQD Take 237 mLs by mouth 2 (two) times daily.   glucose blood (ONETOUCH VERIO) test strip CHECK BLOOD SUGAR TWO TIMES DAILY AS DIRECTED.  E11.9 (Patient taking differently: CHECK BLOOD SUGAR TWO TIMES DAILY AS DIRECTED.  E11.9)   Insulin Pen Needle 32G X 4 MM MISC Use to administer insulin four times a day Dx E11.9   Lancets (ONETOUCH DELICA PLUS LANCET33G) MISC USE WITH LANCING DEVICE TO  CHECK BLOOD SUGAR TWO TIMES DAILY AS DIRECTED. E11.9 (Patient taking differently: USE WITH LANCING DEVICE TO  CHECK BLOOD SUGAR TWO  TIMES DAILY AS DIRECTED. E11.9)   LANTUS SOLOSTAR 100 UNIT/ML Solostar Pen INJECT SUBCUTANEOUSLY 10  UNITS DAILY   magnesium hydroxide (MILK OF MAGNESIA) 400 MG/5ML suspension Take by mouth daily as needed for mild constipation.   memantine (NAMENDA) 5 MG tablet TAKE 1 TABLET BY MOUTH  TWICE DAILY   mirtazapine (REMERON) 30 MG tablet Take 1 tablet (30 mg total) by mouth at bedtime.   Multiple Vitamin (MULTIVITAMIN) capsule Take 1 capsule by mouth daily.   olmesartan (BENICAR) 20 MG tablet TAKE 1 TABLET BY MOUTH  DAILY   Omega-3 Fatty Acids (FISH OIL) 1000 MG CAPS Take 1 capsule by mouth every morning.   polyethylene glycol (MIRALAX / GLYCOLAX) 17 g packet Take 17 g by mouth daily as needed.   vitamin B-12 (CYANOCOBALAMIN) 100 MCG tablet Take 100 mcg by mouth daily.   Zolpidem Tartrate (AMBIEN PO) Take 2.5 mg by mouth at bedtime.   No facility-administered encounter medications on file as of 10/02/2021.   Have you had any problems recently with your health?Spoke with patient daughter [name] who states that patient is doing well and does not have any new health issues. She stated that patient is going to have labs done today.  Have you had any problems with your pharmacy?She states that patient does not have any problems with getting medications or the cost of medications from the pharmacy  What issues  or side effects are you having with your medications?She states that patient has no side effects that she knows of  What would you like me to pass along to [facility] for them to help you with? She states that patient is doing well  What can we do to take care of you better? She states that patient does not need anything at this time  Care Gaps: Colonoscopy-NA Diabetic Foot Exam-02/17/18 Mammogram-NA Ophthalmology-02/25/15 Dexa Scan - NA Annual Well Visit - NA Micro albumin-NA Hemoglobin A1c- 04/24/21  Star Rating Drugs: Atorvastatin 20 mg-last fill 01/03/21 30 ds Olmesartan 20 mg-last  fill 12/15/20 30ds  Velvet Bathe Clinical Pharmacist Assistant [phone]

## 2021-10-26 ENCOUNTER — Ambulatory Visit: Payer: Medicare Other

## 2021-10-30 ENCOUNTER — Other Ambulatory Visit: Payer: Self-pay

## 2021-10-30 ENCOUNTER — Ambulatory Visit (INDEPENDENT_AMBULATORY_CARE_PROVIDER_SITE_OTHER): Payer: Medicare Other

## 2021-10-30 DIAGNOSIS — M810 Age-related osteoporosis without current pathological fracture: Secondary | ICD-10-CM

## 2021-10-30 MED ORDER — DENOSUMAB 60 MG/ML ~~LOC~~ SOSY
60.0000 mg | PREFILLED_SYRINGE | Freq: Once | SUBCUTANEOUS | Status: AC
  Administered 2021-10-30: 60 mg via SUBCUTANEOUS

## 2021-10-30 NOTE — Progress Notes (Signed)
After obtaining consent, and per orders of Dr. Lawerance Bach, injection of prolia given by Lum Babe in right arm. Patient tolerated injection well and was instructed to report any adverse reaction to me immediately. ? ?

## 2022-01-05 ENCOUNTER — Telehealth: Payer: Medicare Other

## 2022-05-01 ENCOUNTER — Ambulatory Visit: Payer: Medicare Other | Admitting: Internal Medicine

## 2022-05-02 ENCOUNTER — Ambulatory Visit (INDEPENDENT_AMBULATORY_CARE_PROVIDER_SITE_OTHER): Payer: Medicare Other

## 2022-05-02 DIAGNOSIS — M810 Age-related osteoporosis without current pathological fracture: Secondary | ICD-10-CM | POA: Diagnosis not present

## 2022-05-02 MED ORDER — DENOSUMAB 60 MG/ML ~~LOC~~ SOSY
60.0000 mg | PREFILLED_SYRINGE | Freq: Once | SUBCUTANEOUS | Status: AC
  Administered 2022-05-02: 60 mg via SUBCUTANEOUS

## 2022-05-02 NOTE — Progress Notes (Signed)
After obtaining consent, and per orders of Dr. Lawerance Bach, injection of Prolia given on the right arm subcut by Ferdie Ping. Patient instructed to report any adverse reaction to me immediately.

## 2022-05-03 ENCOUNTER — Telehealth: Payer: Self-pay

## 2022-05-03 NOTE — Telephone Encounter (Signed)
Left message for patients daughter that paperwork was complete.  If she provides fax number please let me know so I can send paperwork.

## 2023-02-09 IMAGING — CT CT T SPINE W/O CM
3 series · 10 of 33 positions shown, 12 images · non-contrast
Comparison: None.

CLINICAL DATA: Mid back pain

EXAM:
CT THORACIC SPINE WITHOUT CONTRAST
TECHNIQUE: Multidetector CT images of the thoracic were obtained using the
standard protocol without intravenous contrast.

[Series 3: t spine st · axial · 0.33mm/px · z∈[-132,-2]mm · 2 of 142 slices shown, 3 images]
[im 44/142  soft-tissue]
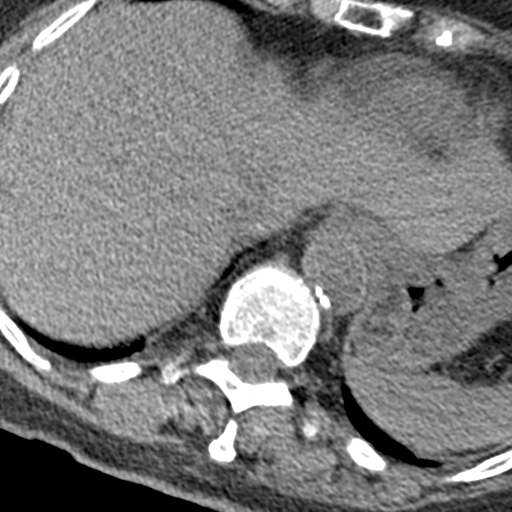
[im 44/142  bone]
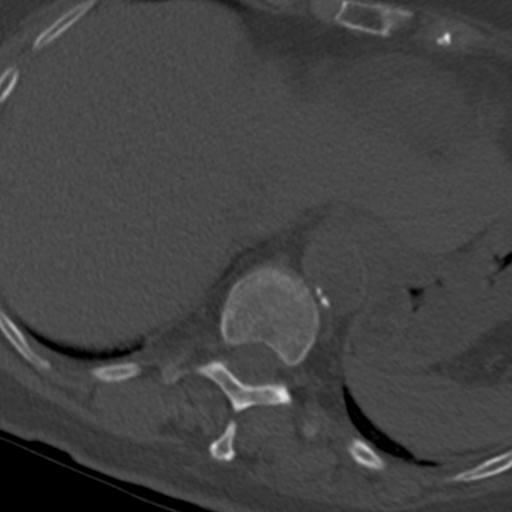
[im 109/142  bone]
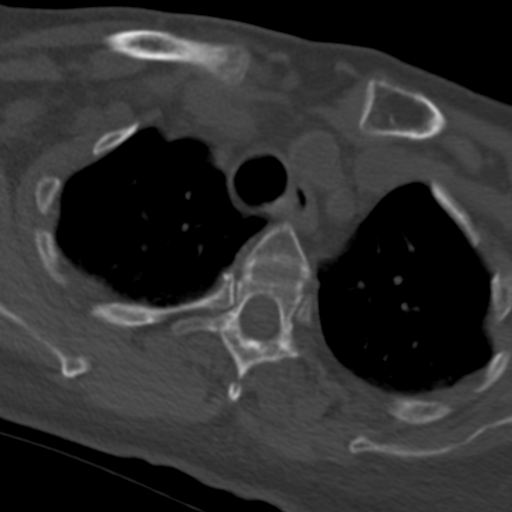

[Series 5: coronal bone · coronal · 0.26mm/px · 3 of 70 slices shown]
[im 14/70  bone]
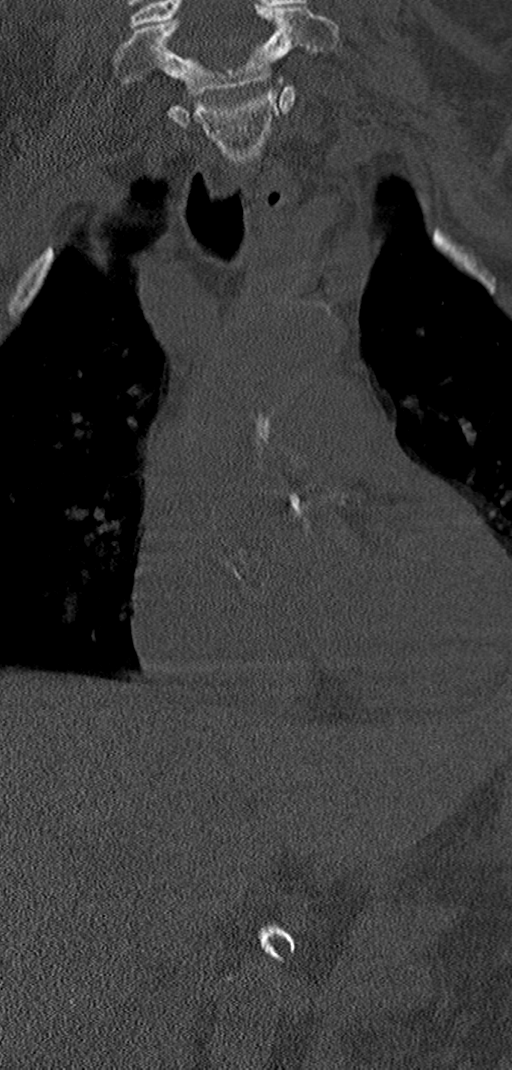
[im 28/70  bone]
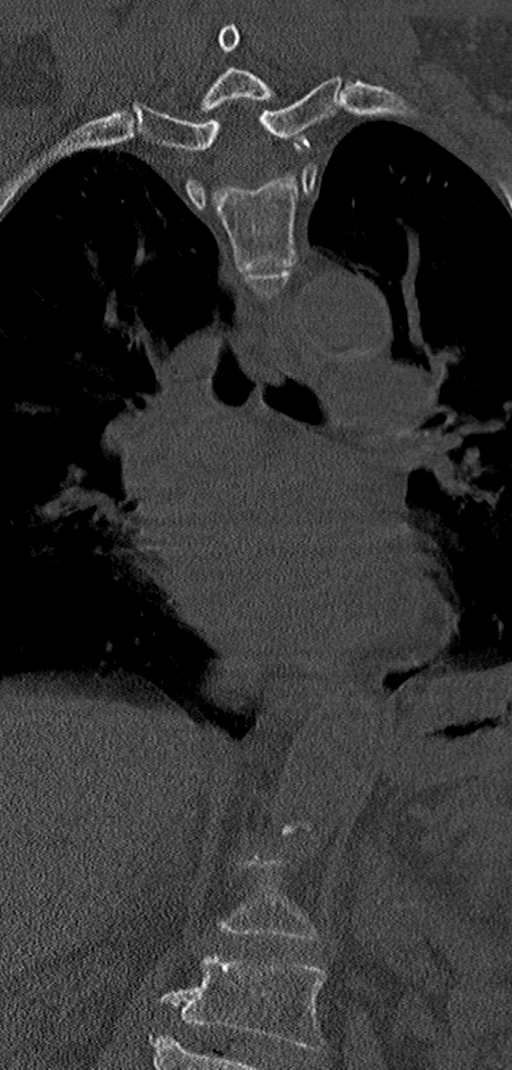
[im 42/70  bone]
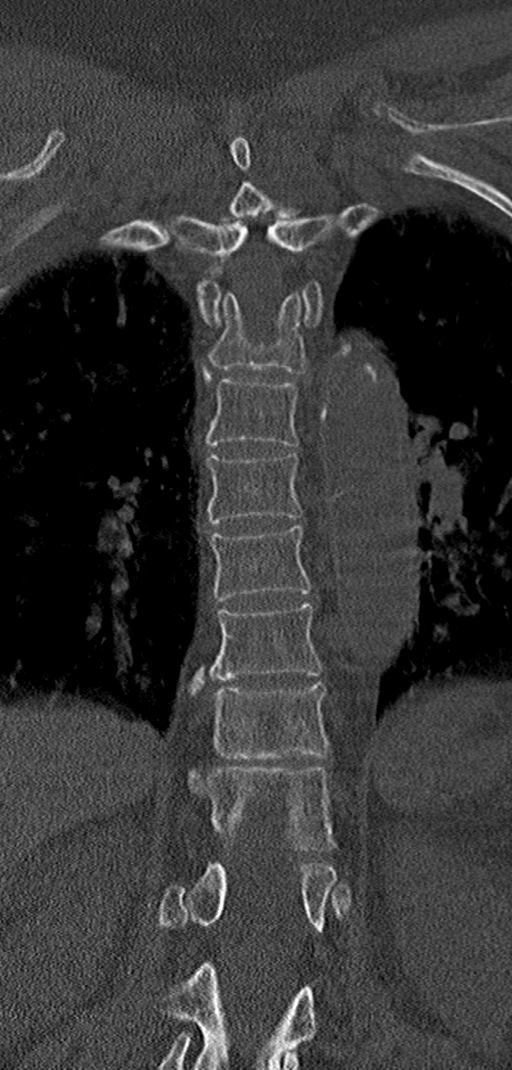

[Series 6: sagittal bone · sagittal · 0.31mm/px · 5 of 56 slices shown, 6 images]
[im 19/56  bone]
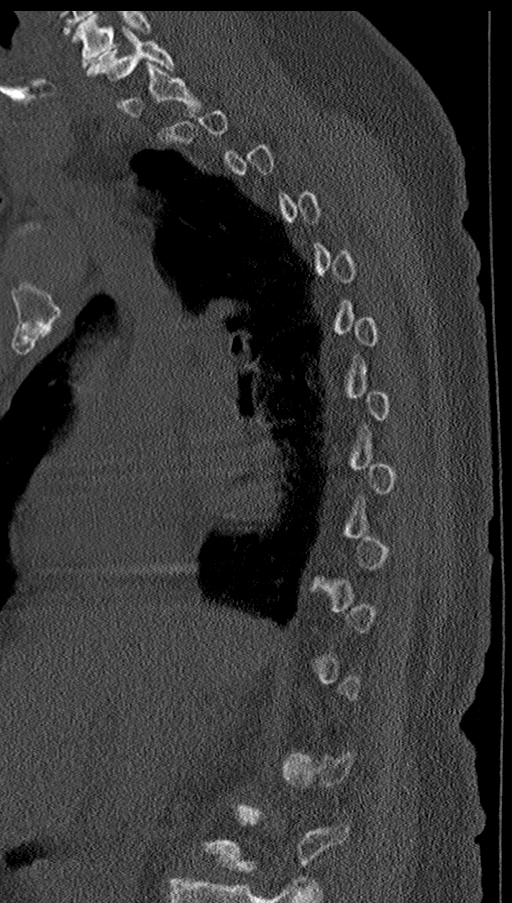
[im 23/56  bone]
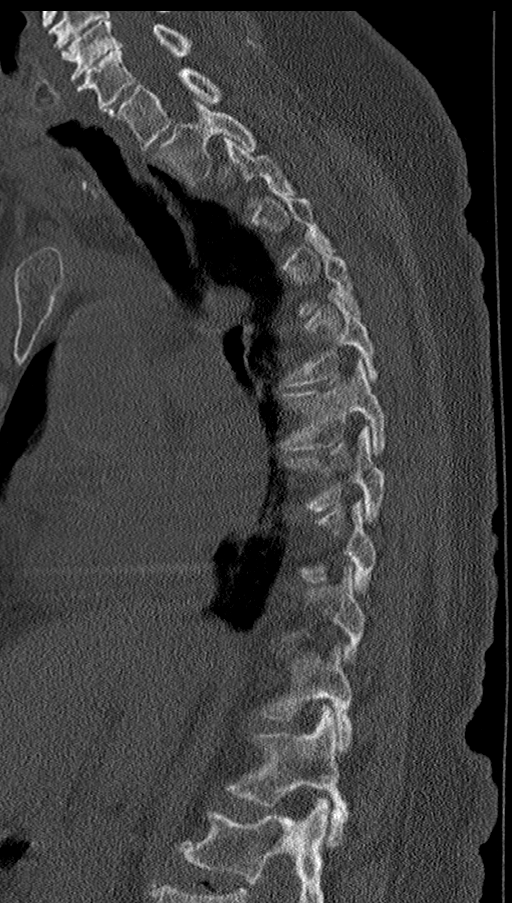
[im 28/56  soft-tissue]
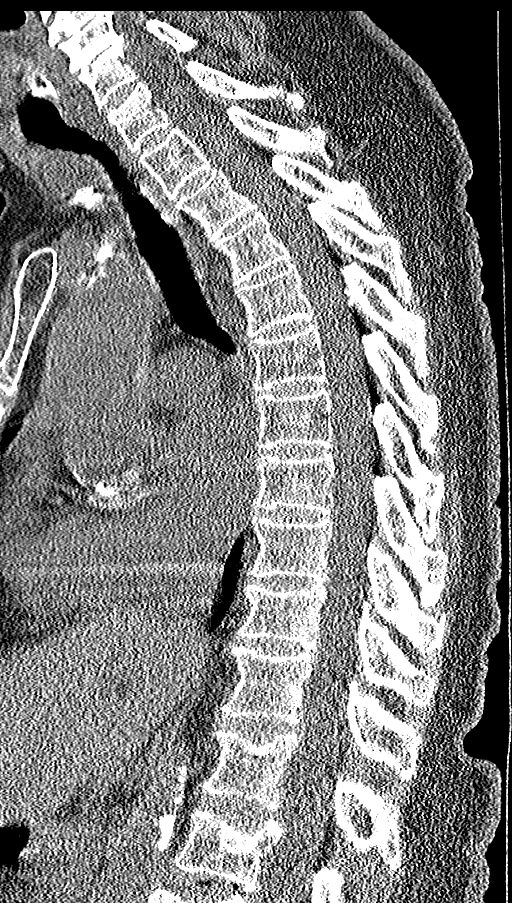
[im 28/56  bone]
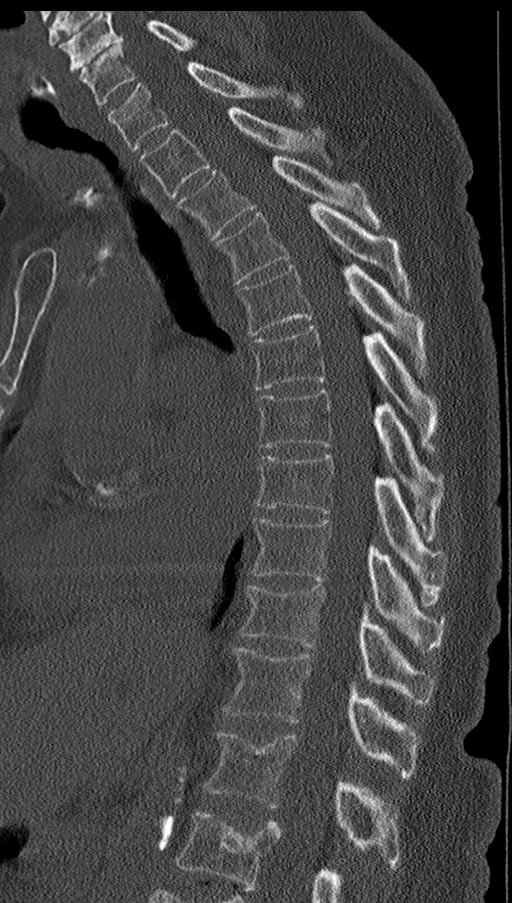
[im 33/56  bone]
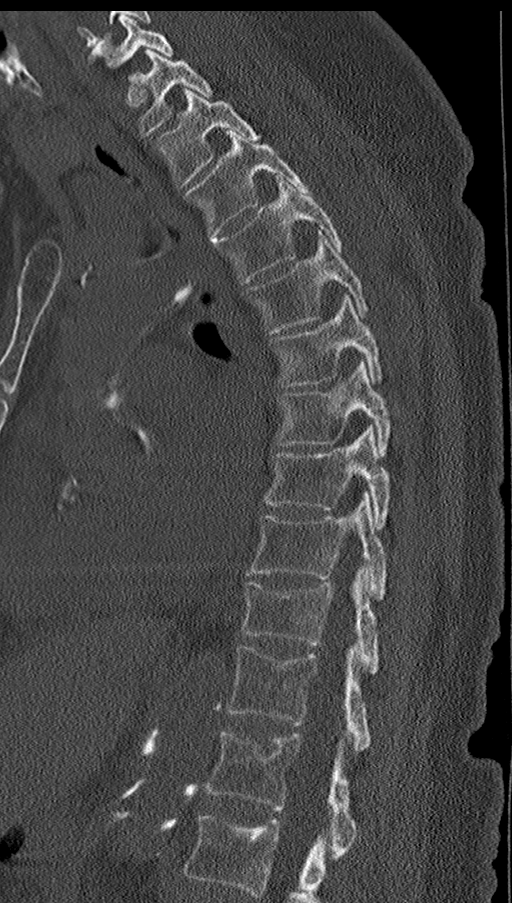
[im 37/56  bone]
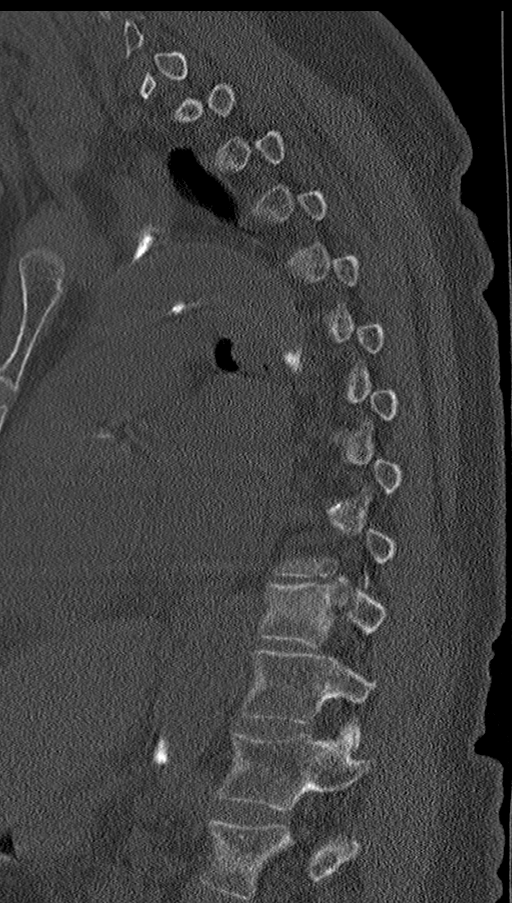

[10 of 33 positions shown; findings below may reference images not displayed]

FINDINGS: Alignment: Normal.

Vertebrae: Large Schmorl's nodes at T12 and L1.  No acute fracture.

Paraspinal and other soft tissues: Calcific aortic atherosclerosis.

Disc levels: No spinal canal stenosis.
IMPRESSION: 1. No acute fracture or static subluxation of the thoracic spine.
2. Large Schmorl's nodes at T12 and L1.

Aortic Atherosclerosis ([id number].M).

## 2023-03-12 ENCOUNTER — Telehealth: Payer: Self-pay | Admitting: Internal Medicine

## 2023-03-29 ENCOUNTER — Telehealth: Payer: Self-pay | Admitting: Internal Medicine

## 2023-03-29 ENCOUNTER — Telehealth: Payer: Self-pay

## 2023-03-29 ENCOUNTER — Other Ambulatory Visit (HOSPITAL_COMMUNITY): Payer: Self-pay

## 2023-03-29 NOTE — Telephone Encounter (Signed)
Prolia VOB initiated via [email or web address]  Last Prolia inj: 05/02/22 Next Prolia inj DUE:  now

## 2023-03-29 NOTE — Telephone Encounter (Signed)
Patient wants to know when it is time for her Prolia shot and if she has been approved?  Please call [phone]

## 2023-03-29 NOTE — Telephone Encounter (Signed)
Pharmacy Patient Advocate Encounter  Received notification from  [employer]  that Prior Authorization for Prolia has been APPROVED for Medical buy and bill  PA #/Case ID/Reference #: [id number]   Per test claim, $0 copay if filled at [facility].

## 2023-04-02 NOTE — Telephone Encounter (Signed)
Pt ready for scheduling for Prolia on or after : 04/02/23  Out-of-pocket cost due at time of visit: $302  Primary: [employer] - Medicare Prolia co-insurance: 20% Admin fee co-insurance: 20%  Secondary: n/a Prolia co-insurance:  Admin fee co-insurance:   Medical Benefit Details: Date Benefits were checked: 04/01/23 Deductible: no/ Coinsurance: 20%/ Admin Fee: 20%  Prior Auth: approved PA# [id number] Expiration Date: 03/29/23 to 03/28/24  # of doses approved: 2  Pharmacy benefit: Copay $0 If patient wants fill through the pharmacy benefit please send prescription to:  [facility] , and include estimated need by date in rx notes. Pharmacy will ship medication directly to the office.  Patient not eligible for Prolia Copay Card. Copay Card can make patient's cost as little as $25. Link to apply: [email or web address]://[email or web address]  ** This summary of benefits is an estimation of the patient's out-of-pocket cost. Exact cost may very based on individual plan coverage.

## 2023-04-03 NOTE — Telephone Encounter (Signed)
Pt has appt for nurse visit 04/05/23.Marland KitchenRaechel Andrews

## 2023-04-05 ENCOUNTER — Other Ambulatory Visit: Payer: Self-pay

## 2023-04-05 ENCOUNTER — Ambulatory Visit: Payer: Medicare Other

## 2023-04-05 ENCOUNTER — Other Ambulatory Visit (HOSPITAL_COMMUNITY): Payer: Self-pay

## 2023-04-05 DIAGNOSIS — M810 Age-related osteoporosis without current pathological fracture: Secondary | ICD-10-CM

## 2023-04-05 MED ORDER — DENOSUMAB 60 MG/ML ~~LOC~~ SOSY
60.0000 mg | PREFILLED_SYRINGE | Freq: Once | SUBCUTANEOUS | 0 refills | Status: DC
  Filled 2023-04-05: qty 1, 1d supply, fill #0
  Filled 2023-04-08: qty 1, 180d supply, fill #0

## 2023-04-05 NOTE — Telephone Encounter (Signed)
Script faxed to [facility] today.  Will notify patient once delivered so we can get her rescheduled.

## 2023-04-08 ENCOUNTER — Other Ambulatory Visit: Payer: Self-pay

## 2023-04-08 ENCOUNTER — Other Ambulatory Visit (HOSPITAL_COMMUNITY): Payer: Self-pay

## 2023-04-11 ENCOUNTER — Ambulatory Visit (INDEPENDENT_AMBULATORY_CARE_PROVIDER_SITE_OTHER): Payer: Medicare Other

## 2023-04-11 DIAGNOSIS — M810 Age-related osteoporosis without current pathological fracture: Secondary | ICD-10-CM

## 2023-04-11 MED ORDER — DENOSUMAB 60 MG/ML ~~LOC~~ SOSY
60.0000 mg | PREFILLED_SYRINGE | Freq: Once | SUBCUTANEOUS | Status: AC
  Administered 2023-04-11: 60 mg via SUBCUTANEOUS

## 2023-04-11 NOTE — Progress Notes (Addendum)
MD is currently out of office please sign. Pt received her prolia today and responded well.  Medical screening examination/treatment/procedure(s) were performed by non-physician practitioner and as supervising physician I was immediately available for consultation/collaboration.  I agree with above. Jacinta Shoe, MD

## 2023-04-30 ENCOUNTER — Encounter: Payer: Self-pay | Admitting: Internal Medicine

## 2023-04-30 NOTE — Progress Notes (Signed)
Subjective:    Patient ID: Patricia Andrews, female    DOB: 09/16/1925, 89 y.o.   MRN: [id number]     HPI Patricia Andrews is here for follow up of her chronic medical problems.  She is here with her daughter [name]  She is following with the doctor at [facility].    OP -getting Prolia shots every 6 months-up-to-date with injections  Having right knee pain.   - pain severe 10/10 - started suddenly.  Has been having right knee pain in the past - but never this bad.     ? Ear wax-has decreased hearing and a sensation of fullness in her ears.  Medications and allergies reviewed with patient and updated if appropriate.  Current Outpatient Medications on File Prior to Visit  Medication Sig Dispense Refill   acetaminophen (TYLENOL) 500 MG tablet Take 1 tablet (500 mg total) by mouth every 6 (six) hours as needed for mild pain. 90 tablet 5   atorvastatin (LIPITOR) 20 MG tablet TAKE 1 TABLET BY MOUTH  DAILY 90 tablet 3   bisacodyl (DULCOLAX) 10 MG suppository Place 10 mg rectally as needed for moderate constipation.     citalopram (CELEXA) 20 MG tablet TAKE 1 TABLET BY MOUTH  DAILY 90 tablet 1   cromolyn (OPTICROM) 4 % ophthalmic solution Place 1 drop into both eyes 2 (two) times daily.     denosumab (PROLIA) 60 MG/ML SOLN injection Inject 60 mg into the skin every 6 (six) months. Administer in upper arm, thigh, or abdomen  Usually take on the 23rd of each month     diltiazem (CARDIZEM CD) 300 MG 24 hr capsule TAKE 1 CAPSULE BY MOUTH  DAILY 90 capsule 1   divalproex (DEPAKOTE) 250 MG DR tablet Take 250 mg by mouth every evening.     docusate sodium (COLACE) 100 MG capsule Take 100 mg by mouth 2 (two) times daily.     donepezil (ARICEPT) 10 MG tablet TAKE 1 TABLET BY MOUTH AT  BEDTIME 90 tablet 1   Glucerna (GLUCERNA) LIQD Take 237 mLs by mouth 2 (two) times daily.     glucose blood (ONETOUCH VERIO) test strip CHECK BLOOD SUGAR TWO TIMES DAILY AS DIRECTED.  E11.9 (Patient taking differently:  CHECK BLOOD SUGAR TWO TIMES DAILY AS DIRECTED.  E11.9) 200 strip 3   Insulin Pen Needle 32G X 4 MM MISC Use to administer insulin four times a day Dx E11.9 30 each 0   Lancets (ONETOUCH DELICA PLUS LANCET33G) MISC USE WITH LANCING DEVICE TO  CHECK BLOOD SUGAR TWO TIMES DAILY AS DIRECTED. E11.9 (Patient taking differently: USE WITH LANCING DEVICE TO  CHECK BLOOD SUGAR TWO TIMES DAILY AS DIRECTED. E11.9) 200 each 3   LANTUS SOLOSTAR 100 UNIT/ML Solostar Pen INJECT SUBCUTANEOUSLY 10  UNITS DAILY 15 mL 1   magnesium hydroxide (MILK OF MAGNESIA) 400 MG/5ML suspension Take by mouth daily as needed for mild constipation.     memantine (NAMENDA) 5 MG tablet TAKE 1 TABLET BY MOUTH  TWICE DAILY 180 tablet 3   mirtazapine (REMERON) 30 MG tablet Take 1 tablet (30 mg total) by mouth at bedtime. 90 tablet 1   Multiple Vitamin (MULTIVITAMIN) capsule Take 1 capsule by mouth daily. 90 capsule 3   olmesartan (BENICAR) 20 MG tablet TAKE 1 TABLET BY MOUTH  DAILY 90 tablet 3   Omega-3 Fatty Acids (FISH OIL) 1000 MG CAPS Take 1 capsule by mouth every morning. 90 capsule 3   polyethylene  glycol (MIRALAX / GLYCOLAX) 17 g packet Take 17 g by mouth daily as needed.     vitamin B-12 (CYANOCOBALAMIN) 100 MCG tablet Take 100 mcg by mouth daily.     Zolpidem Tartrate (AMBIEN PO) Take 2.5 mg by mouth at bedtime.     azithromycin (AZASITE) 1 % ophthalmic solution      insulin lispro (HUMALOG) 100 UNIT/ML KwikPen      No current facility-administered medications on file prior to visit.     Review of Systems  Constitutional:  Negative for appetite change and fever.  HENT:  Positive for hearing loss, postnasal drip and voice change.   Respiratory:  Positive for cough (clearing throat). Negative for shortness of breath and wheezing.   Cardiovascular:  Positive for leg swelling. Negative for chest pain and palpitations.  Gastrointestinal:        No gerd  Musculoskeletal:  Positive for arthralgias.  Neurological:  Negative  for dizziness and headaches.  Psychiatric/Behavioral:  Negative for sleep disturbance.        Objective:   Vitals:   05/01/23 1544  BP: 120/68  Pulse: 62  Temp: 98.5 F (36.9 C)  SpO2: 99%   BP Readings from Last 3 Encounters:  05/01/23 120/68  04/24/21 140/72  11/23/20 138/64   Wt Readings from Last 3 Encounters:  05/01/23 126 lb (57.2 kg)  11/23/20 117 lb 12.8 oz (53.4 kg)  10/21/20 118 lb 3.2 oz (53.6 kg)   Body mass index is 23.81 kg/m.    Physical Exam Constitutional:      General: She is not in acute distress.    Appearance: Normal appearance.  HENT:     Head: Normocephalic and atraumatic.     Right Ear: External ear normal. There is impacted cerumen.     Left Ear: External ear normal. There is impacted cerumen.  Eyes:     Conjunctiva/sclera: Conjunctivae normal.  Cardiovascular:     Rate and Rhythm: Normal rate and regular rhythm.     Heart sounds: Normal heart sounds.  Pulmonary:     Effort: Pulmonary effort is normal. No respiratory distress.     Breath sounds: Normal breath sounds. No wheezing.  Musculoskeletal:     Cervical back: Neck supple.     Right lower leg: No edema.     Left lower leg: No edema.  Lymphadenopathy:     Cervical: No cervical adenopathy.  Skin:    General: Skin is warm and dry.     Findings: No rash.  Neurological:     Mental Status: She is alert. Mental status is at baseline.  Psychiatric:        Mood and Affect: Mood normal.        Behavior: Behavior normal.        Lab Results  Component Value Date   WBC 6.2 04/24/2021   HGB 9.7 (L) 04/24/2021   HCT 30.1 (L) 04/24/2021   PLT 152.0 04/24/2021   GLUCOSE 153 (H) 04/24/2021   CHOL 166 04/24/2021   TRIG 76.0 04/24/2021   HDL 83.10 04/24/2021   LDLDIRECT 108.6 04/21/2013   LDLCALC 68 04/24/2021   ALT 15 04/24/2021   AST 20 04/24/2021   NA 137 04/24/2021   K 4.8 04/24/2021   CL 100 04/24/2021   CREATININE 1.65 (H) 04/24/2021   BUN 34 (H) 04/24/2021   CO2 28  04/24/2021   TSH 2.42 06/09/2019   INR 1.05 07/17/2016   HGBA1C 7.3 (H) 04/24/2021   MICROALBUR 3.1 (H)  04/13/2015     Assessment & Plan:    See Problem List for Assessment and Plan of chronic medical problems.

## 2023-05-01 ENCOUNTER — Ambulatory Visit: Payer: Medicare Other | Admitting: Internal Medicine

## 2023-05-01 VITALS — BP 120/68 | HR 62 | Temp 98.5°F | Ht 61.0 in | Wt 126.0 lb

## 2023-05-01 DIAGNOSIS — E119 Type 2 diabetes mellitus without complications: Secondary | ICD-10-CM

## 2023-05-01 DIAGNOSIS — M810 Age-related osteoporosis without current pathological fracture: Secondary | ICD-10-CM | POA: Diagnosis not present

## 2023-05-01 DIAGNOSIS — M25561 Pain in right knee: Secondary | ICD-10-CM

## 2023-05-01 DIAGNOSIS — Z794 Long term (current) use of insulin: Secondary | ICD-10-CM

## 2023-05-01 DIAGNOSIS — H6123 Impacted cerumen, bilateral: Secondary | ICD-10-CM | POA: Diagnosis not present

## 2023-05-01 LAB — POCT GLYCOSYLATED HEMOGLOBIN (HGB A1C)
HbA1c POC (<> result, manual entry): 7.7 % (ref 4.0–5.6)
HbA1c, POC (controlled diabetic range): 7.7 % — AB (ref 0.0–7.0)
HbA1c, POC (prediabetic range): 7.7 % — AB (ref 5.7–6.4)
Hemoglobin A1C: 7.7 % — AB (ref 4.0–5.6)

## 2023-05-01 NOTE — Assessment & Plan Note (Signed)
Acute on chronic Per daughter she has not had right knee pain for a while-but much worse now Possible flare of arthritis Gout is always a possibility Given diabetes could consider steroid injection by sports medicine-given number for them to call make an appointment if she agrees-she is concerned about it elevating her sugars

## 2023-05-01 NOTE — Assessment & Plan Note (Signed)
Chronic On insulin - management per [facility] Lab Results  Component Value Date   HGBA1C 7.7 (A) 05/01/2023   HGBA1C 7.7 05/01/2023   HGBA1C 7.7 (A) 05/01/2023   HGBA1C 7.7 (A) 05/01/2023   Controlled for her

## 2023-05-01 NOTE — Assessment & Plan Note (Signed)
Acute  B/l cerumen impaction  Will clean our ears today

## 2023-05-01 NOTE — Assessment & Plan Note (Addendum)
Chronic Continue calcium and vitamin d Continue prolia Q 6 months Blood work done at [facility] I will see her once a year

## 2023-05-01 NOTE — Patient Instructions (Addendum)
    [facility] - [id number] - if knee injection is needed   Your A1c was checked today.  Your A1c is 7.7%   Your ears were cleaned out   Continue prolia injection every 6 months     Return in about 1 year (around 04/30/2024) for follow up.

## 2023-05-18 ENCOUNTER — Encounter (HOSPITAL_COMMUNITY): Payer: Self-pay

## 2023-09-19 ENCOUNTER — Other Ambulatory Visit: Payer: Self-pay | Admitting: Internal Medicine

## 2023-09-19 ENCOUNTER — Other Ambulatory Visit: Payer: Self-pay

## 2023-09-19 DIAGNOSIS — M810 Age-related osteoporosis without current pathological fracture: Secondary | ICD-10-CM

## 2023-09-19 NOTE — Progress Notes (Signed)
Specialty Pharmacy Refill Coordination Note  Patricia Andrews is a 89 y.o. female contacted today regarding refills of specialty medication(s) Denosumab (PROLIA) Spoke with patient's daughter  Patient requested Courier to Provider Office   Delivery date: 10/02/23   Verified address: [facility]- [address]   Medication will be filled on 02.04.25.   Refill pending.

## 2023-09-20 ENCOUNTER — Other Ambulatory Visit: Payer: Self-pay

## 2023-09-20 MED ORDER — PROLIA 60 MG/ML ~~LOC~~ SOSY
60.0000 mg | PREFILLED_SYRINGE | Freq: Once | SUBCUTANEOUS | 0 refills | Status: DC
  Filled 2023-09-20: qty 1, 180d supply, fill #0

## 2023-09-24 ENCOUNTER — Other Ambulatory Visit: Payer: Self-pay

## 2023-10-02 ENCOUNTER — Other Ambulatory Visit: Payer: Self-pay

## 2023-10-04 ENCOUNTER — Telehealth: Payer: Self-pay

## 2023-10-04 ENCOUNTER — Other Ambulatory Visit (HOSPITAL_COMMUNITY): Payer: Self-pay

## 2023-10-04 NOTE — Telephone Encounter (Signed)
 Prolia VOB initiated via [email or web address]  Next Prolia inj DUE: 10/12/23

## 2023-10-10 ENCOUNTER — Other Ambulatory Visit (HOSPITAL_COMMUNITY): Payer: Self-pay

## 2023-10-10 NOTE — Telephone Encounter (Signed)
Pharmacy Patient Advocate Encounter  Received notification from  [employer]  that Prior Authorization for Prolia has been APPROVED from 10/10/23 to 10/09/24   PA #/Case ID/Reference #: [id number]

## 2023-10-10 NOTE — Telephone Encounter (Signed)
Pt ready for scheduling for Prolia on or after : 10/12/23  Out-of-pocket cost due at time of visit: $0  Number of injection/visits approved: 2  Primary: [employer] - Medicare Prolia co-insurance: 100% Admin fee co-insurance: 100%  Secondary: N/A Prolia co-insurance:  Admin fee co-insurance:   Medical Benefit Details: Date Benefits were checked: 10/07/23 Deductible: no/ Coinsurance: 100%/ Admin Fee: 100%  Prior Auth: Approved PA# [id number] Expiration Date: 10/10/23 to 10/09/24  # of doses approved: 2  Pharmacy benefit: Copay $0 If patient wants fill through the pharmacy benefit please send prescription to:  [facility] , and include estimated need by date in rx notes. Pharmacy will ship medication directly to the office.  Patient not eligible for Prolia Copay Card. Copay Card can make patient's cost as little as $25. Link to apply: [email or web address]  ** This summary of benefits is an estimation of the patient's out-of-pocket cost. Exact cost may very based on individual plan coverage.

## 2023-10-10 NOTE — Telephone Encounter (Signed)
Pharmacy Patient Advocate Encounter   Received notification from  [employer] Portal that prior authorization for Prolia is required/requested.   Insurance verification completed.   The patient is insured through  [employer]  .   Per test claim: PA required; PA submitted to above mentioned insurance via [facility] portal Key/confirmation #/EOC [id number] Status is pending

## 2023-10-10 NOTE — Telephone Encounter (Signed)
Marland Kitchen

## 2023-10-14 NOTE — Telephone Encounter (Signed)
Called and left message for [name], patient's daughter.  Prolia has arrived in the office from pharmacy.  She just needs to be scheduled if they call back.

## 2023-10-30 NOTE — Telephone Encounter (Signed)
 Being handled in separate note.

## 2023-11-12 ENCOUNTER — Other Ambulatory Visit: Payer: Self-pay

## 2023-11-12 DIAGNOSIS — M810 Age-related osteoporosis without current pathological fracture: Secondary | ICD-10-CM

## 2023-11-12 MED ORDER — DENOSUMAB 60 MG/ML ~~LOC~~ SOSY
60.0000 mg | PREFILLED_SYRINGE | Freq: Once | SUBCUTANEOUS | Status: AC
  Administered 2023-11-21: 60 mg via SUBCUTANEOUS

## 2023-11-19 ENCOUNTER — Telehealth: Payer: Self-pay | Admitting: Internal Medicine

## 2023-11-19 NOTE — Telephone Encounter (Unsigned)
 Copied from CRM [id number]. Topic: General - Other >> Nov 19, 2023  1:18 PM [name] wrote: Reason for CRM: Patients daughter wants to discuss lab results from letter received at [facility] appointment tomorrow.  [name] [phone]

## 2023-11-20 NOTE — Telephone Encounter (Addendum)
 Left voice mail for pt daughter to give [place] a call back in regards to labs, pt will be in clinic tomorrow

## 2023-11-21 ENCOUNTER — Ambulatory Visit

## 2023-11-21 DIAGNOSIS — M810 Age-related osteoporosis without current pathological fracture: Secondary | ICD-10-CM | POA: Diagnosis not present

## 2023-11-21 MED ORDER — DENOSUMAB 60 MG/ML ~~LOC~~ SOSY
60.0000 mg | PREFILLED_SYRINGE | Freq: Once | SUBCUTANEOUS | Status: AC
  Administered 2024-06-15: 60 mg via SUBCUTANEOUS

## 2023-11-21 NOTE — Progress Notes (Signed)
 Pt visit today for Prolia injection per Dr. Lawerance Bach. Pt tolerated injection well, schedule 6 months appt for next injection

## 2023-12-27 NOTE — Telephone Encounter (Signed)
 Taken care of

## 2024-03-20 ENCOUNTER — Other Ambulatory Visit: Payer: Self-pay | Admitting: Internal Medicine

## 2024-03-20 ENCOUNTER — Other Ambulatory Visit: Payer: Self-pay

## 2024-03-20 DIAGNOSIS — M810 Age-related osteoporosis without current pathological fracture: Secondary | ICD-10-CM

## 2024-03-20 MED ORDER — PROLIA 60 MG/ML ~~LOC~~ SOSY
60.0000 mg | PREFILLED_SYRINGE | Freq: Once | SUBCUTANEOUS | 0 refills | Status: AC
  Filled 2024-03-20: qty 1, 1d supply, fill #0
  Filled 2024-04-20: qty 1, 180d supply, fill #0

## 2024-04-20 ENCOUNTER — Other Ambulatory Visit: Payer: Self-pay

## 2024-04-20 ENCOUNTER — Other Ambulatory Visit (HOSPITAL_COMMUNITY): Payer: Self-pay

## 2024-04-20 NOTE — Progress Notes (Signed)
 Specialty Pharmacy Refill Coordination Note  Patricia Andrews is a 89 y.o. female contacted today regarding refills of specialty medication(s) Denosumab  (Prolia )  Spoke with patient's daughter  Patient requested Courier to Provider Office   Delivery date: 04/28/24   Verified address: [facility]- [address]   Medication will be filled on 04/24/24.    Next injection date: 05/01/24

## 2024-04-22 ENCOUNTER — Other Ambulatory Visit (HOSPITAL_COMMUNITY): Payer: Self-pay

## 2024-04-22 ENCOUNTER — Telehealth: Payer: Self-pay

## 2024-04-22 NOTE — Telephone Encounter (Signed)
 SABRA

## 2024-04-22 NOTE — Telephone Encounter (Signed)
 Prolia  VOB initiated via [email or web address]  Next Prolia  inj DUE: 05/23/24

## 2024-04-22 NOTE — Telephone Encounter (Signed)
 Pt ready for scheduling for PROLIA  on or after : 05/23/24  Option# 1: Buy/Bill (Office supplied medication)  Out-of-pocket cost due at time of clinic visit: $357  Number of injection/visits approved: 2  Primary: [employer] Prolia  co-insurance: 20% Admin fee co-insurance: 20%  Secondary: --- Prolia  co-insurance:  Admin fee co-insurance:   Medical Benefit Details: Date Benefits were checked: 04/22/24 Deductible: NO/ Coinsurance: 20%/ Admin Fee: 20%  Prior Auth: APPROVED PA# [id number] Expiration Date: 10/10/23-10/09/24  # of doses approved: 2 ----------------------------------------------------------------------- Option# 2- Med Obtained from pharmacy:  Pharmacy benefit: Copay $0 (Paid to pharmacy) Admin Fee: 20% (Pay at clinic)  Prior Auth: N/A PA# Expiration Date:   # of doses approved:   If patient wants fill through the pharmacy benefit please send prescription to: [facility], and include estimated need by date in rx notes. Pharmacy will ship medication directly to the office.  Patient NOT eligible for Prolia  Copay Card. Copay Card can make patient's cost as little as $25. Link to apply: [email or web address]  ** This summary of benefits is an estimation of the patient's out-of-pocket cost. Exact cost may very based on individual plan coverage.

## 2024-04-23 ENCOUNTER — Other Ambulatory Visit: Payer: Self-pay

## 2024-04-30 ENCOUNTER — Encounter: Payer: Self-pay | Admitting: Internal Medicine

## 2024-05-01 ENCOUNTER — Ambulatory Visit: Payer: 59 | Admitting: Internal Medicine

## 2024-05-01 DIAGNOSIS — M810 Age-related osteoporosis without current pathological fracture: Secondary | ICD-10-CM

## 2024-06-01 ENCOUNTER — Encounter: Payer: Self-pay | Admitting: Internal Medicine

## 2024-06-01 NOTE — Progress Notes (Deleted)
 Subjective:    Patient ID: Patricia Andrews, female    DOB: Sep 03, 1925, 89 y.o.   MRN: [id number]     HPI Patricia Andrews is here for follow up of her chronic medical problems.  She is here with her daughter [name]  She is following with the doctor at [facility].    OP -getting Prolia  shots every 6 months-up-to-date with injections  Having right knee pain.   - pain severe 10/10 - started suddenly.  Has been having right knee pain in the past - but never this bad.     ? Ear wax-has decreased hearing and a sensation of fullness in her ears.    Medications and allergies reviewed with patient and updated if appropriate.  Current Outpatient Medications on File Prior to Visit  Medication Sig Dispense Refill   acetaminophen  (TYLENOL ) 500 MG tablet Take 1 tablet (500 mg total) by mouth every 6 (six) hours as needed for mild pain. 90 tablet 5   atorvastatin  (LIPITOR) 20 MG tablet TAKE 1 TABLET BY MOUTH  DAILY 90 tablet 3   azithromycin (AZASITE) 1 % ophthalmic solution      bisacodyl  (DULCOLAX) 10 MG suppository Place 10 mg rectally as needed for moderate constipation.     citalopram  (CELEXA ) 20 MG tablet TAKE 1 TABLET BY MOUTH  DAILY 90 tablet 1   cromolyn (OPTICROM) 4 % ophthalmic solution Place 1 drop into both eyes 2 (two) times daily.     denosumab  (PROLIA ) 60 MG/ML SOLN injection Inject 60 mg into the skin every 6 (six) months. Administer in upper arm, thigh, or abdomen  Usually take on the 23rd of each month     diltiazem  (CARDIZEM  CD) 300 MG 24 hr capsule TAKE 1 CAPSULE BY MOUTH  DAILY 90 capsule 1   divalproex  (DEPAKOTE ) 250 MG DR tablet Take 250 mg by mouth every evening.     docusate sodium (COLACE) 100 MG capsule Take 100 mg by mouth 2 (two) times daily.     donepezil  (ARICEPT ) 10 MG tablet TAKE 1 TABLET BY MOUTH AT  BEDTIME 90 tablet 1   Glucerna (GLUCERNA) LIQD Take 237 mLs by mouth 2 (two) times daily.     glucose blood (ONETOUCH VERIO) test strip CHECK BLOOD SUGAR TWO TIMES  DAILY AS DIRECTED.  E11.9 (Patient taking differently: CHECK BLOOD SUGAR TWO TIMES DAILY AS DIRECTED.  E11.9) 200 strip 3   insulin  lispro (HUMALOG) 100 UNIT/ML KwikPen      Insulin  Pen Needle 32G X 4 MM MISC Use to administer insulin  four times a day Dx E11.9 30 each 0   Lancets (ONETOUCH DELICA PLUS LANCET33G) MISC USE WITH LANCING DEVICE TO  CHECK BLOOD SUGAR TWO TIMES DAILY AS DIRECTED. E11.9 (Patient taking differently: USE WITH LANCING DEVICE TO  CHECK BLOOD SUGAR TWO TIMES DAILY AS DIRECTED. E11.9) 200 each 3   LANTUS  SOLOSTAR 100 UNIT/ML Solostar Pen INJECT SUBCUTANEOUSLY 10  UNITS DAILY 15 mL 1   magnesium hydroxide (MILK OF MAGNESIA) 400 MG/5ML suspension Take by mouth daily as needed for mild constipation.     memantine  (NAMENDA ) 5 MG tablet TAKE 1 TABLET BY MOUTH  TWICE DAILY 180 tablet 3   mirtazapine  (REMERON ) 30 MG tablet Take 1 tablet (30 mg total) by mouth at bedtime. 90 tablet 1   Multiple Vitamin (MULTIVITAMIN) capsule Take 1 capsule by mouth daily. 90 capsule 3   olmesartan  (BENICAR ) 20 MG tablet TAKE 1 TABLET BY MOUTH  DAILY 90 tablet  3   Omega-3 Fatty Acids (FISH OIL ) 1000 MG CAPS Take 1 capsule by mouth every morning. 90 capsule 3   polyethylene glycol (MIRALAX / GLYCOLAX) 17 g packet Take 17 g by mouth daily as needed.     vitamin B-12 (CYANOCOBALAMIN ) 100 MCG tablet Take 100 mcg by mouth daily.     Zolpidem  Tartrate (AMBIEN  PO) Take 2.5 mg by mouth at bedtime.     Current Facility-Administered Medications on File Prior to Visit  Medication Dose Route Frequency Provider Last Rate Last Admin   denosumab  (PROLIA ) injection 60 mg  60 mg Subcutaneous Once Geofm Glade PARAS, MD         Review of Systems  Constitutional:  Negative for appetite change and fever.  HENT:  Positive for hearing loss, postnasal drip and voice change.   Respiratory:  Positive for cough (clearing throat). Negative for shortness of breath and wheezing.   Cardiovascular:  Positive for leg swelling.  Negative for chest pain and palpitations.  Gastrointestinal:        No gerd  Musculoskeletal:  Positive for arthralgias.  Neurological:  Negative for dizziness and headaches.  Psychiatric/Behavioral:  Negative for sleep disturbance.        Objective:   There were no vitals filed for this visit.  BP Readings from Last 3 Encounters:  05/01/23 120/68  04/24/21 140/72  11/23/20 138/64   Wt Readings from Last 3 Encounters:  05/01/23 126 lb (57.2 kg)  11/23/20 117 lb 12.8 oz (53.4 kg)  10/21/20 118 lb 3.2 oz (53.6 kg)   There is no height or weight on file to calculate BMI.    Physical Exam Constitutional:      General: She is not in acute distress.    Appearance: Normal appearance.  HENT:     Head: Normocephalic and atraumatic.     Right Ear: External ear normal. There is impacted cerumen.     Left Ear: External ear normal. There is impacted cerumen.  Eyes:     Conjunctiva/sclera: Conjunctivae normal.  Cardiovascular:     Rate and Rhythm: Normal rate and regular rhythm.     Heart sounds: Normal heart sounds.  Pulmonary:     Effort: Pulmonary effort is normal. No respiratory distress.     Breath sounds: Normal breath sounds. No wheezing.  Musculoskeletal:     Cervical back: Neck supple.     Right lower leg: No edema.     Left lower leg: No edema.  Lymphadenopathy:     Cervical: No cervical adenopathy.  Skin:    General: Skin is warm and dry.     Findings: No rash.  Neurological:     Mental Status: She is alert. Mental status is at baseline.  Psychiatric:        Mood and Affect: Mood normal.        Behavior: Behavior normal.        Lab Results  Component Value Date   WBC 6.2 04/24/2021   HGB 9.7 (L) 04/24/2021   HCT 30.1 (L) 04/24/2021   PLT 152.0 04/24/2021   GLUCOSE 153 (H) 04/24/2021   CHOL 166 04/24/2021   TRIG 76.0 04/24/2021   HDL 83.10 04/24/2021   LDLDIRECT 108.6 04/21/2013   LDLCALC 68 04/24/2021   ALT 15 04/24/2021   AST 20 04/24/2021   NA  137 04/24/2021   K 4.8 04/24/2021   CL 100 04/24/2021   CREATININE 1.65 (H) 04/24/2021   BUN 34 (H) 04/24/2021   CO2  28 04/24/2021   TSH 2.42 06/09/2019   INR 1.05 07/17/2016   HGBA1C 7.7 (A) 05/01/2023   HGBA1C 7.7 05/01/2023   HGBA1C 7.7 (A) 05/01/2023   HGBA1C 7.7 (A) 05/01/2023     Assessment & Plan:    See Problem List for Assessment and Plan of chronic medical problems.

## 2024-06-02 ENCOUNTER — Ambulatory Visit: Admitting: Internal Medicine

## 2024-06-02 DIAGNOSIS — M810 Age-related osteoporosis without current pathological fracture: Secondary | ICD-10-CM

## 2024-06-02 NOTE — Assessment & Plan Note (Deleted)
 Chronic Continue calcium  and vitamin d  Continue prolia  Q 6 months Prolia  injection today Blood work done at [facility] facility I will see her once a year

## 2024-06-10 NOTE — Telephone Encounter (Signed)
 Patient cleared for Prolia  at 10/20 appointment; Clinic supplied; $357 due at visit

## 2024-06-14 ENCOUNTER — Encounter: Payer: Self-pay | Admitting: Internal Medicine

## 2024-06-14 NOTE — Progress Notes (Unsigned)
 Subjective:    Patient ID: Patricia Andrews, female    DOB: May 20, 1926, 89 y.o.   MRN: [id number]     HPI Patricia Andrews is here for follow up of her chronic medical problems.  She is here with her daughter Patricia Andrews  She is following with the doctor at [facility].    OP -getting Prolia  shots every 6 months-up-to-date with injections  Having right knee pain.   - pain severe 10/10 - started suddenly.  Has been having right knee pain in the past - but never this bad.     ? Ear wax-has decreased hearing and a sensation of fullness in her ears.    Medications and allergies reviewed with patient and updated if appropriate.  Current Outpatient Medications on File Prior to Visit  Medication Sig Dispense Refill   acetaminophen  (TYLENOL ) 500 MG tablet Take 1 tablet (500 mg total) by mouth every 6 (six) hours as needed for mild pain. 90 tablet 5   atorvastatin  (LIPITOR) 20 MG tablet TAKE 1 TABLET BY MOUTH  DAILY 90 tablet 3   azithromycin (AZASITE) 1 % ophthalmic solution      bisacodyl  (DULCOLAX) 10 MG suppository Place 10 mg rectally as needed for moderate constipation.     citalopram  (CELEXA ) 20 MG tablet TAKE 1 TABLET BY MOUTH  DAILY 90 tablet 1   cromolyn (OPTICROM) 4 % ophthalmic solution Place 1 drop into both eyes 2 (two) times daily.     denosumab  (PROLIA ) 60 MG/ML SOLN injection Inject 60 mg into the skin every 6 (six) months. Administer in upper arm, thigh, or abdomen  Usually take on the 23rd of each month     diltiazem  (CARDIZEM  CD) 300 MG 24 hr capsule TAKE 1 CAPSULE BY MOUTH  DAILY 90 capsule 1   divalproex  (DEPAKOTE ) 250 MG DR tablet Take 250 mg by mouth every evening.     docusate sodium (COLACE) 100 MG capsule Take 100 mg by mouth 2 (two) times daily.     donepezil  (ARICEPT ) 10 MG tablet TAKE 1 TABLET BY MOUTH AT  BEDTIME 90 tablet 1   Glucerna (GLUCERNA) LIQD Take 237 mLs by mouth 2 (two) times daily.     glucose blood (ONETOUCH VERIO) test strip CHECK BLOOD SUGAR TWO TIMES  DAILY AS DIRECTED.  E11.9 (Patient taking differently: CHECK BLOOD SUGAR TWO TIMES DAILY AS DIRECTED.  E11.9) 200 strip 3   insulin  lispro (HUMALOG) 100 UNIT/ML KwikPen      Insulin  Pen Needle 32G X 4 MM MISC Use to administer insulin  four times a day Dx E11.9 30 each 0   Lancets (ONETOUCH DELICA PLUS LANCET33G) MISC USE WITH LANCING DEVICE TO  CHECK BLOOD SUGAR TWO TIMES DAILY AS DIRECTED. E11.9 (Patient taking differently: USE WITH LANCING DEVICE TO  CHECK BLOOD SUGAR TWO TIMES DAILY AS DIRECTED. E11.9) 200 each 3   LANTUS  SOLOSTAR 100 UNIT/ML Solostar Pen INJECT SUBCUTANEOUSLY 10  UNITS DAILY 15 mL 1   magnesium hydroxide (MILK OF MAGNESIA) 400 MG/5ML suspension Take by mouth daily as needed for mild constipation.     memantine  (NAMENDA ) 5 MG tablet TAKE 1 TABLET BY MOUTH  TWICE DAILY 180 tablet 3   mirtazapine  (REMERON ) 30 MG tablet Take 1 tablet (30 mg total) by mouth at bedtime. 90 tablet 1   Multiple Vitamin (MULTIVITAMIN) capsule Take 1 capsule by mouth daily. 90 capsule 3   olmesartan  (BENICAR ) 20 MG tablet TAKE 1 TABLET BY MOUTH  DAILY 90 tablet  3   Omega-3 Fatty Acids (FISH OIL ) 1000 MG CAPS Take 1 capsule by mouth every morning. 90 capsule 3   polyethylene glycol (MIRALAX / GLYCOLAX) 17 g packet Take 17 g by mouth daily as needed.     vitamin B-12 (CYANOCOBALAMIN ) 100 MCG tablet Take 100 mcg by mouth daily.     Zolpidem  Tartrate (AMBIEN  PO) Take 2.5 mg by mouth at bedtime.     Current Facility-Administered Medications on File Prior to Visit  Medication Dose Route Frequency Provider Last Rate Last Admin   denosumab  (PROLIA ) injection 60 mg  60 mg Subcutaneous Once Geofm Glade PARAS, MD         Review of Systems  Constitutional:  Negative for appetite change and fever.  HENT:  Positive for hearing loss, postnasal drip and voice change.   Respiratory:  Positive for cough (clearing throat). Negative for shortness of breath and wheezing.   Cardiovascular:  Positive for leg swelling.  Negative for chest pain and palpitations.  Gastrointestinal:        No gerd  Musculoskeletal:  Positive for arthralgias.  Neurological:  Negative for dizziness and headaches.  Psychiatric/Behavioral:  Negative for sleep disturbance.        Objective:   There were no vitals filed for this visit.  BP Readings from Last 3 Encounters:  05/01/23 120/68  04/24/21 140/72  11/23/20 138/64   Wt Readings from Last 3 Encounters:  05/01/23 126 lb (57.2 kg)  11/23/20 117 lb 12.8 oz (53.4 kg)  10/21/20 118 lb 3.2 oz (53.6 kg)   There is no height or weight on file to calculate BMI.    Physical Exam Constitutional:      General: She is not in acute distress.    Appearance: Normal appearance.  HENT:     Head: Normocephalic and atraumatic.     Right Ear: External ear normal. There is impacted cerumen.     Left Ear: External ear normal. There is impacted cerumen.  Eyes:     Conjunctiva/sclera: Conjunctivae normal.  Cardiovascular:     Rate and Rhythm: Normal rate and regular rhythm.     Heart sounds: Normal heart sounds.  Pulmonary:     Effort: Pulmonary effort is normal. No respiratory distress.     Breath sounds: Normal breath sounds. No wheezing.  Musculoskeletal:     Cervical back: Neck supple.     Right lower leg: No edema.     Left lower leg: No edema.  Lymphadenopathy:     Cervical: No cervical adenopathy.  Skin:    General: Skin is warm and dry.     Findings: No rash.  Neurological:     Mental Status: She is alert. Mental status is at baseline.  Psychiatric:        Mood and Affect: Mood normal.        Behavior: Behavior normal.        Lab Results  Component Value Date   WBC 6.2 04/24/2021   HGB 9.7 (L) 04/24/2021   HCT 30.1 (L) 04/24/2021   PLT 152.0 04/24/2021   GLUCOSE 153 (H) 04/24/2021   CHOL 166 04/24/2021   TRIG 76.0 04/24/2021   HDL 83.10 04/24/2021   LDLDIRECT 108.6 04/21/2013   LDLCALC 68 04/24/2021   ALT 15 04/24/2021   AST 20 04/24/2021   NA  137 04/24/2021   K 4.8 04/24/2021   CL 100 04/24/2021   CREATININE 1.65 (H) 04/24/2021   BUN 34 (H) 04/24/2021   CO2  28 04/24/2021   TSH 2.42 06/09/2019   INR 1.05 07/17/2016   HGBA1C 7.7 (A) 05/01/2023   HGBA1C 7.7 05/01/2023   HGBA1C 7.7 (A) 05/01/2023   HGBA1C 7.7 (A) 05/01/2023     Assessment & Plan:    See Problem List for Assessment and Plan of chronic medical problems.

## 2024-06-15 ENCOUNTER — Encounter: Payer: Self-pay | Admitting: Internal Medicine

## 2024-06-15 ENCOUNTER — Ambulatory Visit (INDEPENDENT_AMBULATORY_CARE_PROVIDER_SITE_OTHER): Admitting: Internal Medicine

## 2024-06-15 ENCOUNTER — Other Ambulatory Visit (HOSPITAL_COMMUNITY): Payer: Self-pay

## 2024-06-15 ENCOUNTER — Telehealth: Payer: Self-pay

## 2024-06-15 ENCOUNTER — Other Ambulatory Visit: Payer: Self-pay

## 2024-06-15 VITALS — BP 128/84 | HR 71 | Temp 98.2°F | Ht 61.0 in

## 2024-06-15 DIAGNOSIS — M810 Age-related osteoporosis without current pathological fracture: Secondary | ICD-10-CM

## 2024-06-15 DIAGNOSIS — I152 Hypertension secondary to endocrine disorders: Secondary | ICD-10-CM

## 2024-06-15 DIAGNOSIS — E1169 Type 2 diabetes mellitus with other specified complication: Secondary | ICD-10-CM | POA: Diagnosis not present

## 2024-06-15 DIAGNOSIS — F01A0 Vascular dementia, mild, without behavioral disturbance, psychotic disturbance, mood disturbance, and anxiety: Secondary | ICD-10-CM

## 2024-06-15 DIAGNOSIS — R490 Dysphonia: Secondary | ICD-10-CM | POA: Insufficient documentation

## 2024-06-15 DIAGNOSIS — E1159 Type 2 diabetes mellitus with other circulatory complications: Secondary | ICD-10-CM

## 2024-06-15 DIAGNOSIS — F418 Other specified anxiety disorders: Secondary | ICD-10-CM

## 2024-06-15 DIAGNOSIS — I69354 Hemiplegia and hemiparesis following cerebral infarction affecting left non-dominant side: Secondary | ICD-10-CM

## 2024-06-15 DIAGNOSIS — E785 Hyperlipidemia, unspecified: Secondary | ICD-10-CM

## 2024-06-15 MED ORDER — OMEPRAZOLE 20 MG PO CPDR: 20.0000 mg | DELAYED_RELEASE_CAPSULE | Freq: Every day | ORAL | 3 refills | Status: AC

## 2024-06-15 NOTE — Telephone Encounter (Signed)
 Patient due for Prolia , but may qualify for biosimilar. Ordering for updated PA and delivery of medication.

## 2024-06-15 NOTE — Assessment & Plan Note (Signed)
 Chronic Managed at [facility] Continue daily statin Continue exercise and healthy diet

## 2024-06-15 NOTE — Assessment & Plan Note (Signed)
 History of CVA 1980 with residual left-sided hemiparesis and poor balance Continue regular exercise

## 2024-06-15 NOTE — Assessment & Plan Note (Signed)
 Subacute Has been going on for a while Associated with throat clearing Never coughs anything up ?related to silent GERD Start omeprazole 20 mg daily - take 30 min prior to a meal Take at least for 8 weeks - may need to adjust dose / medication

## 2024-06-15 NOTE — Assessment & Plan Note (Signed)
Controlled, stable Continue current dose of medication  

## 2024-06-15 NOTE — Assessment & Plan Note (Signed)
 Chronic Controlled, stable Continue namenda  5 mg twice a day

## 2024-06-15 NOTE — Patient Instructions (Addendum)
    Prolia  injection given today     Medications changes include :   omeprazole 20 mg daily - take 30 minutes prior to a meal      Return in about 1 year (around 06/15/2025) for follow up.

## 2024-06-15 NOTE — Assessment & Plan Note (Signed)
 Chronic Managed at [facility] Well controlled when rechecked Current regimen effective and well tolerated Continue diltiazem  300 mg daily, olmesartan  20 mg daily

## 2024-06-15 NOTE — Assessment & Plan Note (Signed)
 Chronic Associated with hyperlipidemia  management per [facility] Lab Results  Component Value Date   HGBA1C 7.7 (A) 05/01/2023   HGBA1C 7.7 05/01/2023   HGBA1C 7.7 (A) 05/01/2023   HGBA1C 7.7 (A) 05/01/2023   Blood work done at [facility]

## 2024-06-15 NOTE — Assessment & Plan Note (Signed)
 Chronic Continue calcium  and vitamin d  Prolia  injection today Blood work done at [facility] facility I will see her once a year We have decided given age we may discontinue prolia  -- they will re-evaluate in 6 months
